# Patient Record
Sex: Female | Born: 1975 | Race: Black or African American | Hispanic: No | Marital: Single | Smoking: Current every day smoker
Health system: Southern US, Community
[De-identification: ages and names within clinical notes are randomized; demographics above are authoritative.]

## PROBLEM LIST (undated history)

## (undated) DIAGNOSIS — G43909 Migraine, unspecified, not intractable, without status migrainosus: Secondary | ICD-10-CM

## (undated) DIAGNOSIS — N879 Dysplasia of cervix uteri, unspecified: Secondary | ICD-10-CM

## (undated) DIAGNOSIS — E669 Obesity, unspecified: Secondary | ICD-10-CM

## (undated) DIAGNOSIS — G4730 Sleep apnea, unspecified: Secondary | ICD-10-CM

## (undated) DIAGNOSIS — F32A Depression, unspecified: Secondary | ICD-10-CM

## (undated) HISTORY — DX: Depression, unspecified: F32.A

## (undated) HISTORY — DX: Dysplasia of cervix uteri, unspecified: N87.9

## (undated) HISTORY — DX: Migraine, unspecified, not intractable, without status migrainosus: G43.909

## (undated) HISTORY — PX: COLPOSCOPY: SHX161

---
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
## 1997-04-10 ENCOUNTER — Encounter (INDEPENDENT_AMBULATORY_CARE_PROVIDER_SITE_OTHER): Payer: Self-pay | Admitting: *Deleted

## 1997-11-15 ENCOUNTER — Encounter: Admission: RE | Admit: 1997-11-15 | Discharge: 1997-11-15 | Payer: Self-pay | Admitting: Family Medicine

## 1997-12-20 ENCOUNTER — Encounter: Admission: RE | Admit: 1997-12-20 | Discharge: 1997-12-20 | Payer: Self-pay | Admitting: Family Medicine

## 1998-01-06 ENCOUNTER — Encounter: Admission: RE | Admit: 1998-01-06 | Discharge: 1998-01-06 | Payer: Self-pay | Admitting: Family Medicine

## 1998-01-11 ENCOUNTER — Encounter: Admission: RE | Admit: 1998-01-11 | Discharge: 1998-01-11 | Payer: Self-pay | Admitting: Family Medicine

## 1998-02-22 ENCOUNTER — Encounter: Admission: RE | Admit: 1998-02-22 | Discharge: 1998-02-22 | Payer: Self-pay | Admitting: *Deleted

## 1998-02-22 ENCOUNTER — Encounter: Payer: Self-pay | Admitting: Emergency Medicine

## 1998-02-22 ENCOUNTER — Emergency Department (HOSPITAL_COMMUNITY): Admission: EM | Admit: 1998-02-22 | Discharge: 1998-02-22 | Payer: Self-pay | Admitting: Emergency Medicine

## 1998-02-23 ENCOUNTER — Encounter: Admission: RE | Admit: 1998-02-23 | Discharge: 1998-05-24 | Payer: Self-pay | Admitting: Internal Medicine

## 1998-07-19 ENCOUNTER — Encounter: Admission: RE | Admit: 1998-07-19 | Discharge: 1998-07-19 | Payer: Self-pay | Admitting: Family Medicine

## 1998-08-02 ENCOUNTER — Encounter: Admission: RE | Admit: 1998-08-02 | Discharge: 1998-08-02 | Payer: Self-pay | Admitting: Family Medicine

## 1998-08-16 ENCOUNTER — Encounter: Admission: RE | Admit: 1998-08-16 | Discharge: 1998-08-16 | Payer: Self-pay | Admitting: Family Medicine

## 1998-09-13 ENCOUNTER — Encounter: Admission: RE | Admit: 1998-09-13 | Discharge: 1998-09-13 | Payer: Self-pay | Admitting: Family Medicine

## 1998-11-13 ENCOUNTER — Encounter: Payer: Self-pay | Admitting: Emergency Medicine

## 1998-11-13 ENCOUNTER — Emergency Department (HOSPITAL_COMMUNITY): Admission: EM | Admit: 1998-11-13 | Discharge: 1998-11-13 | Payer: Self-pay | Admitting: Emergency Medicine

## 1999-04-16 ENCOUNTER — Encounter: Admission: RE | Admit: 1999-04-16 | Discharge: 1999-04-16 | Payer: Self-pay | Admitting: Family Medicine

## 1999-04-26 ENCOUNTER — Encounter: Admission: RE | Admit: 1999-04-26 | Discharge: 1999-04-26 | Payer: Self-pay | Admitting: Family Medicine

## 1999-05-17 ENCOUNTER — Inpatient Hospital Stay (HOSPITAL_COMMUNITY): Admission: AD | Admit: 1999-05-17 | Discharge: 1999-05-17 | Payer: Self-pay | Admitting: Obstetrics & Gynecology

## 1999-05-22 ENCOUNTER — Inpatient Hospital Stay (HOSPITAL_COMMUNITY): Admission: AD | Admit: 1999-05-22 | Discharge: 1999-05-22 | Payer: Self-pay | Admitting: *Deleted

## 1999-05-22 ENCOUNTER — Encounter: Payer: Self-pay | Admitting: *Deleted

## 1999-06-12 ENCOUNTER — Emergency Department (HOSPITAL_COMMUNITY): Admission: EM | Admit: 1999-06-12 | Discharge: 1999-06-12 | Payer: Self-pay | Admitting: Emergency Medicine

## 2000-02-07 ENCOUNTER — Encounter: Payer: Self-pay | Admitting: Emergency Medicine

## 2000-02-07 ENCOUNTER — Emergency Department (HOSPITAL_COMMUNITY): Admission: EM | Admit: 2000-02-07 | Discharge: 2000-02-07 | Payer: Self-pay | Admitting: Emergency Medicine

## 2000-03-17 ENCOUNTER — Emergency Department (HOSPITAL_COMMUNITY): Admission: EM | Admit: 2000-03-17 | Discharge: 2000-03-17 | Payer: Self-pay | Admitting: *Deleted

## 2000-09-01 ENCOUNTER — Emergency Department (HOSPITAL_COMMUNITY): Admission: EM | Admit: 2000-09-01 | Discharge: 2000-09-01 | Payer: Self-pay | Admitting: Emergency Medicine

## 2000-11-20 ENCOUNTER — Encounter: Admission: RE | Admit: 2000-11-20 | Discharge: 2000-11-20 | Payer: Self-pay | Admitting: Family Medicine

## 2001-04-20 ENCOUNTER — Emergency Department (HOSPITAL_COMMUNITY): Admission: EM | Admit: 2001-04-20 | Discharge: 2001-04-20 | Payer: Self-pay | Admitting: *Deleted

## 2001-04-20 ENCOUNTER — Encounter: Payer: Self-pay | Admitting: Emergency Medicine

## 2001-04-28 ENCOUNTER — Emergency Department (HOSPITAL_COMMUNITY): Admission: EM | Admit: 2001-04-28 | Discharge: 2001-04-28 | Payer: Self-pay

## 2001-05-01 ENCOUNTER — Other Ambulatory Visit: Admission: RE | Admit: 2001-05-01 | Discharge: 2001-05-01 | Payer: Self-pay | Admitting: Family Medicine

## 2001-06-15 ENCOUNTER — Encounter: Admission: RE | Admit: 2001-06-15 | Discharge: 2001-06-15 | Payer: Self-pay | Admitting: Family Medicine

## 2001-12-10 ENCOUNTER — Encounter: Admission: RE | Admit: 2001-12-10 | Discharge: 2001-12-10 | Payer: Self-pay | Admitting: Family Medicine

## 2002-01-12 ENCOUNTER — Encounter: Admission: RE | Admit: 2002-01-12 | Discharge: 2002-01-12 | Payer: Self-pay | Admitting: Sports Medicine

## 2002-07-16 ENCOUNTER — Encounter: Admission: RE | Admit: 2002-07-16 | Discharge: 2002-07-16 | Payer: Self-pay | Admitting: Family Medicine

## 2003-06-12 ENCOUNTER — Inpatient Hospital Stay (HOSPITAL_COMMUNITY): Admission: AD | Admit: 2003-06-12 | Discharge: 2003-06-12 | Payer: Self-pay | Admitting: *Deleted

## 2003-09-03 ENCOUNTER — Inpatient Hospital Stay (HOSPITAL_COMMUNITY): Admission: AD | Admit: 2003-09-03 | Discharge: 2003-09-03 | Payer: Self-pay | Admitting: Obstetrics and Gynecology

## 2003-09-13 ENCOUNTER — Ambulatory Visit (HOSPITAL_COMMUNITY): Admission: RE | Admit: 2003-09-13 | Discharge: 2003-09-13 | Payer: Self-pay | Admitting: Obstetrics & Gynecology

## 2003-09-15 ENCOUNTER — Inpatient Hospital Stay (HOSPITAL_COMMUNITY): Admission: AD | Admit: 2003-09-15 | Discharge: 2003-09-15 | Payer: Self-pay | Admitting: Obstetrics & Gynecology

## 2003-09-26 ENCOUNTER — Inpatient Hospital Stay (HOSPITAL_COMMUNITY): Admission: AD | Admit: 2003-09-26 | Discharge: 2003-09-26 | Payer: Self-pay | Admitting: Obstetrics and Gynecology

## 2003-10-18 ENCOUNTER — Inpatient Hospital Stay (HOSPITAL_COMMUNITY): Admission: AD | Admit: 2003-10-18 | Discharge: 2003-10-21 | Payer: Self-pay | Admitting: Obstetrics

## 2003-11-03 ENCOUNTER — Inpatient Hospital Stay (HOSPITAL_COMMUNITY): Admission: RE | Admit: 2003-11-03 | Discharge: 2003-11-06 | Payer: Self-pay | Admitting: Obstetrics

## 2003-11-03 ENCOUNTER — Encounter (INDEPENDENT_AMBULATORY_CARE_PROVIDER_SITE_OTHER): Payer: Self-pay | Admitting: Specialist

## 2004-06-10 HISTORY — PX: TUBAL LIGATION: SHX77

## 2005-06-10 HISTORY — PX: UMBILICAL HERNIA REPAIR: SHX196

## 2005-07-21 ENCOUNTER — Emergency Department (HOSPITAL_COMMUNITY): Admission: EM | Admit: 2005-07-21 | Discharge: 2005-07-21 | Payer: Self-pay | Admitting: *Deleted

## 2005-12-22 ENCOUNTER — Emergency Department (HOSPITAL_COMMUNITY): Admission: EM | Admit: 2005-12-22 | Discharge: 2005-12-22 | Payer: Self-pay | Admitting: Family Medicine

## 2006-01-16 ENCOUNTER — Emergency Department (HOSPITAL_COMMUNITY): Admission: EM | Admit: 2006-01-16 | Discharge: 2006-01-16 | Payer: Self-pay | Admitting: Emergency Medicine

## 2006-03-25 ENCOUNTER — Emergency Department (HOSPITAL_COMMUNITY): Admission: EM | Admit: 2006-03-25 | Discharge: 2006-03-25 | Payer: Self-pay | Admitting: Family Medicine

## 2006-03-27 ENCOUNTER — Ambulatory Visit: Payer: Self-pay | Admitting: Family Medicine

## 2006-04-16 ENCOUNTER — Ambulatory Visit (HOSPITAL_COMMUNITY): Admission: RE | Admit: 2006-04-16 | Discharge: 2006-04-16 | Payer: Self-pay | Admitting: General Surgery

## 2006-05-07 ENCOUNTER — Inpatient Hospital Stay (HOSPITAL_COMMUNITY): Admission: RE | Admit: 2006-05-07 | Discharge: 2006-05-09 | Payer: Self-pay | Admitting: *Deleted

## 2006-08-07 DIAGNOSIS — F17200 Nicotine dependence, unspecified, uncomplicated: Secondary | ICD-10-CM | POA: Insufficient documentation

## 2006-08-07 DIAGNOSIS — G43909 Migraine, unspecified, not intractable, without status migrainosus: Secondary | ICD-10-CM | POA: Insufficient documentation

## 2006-08-07 DIAGNOSIS — J309 Allergic rhinitis, unspecified: Secondary | ICD-10-CM | POA: Insufficient documentation

## 2006-08-08 ENCOUNTER — Encounter (INDEPENDENT_AMBULATORY_CARE_PROVIDER_SITE_OTHER): Payer: Self-pay | Admitting: *Deleted

## 2006-08-15 ENCOUNTER — Telehealth: Payer: Self-pay | Admitting: Family Medicine

## 2006-08-26 ENCOUNTER — Emergency Department (HOSPITAL_COMMUNITY): Admission: EM | Admit: 2006-08-26 | Discharge: 2006-08-26 | Payer: Self-pay | Admitting: Emergency Medicine

## 2006-11-15 ENCOUNTER — Emergency Department (HOSPITAL_COMMUNITY): Admission: EM | Admit: 2006-11-15 | Discharge: 2006-11-15 | Payer: Self-pay | Admitting: Family Medicine

## 2006-11-17 ENCOUNTER — Telehealth: Payer: Self-pay | Admitting: *Deleted

## 2006-11-19 ENCOUNTER — Ambulatory Visit: Payer: Self-pay | Admitting: Family Medicine

## 2006-11-19 ENCOUNTER — Telehealth: Payer: Self-pay | Admitting: *Deleted

## 2007-02-12 ENCOUNTER — Emergency Department (HOSPITAL_COMMUNITY): Admission: EM | Admit: 2007-02-12 | Discharge: 2007-02-12 | Payer: Self-pay | Admitting: Emergency Medicine

## 2007-08-15 ENCOUNTER — Emergency Department (HOSPITAL_COMMUNITY): Admission: EM | Admit: 2007-08-15 | Discharge: 2007-08-15 | Payer: Self-pay | Admitting: Family Medicine

## 2007-08-15 ENCOUNTER — Telehealth (INDEPENDENT_AMBULATORY_CARE_PROVIDER_SITE_OTHER): Payer: Self-pay | Admitting: *Deleted

## 2007-08-20 ENCOUNTER — Telehealth: Payer: Self-pay | Admitting: *Deleted

## 2007-08-20 ENCOUNTER — Ambulatory Visit: Payer: Self-pay | Admitting: Sports Medicine

## 2007-08-20 ENCOUNTER — Encounter: Payer: Self-pay | Admitting: Family Medicine

## 2007-08-20 LAB — CONVERTED CEMR LAB: Chlamydia, Swab/Urine, PCR: NEGATIVE

## 2007-08-24 ENCOUNTER — Ambulatory Visit: Payer: Self-pay | Admitting: Family Medicine

## 2007-08-24 ENCOUNTER — Telehealth: Payer: Self-pay | Admitting: *Deleted

## 2007-08-24 LAB — CONVERTED CEMR LAB
Ketones, urine, test strip: NEGATIVE
Nitrite: NEGATIVE
Protein, U semiquant: 30
Urobilinogen, UA: 1
pH: 6.5

## 2007-12-14 ENCOUNTER — Emergency Department (HOSPITAL_COMMUNITY): Admission: EM | Admit: 2007-12-14 | Discharge: 2007-12-14 | Payer: Self-pay | Admitting: Emergency Medicine

## 2008-05-01 ENCOUNTER — Emergency Department (HOSPITAL_COMMUNITY): Admission: EM | Admit: 2008-05-01 | Discharge: 2008-05-01 | Payer: Self-pay | Admitting: Emergency Medicine

## 2008-11-22 ENCOUNTER — Emergency Department (HOSPITAL_COMMUNITY): Admission: EM | Admit: 2008-11-22 | Discharge: 2008-11-22 | Payer: Self-pay | Admitting: Emergency Medicine

## 2010-05-31 ENCOUNTER — Encounter: Payer: Self-pay | Admitting: Family Medicine

## 2010-07-12 NOTE — Miscellaneous (Signed)
   Clinical Lists Changes  Problems: Removed problem of TRICHOMONAL INFECTION (ICD-131.9) Removed problem of ABDOMINAL PAIN (ICD-789.00) Removed problem of LOW BACK PAIN, ACUTE (ICD-724.2) Removed problem of UNSPECIFIED UROGENITAL TRICHOMONIASIS (ICD-131.00) Removed problem of UTI (ICD-599.0) Removed problem of SEXUALLY TRANSMITTED DISEASE, EXPOSURE TO (ICD-V01.6) Removed problem of DYSURIA (ICD-788.1)

## 2010-08-01 ENCOUNTER — Encounter: Payer: Self-pay | Admitting: *Deleted

## 2010-09-17 LAB — POCT CARDIAC MARKERS
CKMB, poc: 1.7 ng/mL (ref 1.0–8.0)
Troponin i, poc: <0.05 ng/mL (ref 0.00–0.09)
Troponin i, poc: <0.05 ng/mL (ref 0.00–0.09)

## 2010-09-17 LAB — POCT I-STAT, CHEM 8
BUN: 7 mg/dL (ref 6–23)
Glucose, Bld: 92 mg/dL (ref 70–99)
HCT: 36 % (ref 36.0–46.0)
Potassium: 4 mEq/L (ref 3.5–5.1)
Sodium: 140 mEq/L (ref 135–145)

## 2010-10-08 ENCOUNTER — Inpatient Hospital Stay (INDEPENDENT_AMBULATORY_CARE_PROVIDER_SITE_OTHER)
Admission: RE | Admit: 2010-10-08 | Discharge: 2010-10-08 | Disposition: A | Discharge status: Home / Self Care | Payer: Worker's Compensation | Source: Ambulatory Visit | Attending: Family Medicine | Admitting: Family Medicine

## 2010-10-08 ENCOUNTER — Ambulatory Visit (INDEPENDENT_AMBULATORY_CARE_PROVIDER_SITE_OTHER): Payer: Worker's Compensation

## 2010-10-08 DIAGNOSIS — S8000XA Contusion of unspecified knee, initial encounter: Secondary | ICD-10-CM

## 2010-10-08 DIAGNOSIS — R609 Edema, unspecified: Secondary | ICD-10-CM

## 2010-10-26 NOTE — Discharge Summary (Signed)
NAME:  [name], [name]                          ACCOUNT NO.:  [id number]   MEDICAL RECORD NO.:  [id number]                   PATIENT TYPE:  INP   LOCATION:  9157                                 FACILITY:  [facility]   PHYSICIAN:  Charles A. Clearance Coots, M.D.             DATE OF BIRTH:  07-Mar-1976   DATE OF ADMISSION:  10/18/2003  DATE OF DISCHARGE:  10/21/2003                                 DISCHARGE SUMMARY   ADMISSION DATE:  Oct 18, 2003   ATTENDING PHYSICIAN:  Dr. Coral Ceo.   ADMISSION DIAGNOSES:  Thirty-six weeks gestation with chest pain, probable  musculoskeletal etiology.   DISCHARGE DATE:  Oct 21, 2003   ATTENDING PHYSICIAN ON DAY OF DISCHARGE:  Dr. Coral Ceo.   DISCHARGE DIAGNOSES:  1. Thirty-six weeks gestation.  2. Resolved chest pain.  3. Gallbladder etiology versus musculoskeletal pain.   PATIENT PRESENTATION:  The patient presents to the maternity admissions unit  with complaint of back pain and mid chest pain.  The patient had been seen  earlier for a maternity admissions visit for the same symptoms and received  treatment with a GI cocktail and resolution, with now return of symptoms and  pain.  Physical examination:  Lungs are clear, heart rate is regular rate  and rhythm.  Abdomen is soft, gravid, and nontender.  EKG reveals normal  sinus rhythm with no ectopy.  The patient is admitted for observation and  supportive care.   PATIENT HISTORY:  The patient is a 35 year old gravida 5 para 2 with an EDC  of November 12, 2003 at [redacted] weeks gestation.  The patient has been receiving  prenatal care at the [facility].  Prenatal history includes  cesarean delivery x2 for two term pregnancies.  The patient has a history of  acid reflux disease and constipation.  The patient has also undergone  cryotherapy for abnormal Pap smears.  The patient has no known drug  allergies.  The patient's current medications include Nexium 40 mg daily,  Ambien 10 mg nightly as  needed, and a prenatal vitamin one tablet daily.   HOSPITAL COURSE:  Hospital admission on day #1 includes lab work and  supportive care.  EKG is within normal sinus rhythm.  Lab work on the day of  admission:  Hemoglobin is 9.3, hematocrit 27.7, platelet count of 250, white  blood cell count of 7.5.  The patient had CMET which was within normal  limits.  Repeat lab work on hospital day #2 with repeat CMET, amylase, and  lipase.  CMET is still within normal limits with liver enzymes normal,  amylase is within  normal limits and lipase is within normal limits.  The  patient had a normal urinalysis.  The patient underwent obstetrical  ultrasound which was within normal limits with a gross estimate of 25th to  50th percentile, normal fluid, good cardiac activity, with a cephalic  presentation and  no evidence of labor.  The patient was continued on Nexium  was given Tylox p.r.n. for pain.  The patient underwent a gallbladder  ultrasound on hospital day #2 which was within normal limits.  The patient  had nonstress tests during hospitalization which revealed a reactive  nonstress test.  Hospital day of discharge - Oct 21, 2003 - the patient is  without pain or vomiting x24 hours.  The patient is tolerating p.o. diet  without any difficulty.  The patient is discharged home in stable condition  with resolved pain.   DISCHARGE INSTRUCTIONS:  1. Continue on a low fat at home.  2. Routine medications are to be continued of the prenatal vitamin once     daily, Nexium 40 mg daily.  3. Activity is ad lib.  4. The patient is to follow up with the [facility] in 1 week as     scheduled or to contact our office with any return of symptoms prior to     follow-up visit.     Marlinda Mike, C.N.M.                      Charles A. Clearance Coots, M.D.    TB/MEDQ  D:  11/30/2003  T:  12/02/2003  Job:  [id number]

## 2010-10-26 NOTE — Op Note (Signed)
[name], [name]                ACCOUNT NO.:  [id number]   MEDICAL RECORD NO.:  [id number]          PATIENT TYPE:  OIB   LOCATION:  9629                         FACILITY:  [facility]   PHYSICIAN:  Alfonse Ras, MD   DATE OF BIRTH:  1975/12/09   DATE OF PROCEDURE:  DATE OF DISCHARGE:                               OPERATIVE REPORT   PREOPERATIVE DIAGNOSIS:  Incisional ventral hernia.   POSTOPERATIVE DIAGNOSIS:  Incisional ventral hernia with extensive intra-  abdominal adhesions.   PROCEDURE:  Laparoscopic assisted ventral hernia repair with open  placement of mesh.   SURGEON:  Alfonse Ras, M.D.   ASSISTANT:  Gita Kudo, M.D.   ANESTHESIA:  General.   DESCRIPTION:  The patient was taken operating room, placed in supine  position. After adequate anesthesia was induced using endotracheal tube  the abdomen is prepped and draped in normal sterile fashion. Using a  left upper quadrant 11 mm incision, trocar was placed under direct  vision.  Pneumoperitoneum was obtained under direct vision.  Additional  5 mm trocars were placed under direct vision.  A number of omental and  small bowel adhesions were taken down from the anterior abdominal wall  and reduced through the sac.  There was one area of what appeared to be  terminal ileum which was quite plastered to the pubic tubercle.  I felt  that was unsafe to dissect.  For that reason, I could not place intra-  abdominal tacks and after all adhesions were taken down except for the  one against the pelvic rim, I opted to open over the hernia sac.  The  hernia sac was excised, fascial edges were identified and flaps were  made around them and they were closed with interrupted figure-of-eight  #1 Novofil sutures.  A piece of onlay Bard mesh was then placed intact  using running 2-0 Prolene suture extending 2-3 cm away from the primary  repair.  A JP drain was placed because of the patient's size and  significant risk for  seroma.  Trocars were removed and skin incisions  were closed with staples.  The patient tolerated the procedure well and  went to PACU in good condition.      Alfonse Ras, MD  Electronically Signed     KRE/MEDQ  D:  05/06/2006  T:  05/06/2006  Job:  [id number]

## 2010-10-26 NOTE — Op Note (Signed)
[name], [name]                          ACCOUNT NO.:  [id number]   MEDICAL RECORD NO.:  [id number]                   PATIENT TYPE:  INP   LOCATION:  9116                                 FACILITY:  [facility]   PHYSICIAN:  Charles A. Clearance Coots, M.D.             DATE OF BIRTH:  1975/10/06   DATE OF PROCEDURE:  11/03/2003  DATE OF DISCHARGE:                                 OPERATIVE REPORT   PREOPERATIVE DIAGNOSIS:  Previous cesarean section x 2, desires repeat  cesarean section.   POSTOPERATIVE DIAGNOSIS:  Previous cesarean section x 2, desires repeat  cesarean section.   PROCEDURE:  Repeat low transverse cesarean section.   SURGEON:  Charles A. Clearance Coots, M.D.   ASSISTANT:  Kathreen Cosier, M.D.   ANESTHESIA:  Spinal.   ESTIMATED BLOOD LOSS:  1100 mL.   FLUIDS REPLACED:  5000 mL.   URINE OUTPUT:  150 mL clear.   COMPLICATIONS:  None.   DRAINS:  Foley catheter to gravity.   FINDINGS:  Viable female at 0920, Apgars 9 at 1 minute and 9 at 5 minutes,  weight 5 pounds 13 ounces.  Normal uterus, ovaries, and fallopian tubes.   OPERATION:  The patient was brought to the operating room and after  satisfactory spinal anesthesia, the abdomen was prepped and draped in the  usual sterile fashion.  A Pfannenstiel skin incision was made with the  scalpel through the previous scar down to the fascia.  The fascia was nicked  in the midline and the fascial incision was extended to the left and the  right with curved Mayo scissors.  The superior and inferior fascial edges  were taken off the rectus muscles with blunt and sharp dissection.  The  rectus muscles were bluntly and sharply divided in the midline.  The  peritoneum was entered digitally and was digitally extended to the left and  the right.  The bladder blade was positioned and the vesicouterine fold of  peritoneum above the reflection of the urinary bladder was grasped with  forceps and was incised and undermined with Metzenbaum  scissors.  The  incision was extended to the left and to the right with Metzenbaum scissors.  The bladder flap was bluntly developed and the bladder blade was  repositioned from the urinary bladder placing it well out of the operative  field.  The uterus was entered transversely in the lower uterine segment  with a scalpel down to the amniotic sac.  The uterine incision was extended  to the left and to the right with bandage scissors.  The amniotic sac was  ruptured and clear fluid was expelled.  The vertex was noted to be  hyperextended and was, therefore, flexed into the incision but still not  delivered.  A mushroom Mityvac was then used for vacuum extraction and the  mushroom cup was applied to the occiput and the occiput was further flexed  into  the incision and the delivery was completed with the aid of fundal  pressure from the assistant.  The infant's mouth and nose were suctioned  with a suction bulb and the remainder of the delivery was completed with the  aid of fundal pressure from the assistant.  The umbilical cord was doubly  clamped and cut and the infant was handed off the nursery staff.  Cord blood  was obtained and the placenta was spontaneously expelled from the uterine  cavity intact.  The endometrial surface was thoroughly debrided with a dry  lap sponge and the edges of the uterine incision were grasped with ring  forceps.  The uterus was closed with a continuous interlocking suture of 0  Monocryl single layer.  Hemostasis was excellent.  The pelvic cavity was  thoroughly irrigated with warm saline solution and all clots were removed.  The abdomen was then closed as follows:  The peritoneum was closed with a  continuous suture of 2-0 Monocryl.  The fascia was closed with continuous  suture of 0 Vicryl from each corner to the center.  The subcutaneous tissue  was thoroughly irrigated with warm, saline solution and all areas of  subcutaneous bleeding was coagulated with  the Bovie.  The subcutaneous  tissue was reapproximated with continuous suture of 2-0 Monocryl.  The skin  was then closed with a continuous subcuticular suture of 3-0 Monocryl.  A  sterile bandage was applied to the incision closure.  The surgical  technician indicated that all needle, sponge, and instrument counts were  correct x 2.  The patient tolerated the procedure well and was transferred  to the recovery room in satisfactory condition.                                               Charles A. Clearance Coots, M.D.    CAH/MEDQ  D:  11/03/2003  T:  11/03/2003  Job:  [id number]

## 2010-10-26 NOTE — Discharge Summary (Signed)
[name], [name]                          ACCOUNT NO.:  [id number]   MEDICAL RECORD NO.:  [id number]                   PATIENT TYPE:  INP   LOCATION:  9116                                 FACILITY:  [facility]   PHYSICIAN:  Charles A. Clearance Coots, M.D.             DATE OF BIRTH:  1976-05-23   DATE OF ADMISSION:  11/03/2003  DATE OF DISCHARGE:                                 DISCHARGE SUMMARY   ADMITTING DIAGNOSIS:  Previous cesarean section x2, desired repeat cesarean  section.   DISCHARGE DIAGNOSES:  1. Previous cesarean section x2, desired repeat cesarean section.  2. Status post repeat low transverse cesarean section.  Delivered a viable     female on Nov 03, 2003 at 0920; Apgars of 9 at one minute and 9 at five     minutes; weight of 5 pounds 13 ounces.  Mother and infant discharged home     in good condition.   REASON FOR ADMISSION:  A 35 year old black female G5 P2-0-2-2 with estimated  date of confinement of November 12, 2003.  History of two previous cesarean  sections; desired repeat cesarean section.   PAST MEDICAL HISTORY:  Surgery:  Cesarean section x2.  Illnesses:  None.   MEDICATIONS:  Prenatal vitamins and iron.   ALLERGIES:  No known drug allergies.   SOCIAL HISTORY:  Single.  Negative tobacco, alcohol, or recreational drug  use.   FAMILY HISTORY:  Noncontributory.   PHYSICAL EXAMINATION:  GENERAL APPEARANCE:  Obese black female in no acute  distress.  VITAL SIGNS:  Afebrile, vital signs are stable.  LUNGS:  Clear to auscultation bilaterally.  HEART:  Regular rate and rhythm.  ABDOMEN:  Obese, gravid, nontender.  PELVIC:  Normal external female genitalia, vaginal mucosa normal.  The  cervix is long and closed.  EXTREMITIES:  Without cyanosis, clubbing, or edema.   ADMITTING LABORATORY VALUES:  Hemoglobin 9.9; hematocrit 29.9; white blood  cell count 7800; platelets 269,000.  Hepatitis B surface antigen was  negative.  RPR was nonreactive.  Rubella immune.   HOSPITAL COURSE:  The patient underwent a repeat low transverse cesarean  section on Nov 03, 2003.  There were no intraoperative complications.  Postpartum and postoperative course were uncomplicated.  The patient did  have anemia postpartum but did not have any orthostatic changes.  Her  baseline hemoglobin was low and the patient had had anemia during her  pregnancy.  She was therefore discharged home on postoperative day #3 in  good condition.   DISCHARGE LABORATORY VALUES:  Hemoglobin 6.9, hematocrit 21.   DISCHARGE DISPOSITION:  1. Medications:  Tylox and ibuprofen prescribed for pain; continue prenatal     vitamins and iron.  2. Routine written obstetrical discharge instructions were given per     booklet.  3. The patient is to call the office for follow-up in 6 weeks.  Charles A. Clearance Coots, M.D.    CAH/MEDQ  D:  11/06/2003  T:  11/06/2003  Job:  [id number]

## 2011-03-04 LAB — POCT URINALYSIS DIP (DEVICE)
Glucose, UA: 100 — AB
Protein, ur: 100 — AB
Urobilinogen, UA: 2 — ABNORMAL HIGH
pH: 5.5

## 2011-03-04 LAB — POCT PREGNANCY, URINE
Operator id: 247071
Preg Test, Ur: NEGATIVE

## 2011-03-04 LAB — URINE CULTURE

## 2011-07-15 ENCOUNTER — Emergency Department (HOSPITAL_COMMUNITY)
Admission: EM | Admit: 2011-07-15 | Discharge: 2011-07-16 | Disposition: A | Discharge status: Home / Self Care | Payer: 59 | Attending: Emergency Medicine | Admitting: Emergency Medicine

## 2011-07-15 ENCOUNTER — Encounter (HOSPITAL_COMMUNITY): Payer: Self-pay | Admitting: Emergency Medicine

## 2011-07-15 DIAGNOSIS — R42 Dizziness and giddiness: Secondary | ICD-10-CM | POA: Insufficient documentation

## 2011-07-15 DIAGNOSIS — R110 Nausea: Secondary | ICD-10-CM | POA: Insufficient documentation

## 2011-07-15 DIAGNOSIS — F17200 Nicotine dependence, unspecified, uncomplicated: Secondary | ICD-10-CM | POA: Insufficient documentation

## 2011-07-15 DIAGNOSIS — J329 Chronic sinusitis, unspecified: Secondary | ICD-10-CM | POA: Insufficient documentation

## 2011-07-15 DIAGNOSIS — R51 Headache: Secondary | ICD-10-CM | POA: Insufficient documentation

## 2011-07-15 HISTORY — DX: Obesity, unspecified: E66.9

## 2011-07-15 NOTE — ED Notes (Signed)
PT. REPORTS PROGRESSING HEADACHE FOR 3 WEEKS UNRELIEVED BY OTC PAIN MEDICATIONS.

## 2011-07-16 ENCOUNTER — Emergency Department (HOSPITAL_COMMUNITY): Payer: 59

## 2011-07-16 MED ORDER — DEXAMETHASONE SODIUM PHOSPHATE 10 MG/ML IJ SOLN
10.0000 mg | Freq: Once | INTRAMUSCULAR | Status: AC
  Administered 2011-07-16: 10 mg via INTRAMUSCULAR
  Filled 2011-07-16: qty 1

## 2011-07-16 MED ORDER — KETOROLAC TROMETHAMINE 60 MG/2ML IM SOLN
60.0000 mg | Freq: Once | INTRAMUSCULAR | Status: AC
  Administered 2011-07-16: 60 mg via INTRAMUSCULAR
  Filled 2011-07-16: qty 2

## 2011-07-16 MED ORDER — PSEUDOEPHEDRINE HCL 30 MG PO TABS: 30.0000 mg | ORAL_TABLET | ORAL | Status: AC | PRN

## 2011-07-16 NOTE — ED Provider Notes (Signed)
Medical screening examination/treatment/procedure(s) were performed by non-physician practitioner and as supervising physician I was immediately available for consultation/collaboration.   Juliet Rude. Rubin Payor, MD 07/16/11 [phone]

## 2011-07-16 NOTE — ED Provider Notes (Signed)
History     [name]: [id number]  Arrival date & time 07/15/11  2305   First MD Initiated Contact with Patient 07/16/11 0028      Chief Complaint  Patient presents with  . Migraine    (Consider location/radiation/quality/duration/timing/severity/associated sxs/prior treatment) HPI Comments: Patient here with headache for the past 3 weeks - she reports that the pain has been constant - that it is across her entire forehead, worsening with bright lights, reports no blurring of vision, nausea or vomiting - denies fever, chills, shortness of breath, chest pain.  States that her sister died recently and all she complained about was headaches, though she does not know how her sister died.  She has tried OTC medications without relief.  Patient is a 36 y.o. female presenting with migraine. The history is provided by the patient. No language interpreter was used.  Migraine This is a new problem. The current episode started 1 to 4 weeks ago. The problem occurs constantly. The problem has been unchanged. Associated symptoms include headaches. Pertinent negatives include no abdominal pain, anorexia, arthralgias, chest pain, chills, congestion, coughing, diaphoresis, fatigue, fever, joint swelling, myalgias, nausea, neck pain, numbness, rash, sore throat, vertigo, visual change, vomiting or weakness. Exacerbated by: bright light. She has tried acetaminophen and NSAIDs for the symptoms. The treatment provided no relief.    Past Medical History  Diagnosis Date  . Obesity     Past Surgical History  Procedure Date  . Cesarean section     No family history on file.  History  Substance Use Topics  . Smoking status: Current Everyday Smoker  . Smokeless tobacco: Not on file  . Alcohol Use: No    OB History    Grav Para Term Preterm Abortions TAB SAB Ect Mult Living                  Review of Systems  Constitutional: Negative for fever, chills, diaphoresis and fatigue.  HENT: Negative for  congestion, sore throat and neck pain.   Respiratory: Negative for cough.   Cardiovascular: Negative for chest pain.  Gastrointestinal: Negative for nausea, vomiting, abdominal pain and anorexia.  Musculoskeletal: Negative for myalgias, joint swelling and arthralgias.  Skin: Negative for rash.  Neurological: Positive for headaches. Negative for vertigo, weakness and numbness.  All other systems reviewed and are negative.    Allergies  Morphine and related  Home Medications   Current Outpatient Rx  Name Route Sig Dispense Refill  . ADULT MULTIVITAMIN W/MINERALS CH Oral Take 1 tablet by mouth daily.      BP 100/64  Pulse 83  Temp(Src) 98.3 F (36.8 C) (Oral)  Resp 20  SpO2 100%  LMP 07/14/2011  Physical Exam  Nursing note and vitals reviewed. Constitutional: She is oriented to person, place, and time. She appears well-developed and well-nourished. No distress.  HENT:  Head: Normocephalic and atraumatic.  Right Ear: External ear normal.  Left Ear: External ear normal.  Nose: Nose normal.  Mouth/Throat: Oropharynx is clear and moist. No oropharyngeal exudate.  Eyes: Conjunctivae are normal. Pupils are equal, round, and reactive to light. No scleral icterus.  Neck: Normal range of motion. Neck supple.  Cardiovascular: Normal rate, regular rhythm and normal heart sounds.  Exam reveals no gallop and no friction rub.   No murmur heard. Pulmonary/Chest: Effort normal and breath sounds normal. No respiratory distress. She exhibits no tenderness.  Abdominal: Soft. Bowel sounds are normal. She exhibits no distension. There is no tenderness.  Musculoskeletal: Normal  range of motion. She exhibits no edema and no tenderness.  Lymphadenopathy:    She has no cervical adenopathy.  Neurological: She is alert and oriented to person, place, and time. She has normal reflexes. No cranial nerve deficit. She exhibits normal muscle tone. Coordination normal.  Skin: Skin is warm and dry. No rash  noted. No erythema. No pallor.  Psychiatric: She has a normal mood and affect. Her behavior is normal. Judgment and thought content normal.    ED Course  Procedures (including critical care time)  Labs Reviewed - No data to display No results found. Results for orders placed during the hospital encounter of 11/22/08  POCT I-STAT, CHEM 8      Component Value Range   Sodium 140  135 - 145 (mEq/L)   Potassium 4.0  3.5 - 5.1 (mEq/L)   Chloride 106  96 - 112 (mEq/L)   BUN 7  6 - 23 (mg/dL)   Creatinine, Ser 0.9  0.4 - 1.2 (mg/dL)   Glucose, Bld 92  70 - 99 (mg/dL)   Calcium, Ion 4.09 (*) 1.12 - 1.32 (mmol/L)   TCO2 22  0 - 100 (mmol/L)   Hemoglobin 12.2  12.0 - 15.0 (g/dL)   HCT 81.1  91.4 - 78.2 (%)  POCT CARDIAC MARKERS      Component Value Range   Myoglobin, poc 116  12 - 200 (ng/mL)   CKMB, poc 1.3  1.0 - 8.0 (ng/mL)   Troponin i, poc <0.05  0.00 - 0.09 (ng/mL)   Comment       Value:            TROPONIN VALUES IN THE RANGE     OF 0.00-0.09 ng/mL SHOW     NO INDICATION OF     MYOCARDIAL INJURY.                PERSISTENTLY INCREASED TROPONIN     VALUES IN THE RANGE OF 0.10-0.24     ng/mL CAN BE SEEN IN:           -UNSTABLE ANGINA           -CONGESTIVE HEART FAILURE           -MYOCARDITIS           -CHEST TRAUMA           -ARRYHTHMIAS           -LATE PRESENTING MI           -COPD       CLINICAL FOLLOW-UP RECOMMENDED.                TROPONIN VALUES >=0.25 ng/mL     INDICATE POSSIBLE MYOCARDIAL     ISCHEMIA. SERIAL TESTING     RECOMMENDED.  POCT CARDIAC MARKERS      Component Value Range   Myoglobin, poc 125  12 - 200 (ng/mL)   CKMB, poc 1.7  1.0 - 8.0 (ng/mL)   Troponin i, poc <0.05  0.00 - 0.09 (ng/mL)   Comment       Value:            TROPONIN VALUES IN THE RANGE     OF 0.00-0.09 ng/mL SHOW     NO INDICATION OF     MYOCARDIAL INJURY.                PERSISTENTLY INCREASED TROPONIN     VALUES IN THE RANGE OF 0.10-0.24     ng/mL CAN BE  SEEN IN:            -UNSTABLE ANGINA           -CONGESTIVE HEART FAILURE           -MYOCARDITIS           -CHEST TRAUMA           -ARRYHTHMIAS           -LATE PRESENTING MI           -COPD       CLINICAL FOLLOW-UP RECOMMENDED.                TROPONIN VALUES >=0.25 ng/mL     INDICATE POSSIBLE MYOCARDIAL     ISCHEMIA. SERIAL TESTING     RECOMMENDED.   Ct Head Wo Contrast  07/16/2011  *RADIOLOGY REPORT*  Clinical Data: Headache, dizziness, nausea.  CT HEAD WITHOUT CONTRAST  Technique:  Contiguous axial images were obtained from the base of the skull through the vertex without contrast.  Comparison:  None  Findings: There is no evidence for acute hemorrhage, hydrocephalus, mass lesion, or abnormal extra-axial fluid collection.  No definite CT evidence for acute infarction.  Mucosal thickening and partial opacification of the right maxillary sinus.  Associated osseous changes subjects this to be chronic. Otherwise, the visualized paranasal sinuses and mastoid air cells are predominately clear.  IMPRESSION: No acute intracranial abnormality.  Opacified right maxillary sinus is at least in part chronic. Correlate clinically if concerned for a superimposed acute sinusitis.  Original Report Authenticated By: Waneta Martins, M.D.     Headache Chronic sinusitis    MDM  Patient here with a history of chronic headaches beyond the two weeks that she has had this particular headache, there are no neurological findings with this patient, reports pain somewhat eased with medication (toradol and decadron) and with CT showing sinusitis - likely chronic in nature - will place on nasal decongestant and refer the patient to the Headache wellness clinic - I do not suspect any emergent condition.        Izola Price [place], [place] 07/16/11 [phone]

## 2011-07-16 NOTE — ED Notes (Signed)
Pt rates headache a #6 on pain scale 0/10.  St's the lights make her headache worse.

## 2011-08-29 ENCOUNTER — Encounter (HOSPITAL_COMMUNITY): Payer: Self-pay | Admitting: Emergency Medicine

## 2011-08-29 ENCOUNTER — Emergency Department (HOSPITAL_COMMUNITY): Payer: 59

## 2011-08-29 ENCOUNTER — Emergency Department (HOSPITAL_COMMUNITY)
Admission: EM | Admit: 2011-08-29 | Discharge: 2011-08-29 | Disposition: A | Discharge status: Home / Self Care | Payer: 59 | Attending: Emergency Medicine | Admitting: Emergency Medicine

## 2011-08-29 DIAGNOSIS — N390 Urinary tract infection, site not specified: Secondary | ICD-10-CM | POA: Insufficient documentation

## 2011-08-29 DIAGNOSIS — R109 Unspecified abdominal pain: Secondary | ICD-10-CM

## 2011-08-29 DIAGNOSIS — F17200 Nicotine dependence, unspecified, uncomplicated: Secondary | ICD-10-CM | POA: Insufficient documentation

## 2011-08-29 DIAGNOSIS — C801 Malignant (primary) neoplasm, unspecified: Secondary | ICD-10-CM | POA: Diagnosis present

## 2011-08-29 DIAGNOSIS — R1031 Right lower quadrant pain: Secondary | ICD-10-CM | POA: Insufficient documentation

## 2011-08-29 LAB — CBC
Hemoglobin: 13 g/dL (ref 12.0–15.0)
MCHC: 33.2 g/dL (ref 30.0–36.0)
RBC: 4.21 MIL/uL (ref 3.87–5.11)
WBC: 9.3 10*3/uL (ref 4.0–10.5)

## 2011-08-29 LAB — URINALYSIS, ROUTINE W REFLEX MICROSCOPIC
Ketones, ur: NEGATIVE mg/dL
Nitrite: POSITIVE — AB
Specific Gravity, Urine: 1.013 (ref 1.005–1.030)
pH: 7 (ref 5.0–8.0)

## 2011-08-29 LAB — DIFFERENTIAL
Basophils Absolute: 0 10*3/uL (ref 0.0–0.1)
Basophils Relative: 0 % (ref 0–1)
Monocytes Relative: 9 % (ref 3–12)
Neutro Abs: 6.3 10*3/uL (ref 1.7–7.7)
Neutrophils Relative %: 68 % (ref 43–77)

## 2011-08-29 LAB — URINE MICROSCOPIC-ADD ON

## 2011-08-29 LAB — COMPREHENSIVE METABOLIC PANEL
ALT: 52 U/L — ABNORMAL HIGH (ref 0–35)
Albumin: 3.5 g/dL (ref 3.5–5.2)
Alkaline Phosphatase: 66 U/L (ref 39–117)
Glucose, Bld: 82 mg/dL (ref 70–99)
Potassium: 4 mEq/L (ref 3.5–5.1)
Sodium: 137 mEq/L (ref 135–145)
Total Protein: 6.8 g/dL (ref 6.0–8.3)

## 2011-08-29 LAB — POCT I-STAT, CHEM 8
Chloride: 103 mEq/L (ref 96–112)
HCT: 41 % (ref 36.0–46.0)
Potassium: 4 mEq/L (ref 3.5–5.1)
Sodium: 139 mEq/L (ref 135–145)

## 2011-08-29 MED ORDER — SODIUM CHLORIDE 0.9 % IV BOLUS (SEPSIS)
1000.0000 mL | Freq: Once | INTRAVENOUS | Status: AC
  Administered 2011-08-29: 1000 mL via INTRAVENOUS

## 2011-08-29 MED ORDER — SULFAMETHOXAZOLE-TRIMETHOPRIM 400-80 MG PO TABS: 1.0000 | ORAL_TABLET | Freq: Two times a day (BID) | ORAL | Status: AC

## 2011-08-29 MED ORDER — DEXTROSE 5 % IV SOLN
1.0000 g | Freq: Once | INTRAVENOUS | Status: AC
  Administered 2011-08-29: 1 g via INTRAVENOUS
  Filled 2011-08-29: qty 10

## 2011-08-29 MED ORDER — KETOROLAC TROMETHAMINE 30 MG/ML IJ SOLN
30.0000 mg | Freq: Once | INTRAMUSCULAR | Status: AC
  Administered 2011-08-29: 30 mg via INTRAVENOUS
  Filled 2011-08-29: qty 1

## 2011-08-29 MED ORDER — OXYCODONE-ACETAMINOPHEN 5-325 MG PO TABS: 1.0000 | ORAL_TABLET | Freq: Four times a day (QID) | ORAL | Status: AC | PRN

## 2011-08-29 MED ORDER — IOHEXOL 300 MG/ML  SOLN: 80.0000 mL | Freq: Once | INTRAMUSCULAR | Status: DC | PRN

## 2011-08-29 MED ORDER — IOHEXOL 300 MG/ML  SOLN
20.0000 mL | INTRAMUSCULAR | Status: AC
  Administered 2011-08-29 (×2): 20 mL via ORAL

## 2011-08-29 MED ORDER — DEXTROSE 5 % IV SOLN: 1.0000 g | Freq: Once | INTRAVENOUS | Status: DC

## 2011-08-29 NOTE — Discharge Instructions (Signed)
Abdominal Pain (Nonspecific) Your exam might not show the exact reason you have abdominal pain. Since there are many different causes of abdominal pain, another checkup and more tests may be needed. It is very important to follow up for lasting (persistent) or worsening symptoms. A possible cause of abdominal pain in any person who still has his or her appendix is acute appendicitis. Appendicitis is often hard to diagnose. Normal blood tests, urine tests, ultrasound, and CT scans do not completely rule out early appendicitis or other causes of abdominal pain. Sometimes, only the changes that happen over time will allow appendicitis and other causes of abdominal pain to be determined. Other potential problems that may require surgery may also take time to become more apparent. Because of this, it is important that you follow all of the instructions below. HOME CARE INSTRUCTIONS   Rest as much as possible.   Do not eat solid food until your pain is gone.   While adults or children have pain: A diet of water, weak decaffeinated tea, broth or bouillon, gelatin, oral rehydration solutions (ORS), frozen ice pops, or ice chips may be helpful.   When pain is gone in adults or children: Start a light diet (dry toast, crackers, applesauce, or white rice). Increase the diet slowly as long as it does not bother you. Eat no dairy products (including cheese and eggs) and no spicy, fatty, fried, or high-fiber foods.   Use no alcohol, caffeine, or cigarettes.   Take your regular medicines unless your caregiver told you not to.   Take any prescribed medicine as directed.   Only take over-the-counter or prescription medicines for pain, discomfort, or fever as directed by your caregiver. Do not give aspirin to children.  If your caregiver has given you a follow-up appointment, it is very important to keep that appointment. Not keeping the appointment could result in a permanent injury and/or lasting (chronic) pain  and/or disability. If there is any problem keeping the appointment, you must call to reschedule.  SEEK IMMEDIATE MEDICAL CARE IF:   Your pain is not gone in 24 hours.   Your pain becomes worse, changes location, or feels different.   You or your child has an oral temperature above 102 F (38.9 C), not controlled by medicine.   Your baby is older than 3 months with a rectal temperature of 102 F (38.9 C) or higher.   Your baby is 80 months old or younger with a rectal temperature of 100.4 F (38 C) or higher.   You have shaking chills.   You keep throwing up (vomiting) or cannot drink liquids.   There is blood in your vomit or you see blood in your bowel movements.   Your bowel movements become dark or black.   You have frequent bowel movements.   Your bowel movements stop (become blocked) or you cannot pass gas.   You have bloody, frequent, or painful urination.   You have yellow discoloration in the skin or whites of the eyes.   Your stomach becomes bloated or bigger.   You have dizziness or fainting.   You have chest or back pain.  MAKE SURE YOU:   Understand these instructions.   Will watch your condition.   Will get help right away if you are not doing well or get worse.  Document Released: 05/27/2005 Document Revised: 05/16/2011 Document Reviewed: 04/24/2009 [facility] Patient Information 2012 [place], [place].    Asymptomatic Bacteriuria, Female Your urine study shows bacteria in your  urine. You do not have the usual symptoms of burning or frequent urination. This is why it is called asymptomatic. You may need treatment with antibiotics. Treatment is especially important if you are pregnant. Sometimes this condition can progress to a more severe bladder or kidney infection. Symptoms include burning when urinating, back pain, fever, nausea, or vomiting. Take your antibiotics as directed. Finish them even if you start to feel better. Drink enough water and fluids to  keep your urine clear or pale yellow. Go to the bathroom more frequently to keep your bladder empty. Keep the area around the vagina and rectum clean. Wipe yourself from front to back after urinating. Call your caregiver to arrange for follow-up care.  SEEK IMMEDIATE MEDICAL CARE IF:  You develop repeated vomiting.   You develop severe back or abdominal pain.   You have abnormal vaginal discharge or bleeding.   You have blood in the urine.   You develop cramping or abdominal pain.   You have a fever.  If you are pregnant and develop any of the above problems see your caregiver or seek care immediately. Document Released: 05/27/2005 Document Revised: 05/16/2011 Document Reviewed: 04/12/2009 [facility] Patient Information 2012 [place], [place].   Abdominal (belly) pain can be caused by many things. Your caregiver decides the seriousness of your pain by an examination and possibly blood/urine tests and imaging (CT scan, x-rays, ultrasound). Many cases can be observed and treated at home after initial evaluation in the emergency department. Even though you are being discharged home, abdominal pain can be unpredictable. Therefore, you need a repeated exam if your pain does not resolve, returns, or worsens. Most patients with abdominal pain don't have to be admitted to the hospital or have surgery, but serious problems like appendicitis and gallbladder attacks can start out as nonspecific pain. Many abdominal conditions cannot be diagnosed in one visit, so follow-up evaluations are very important. SEEK IMMEDIATE MEDICAL ATTENTION IF: The pain does not go away or becomes severe.  A temperature above 101 develops.  Repeated vomiting occurs (multiple episodes).  The pain becomes localized to portions of the abdomen. The right side could possibly be appendicitis. In an adult, the left lower portion of the abdomen could be colitis or diverticulitis.  Blood is being passed in stools or vomit (bright red or  black tarry stools).  Return also if you develop chest pain, difficulty breathing, dizziness or fainting, or become confused, poorly responsive, or inconsolable (young children).    If you have no primary doctor, here are some resources that may be helpful:  Medicaid-accepting [facility] Providers:   - [facility]- [address]      [phone]      Mon-Fri 9am-7pm, Sat 9am-1pm   - [facility]- [address] [place]      [phone]   - [facility]- [address], [address] [place]      [phone]   [facility]- [address]      [phone]   - Renaye Rakers- [address] [place], [address]      [phone]      Only accepts [place] patients       after they have her name applied to their card   Self Pay (no insurance) in [place]:   - Sickle Cell Patients: Dr Willey Blade, [facility]      [address]      [phone]   - [facility]- [phone]   - Physician Referral Service- [phone]   - [facility]- [address] [place]      [phone]   [facility]rcia D[facility] [place]- [address]   - [facility]- see information above      (Speak to [employer] if you do not have insurance)   - [facility]- [address] [place]      [phone]   - [facility] [place]- [address]      [phone]   - [facility]- [address]      [phone]   - Dr Julio Sicks-  [address], [place]      [phone]   - [facility]- [address]      [phone]   - [facility]- [address]      [phone]      Also [address]      [phone]   - [facility]- [address]      [phone]      1st and 3rd Saturday every month, 10am-1pm Other agencies that provide inexpensive medical care:    [facility]  [phone]     [facility]  [phone]    [facility]  [phone]    [facility]  [phone]    [facility]  [phone]  General Information: Finding a doctor when you do not have health insurance can be tricky. Although you are not limited by an insurance plan, you are of course limited by her finances and how much but he can pay out of pocket.  What are your options if you don't have health insurance?   1) Find a Librarian, academic and Pay Out of Pocket Although you won't have to find out who is covered by your insurance plan, it is a good idea to ask around and get recommendations. You will then need to call the office and see if the doctor you have chosen will accept you as a new patient and what types of options they offer for patients who are self-pay. Some doctors offer discounts or will set up payment plans for their patients who do not have insurance, but you will need to ask so you aren't surprised when you get to your appointment.  2) Contact Your Local Health Department Not all health departments have doctors that can see patients for sick visits, but many do, so it is worth a call to see if yours does. If you don't know where your local health department is, you can check in your phone book. The CDC also has a tool to help you locate your state's health department, and many state websites also have listings of all of their local health departments.  3) Find a Walk-in Clinic If your illness is not likely to be very severe or complicated, you may want to try a walk in clinic. These are popping up all over the country in pharmacies, drugstores, and shopping centers. They're usually staffed by nurse practitioners or physician assistants that have been trained to treat common illnesses and complaints. They're usually fairly quick and inexpensive. However, if you have serious medical issues or chronic medical problems, these are probably not your best option

## 2011-08-29 NOTE — ED Provider Notes (Signed)
History     [name]: [id number]  Arrival date & time 08/29/11  1055   First MD Initiated Contact with Patient 08/29/11 1247      Chief Complaint  Patient presents with  . Abdominal Pain   Patient has had right lower quadrant pain since yesterday. She states she normally has a bowel movement every 3 days, but did try a laxative, which did not improve her pain, but she did have a bowel movement. She's had minimal nausea. No dysuria or discharge. No bleeding. Last menstrual cycle within 3 weeks. Patient was concerned for appendicitis. The pain is worse in with palpation or certain movements. She also has some back pain. No relief with over-the-counter medications  Patient also stopped his pain might be musculoskeletal related to getting up into her new vehicle which has caused her some pain in her right flank area.  (Consider location/radiation/quality/duration/timing/severity/associated sxs/prior treatment) HPI  Past Medical History  Diagnosis Date  . Obesity     Past Surgical History  Procedure Date  . Cesarean section     No family history on file.  History  Substance Use Topics  . Smoking status: Current Everyday Smoker  . Smokeless tobacco: Not on file  . Alcohol Use: No    OB History    Grav Para Term Preterm Abortions TAB SAB Ect Mult Living                  Review of Systems  All other systems reviewed and are negative.    Allergies  Morphine and related  Home Medications   Current Outpatient Rx  Name Route Sig Dispense Refill  . ADULT MULTIVITAMIN W/MINERALS CH Oral Take 1 tablet by mouth daily.      BP 107/57  Pulse 102  Temp(Src) 98.6 F (37 C) (Oral)  Resp 20  SpO2 99%  Physical Exam  Nursing note and vitals reviewed. Constitutional: She is oriented to person, place, and time. She appears well-developed and well-nourished. No distress.       Obese, no acute distress  HENT:  Head: Normocephalic and atraumatic.  Eyes: Conjunctivae and EOM  are normal. Pupils are equal, round, and reactive to light.  Neck: Neck supple.  Cardiovascular: Normal rate and regular rhythm.  Exam reveals no gallop and no friction rub.   No murmur heard. Pulmonary/Chest: Breath sounds normal. She has no wheezes. She has no rales. She exhibits no tenderness.  Abdominal: Soft. Bowel sounds are normal. She exhibits no distension. There is tenderness. There is no rebound and no guarding.       Diffuse tenderness, right lower quadrant, greater than right upper quadrant. No rebound or guarding.  Musculoskeletal: Normal range of motion.  Neurological: She is alert and oriented to person, place, and time. No cranial nerve deficit. Coordination normal.  Skin: Skin is warm and dry. No rash noted.  Psychiatric: She has a normal mood and affect.    ED Course  Procedures (including critical care time)   Labs Reviewed  POCT PREGNANCY, URINE  URINALYSIS, ROUTINE W REFLEX MICROSCOPIC  CBC  DIFFERENTIAL  COMPREHENSIVE METABOLIC PANEL  LIPASE, BLOOD   No results found.   No diagnosis found.    MDM  Patient is seen and examined, initial history and physical is completed. Evaluation initiated   IV fluids, Toradol, pain meds, antiemetics CT scan to rule out appendicitis.     ================  Results for orders placed during the hospital encounter of 08/29/11  URINALYSIS, ROUTINE W REFLEX  MICROSCOPIC      Component Value Range   Color, Urine YELLOW  YELLOW    APPearance CLOUDY (*) CLEAR    Specific Gravity, Urine 1.013  1.005 - 1.030    pH 7.0  5.0 - 8.0    Glucose, UA NEGATIVE  NEGATIVE (mg/dL)   Hgb urine dipstick MODERATE (*) NEGATIVE    Bilirubin Urine NEGATIVE  NEGATIVE    Ketones, ur NEGATIVE  NEGATIVE (mg/dL)   Protein, ur 30 (*) NEGATIVE (mg/dL)   Urobilinogen, UA 0.2  0.0 - 1.0 (mg/dL)   Nitrite POSITIVE (*) NEGATIVE    Leukocytes, UA LARGE (*) NEGATIVE   CBC      Component Value Range   WBC 9.3  4.0 - 10.5 (K/uL)   RBC 4.21   3.87 - 5.11 (MIL/uL)   Hemoglobin 13.0  12.0 - 15.0 (g/dL)   HCT 16.1  09.6 - 04.5 (%)   MCV 93.1  78.0 - 100.0 (fL)   MCH 30.9  26.0 - 34.0 (pg)   MCHC 33.2  30.0 - 36.0 (g/dL)   RDW 40.9  81.1 - 91.4 (%)   Platelets 257  150 - 400 (K/uL)  DIFFERENTIAL      Component Value Range   Neutrophils Relative 68  43 - 77 (%)   Neutro Abs 6.3  1.7 - 7.7 (K/uL)   Lymphocytes Relative 22  12 - 46 (%)   Lymphs Abs 2.0  0.7 - 4.0 (K/uL)   Monocytes Relative 9  3 - 12 (%)   Monocytes Absolute 0.8  0.1 - 1.0 (K/uL)   Eosinophils Relative 2  0 - 5 (%)   Eosinophils Absolute 0.2  0.0 - 0.7 (K/uL)   Basophils Relative 0  0 - 1 (%)   Basophils Absolute 0.0  0.0 - 0.1 (K/uL)  POCT PREGNANCY, URINE      Component Value Range   Preg Test, Ur NEGATIVE  NEGATIVE   COMPREHENSIVE METABOLIC PANEL      Component Value Range   Sodium 137  135 - 145 (mEq/L)   Potassium 4.0  3.5 - 5.1 (mEq/L)   Chloride 102  96 - 112 (mEq/L)   CO2 28  19 - 32 (mEq/L)   Glucose, Bld 82  70 - 99 (mg/dL)   BUN 9  6 - 23 (mg/dL)   Creatinine, Ser 7.82  0.50 - 1.10 (mg/dL)   Calcium 9.4  8.4 - 95.6 (mg/dL)   Total Protein 6.8  6.0 - 8.3 (g/dL)   Albumin 3.5  3.5 - 5.2 (g/dL)   AST 37  0 - 37 (U/L)   ALT 52 (*) 0 - 35 (U/L)   Alkaline Phosphatase 66  39 - 117 (U/L)   Total Bilirubin 1.1  0.3 - 1.2 (mg/dL)   GFR calc non Af Amer >90  >90 (mL/min)   GFR calc Af Amer >90  >90 (mL/min)  LIPASE, BLOOD      Component Value Range   Lipase 14  11 - 59 (U/L)  URINE MICROSCOPIC-ADD ON      Component Value Range   Squamous Epithelial / LPF FEW (*) RARE    WBC, UA TOO NUMEROUS TO COUNT  <3 (WBC/hpf)   RBC / HPF 7-10  <3 (RBC/hpf)   Bacteria, UA MANY (*) RARE   POCT I-STAT, CHEM 8      Component Value Range   Sodium 139  135 - 145 (mEq/L)   Potassium 4.0  3.5 - 5.1 (mEq/L)  Chloride 103  96 - 112 (mEq/L)   BUN 8  6 - 23 (mg/dL)   Creatinine, Ser 1.61  0.50 - 1.10 (mg/dL)   Glucose, Bld 85  70 - 99 (mg/dL)   Calcium, Ion  0.96  1.12 - 1.32 (mmol/L)   TCO2 26  0 - 100 (mmol/L)   Hemoglobin 13.9  12.0 - 15.0 (g/dL)   HCT 04.5  40.9 - 81.1 (%)   Ct Abdomen Pelvis Wo Contrast  08/29/2011  *RADIOLOGY REPORT*  Clinical Data: Right lower quadrant pain since yesterday.  History of tubal ligation.  History of adverse reaction to iodinated contrast.  CT ABDOMEN AND PELVIS WITHOUT CONTRAST  Technique:  Multidetector CT imaging of the abdomen and pelvis was performed following the standard protocol without intravenous contrast.  Comparison: Prior examinations 01/16/2006.  Findings: The lung bases are clear.  There is no pleural effusion. As evaluated in the noncontrast state, the liver, spleen, gallbladder, pancreas, adrenal glands and kidneys appear unremarkable.  The previously demonstrated infraumbilical ventral hernia has been repaired.  No recurrent hernia is identified.  Irregular soft tissue within the subcutaneous fat at the site of the repaired hernia measures 4.0 x 3.4 cm transverse and could reflect postsurgical scarring.  A desmoid tumor could have this appearance. No intra-abdominal masses or enlarged lymph nodes are identified. The bowel gas pattern is normal.  The appendix appears normal.  The uterus and ovaries appear unremarkable.  No bladder abnormalities are seen.  Small right pelvic calcifications are unchanged.  No osseous abnormalities are identified.  IMPRESSION:  1.  No acute abdominal pelvic findings demonstrated. 2.  Prominent soft tissue mass within the anterior abdominal wall status post ventral hernia repair. This appearance is nonspecific and could reflect postsurgical scarring.  However, a desmoid tumor cannot be excluded.  No recurrent hernia is seen. 3.  No evidence of appendicitis.  Original Report Authenticated By: Gerrianne Scale, M.D.    -------------------------------  CT scan showed no acute findings. Prominent soft tissue mass, possibly related to postsurgical scarring. No evidence of  appendicitis. Patient's white count was normal. Lipase was normal. Metabolic panel was normal, but did have evidence of urinary tract infection. She was treated with Rocephin for this.   Plan: Patient will be discharged home on a course of pain medication and oral antibiotics for urinary infection. Recommended following up with her primary Dr. later in the week or returning to the ED for any concerns or changing symptoms   Will be discharged home in stable improved condition    Stevin Bielinski A. Patrica Duel, MD 08/29/11 1544

## 2011-08-29 NOTE — ED Notes (Signed)
Rt sided abd pain started yesterday  Flank  Took a lax and it worked

## 2011-08-29 NOTE — ED Notes (Signed)
Discharge instructions reviewed with pt; verbalizes understanding.  No questions asked; no further c/o's voiced.  Ambulatory to lobby. NAD noted. 

## 2011-11-12 ENCOUNTER — Encounter (HOSPITAL_COMMUNITY): Payer: Self-pay | Admitting: *Deleted

## 2011-11-12 ENCOUNTER — Emergency Department (HOSPITAL_COMMUNITY)
Admission: EM | Admit: 2011-11-12 | Discharge: 2011-11-12 | Disposition: A | Discharge status: Home / Self Care | Payer: 59 | Attending: Emergency Medicine | Admitting: Emergency Medicine

## 2011-11-12 DIAGNOSIS — Z0389 Encounter for observation for other suspected diseases and conditions ruled out: Secondary | ICD-10-CM | POA: Insufficient documentation

## 2011-11-12 NOTE — ED Notes (Signed)
Spoke with pt while I was discharging her son. She said that she does not want to have to keep her children in the ED for a long time, and that she will go to urgent care in the morning for evaluation. I Ambulance person of this
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
## 2012-02-13 ENCOUNTER — Emergency Department (INDEPENDENT_AMBULATORY_CARE_PROVIDER_SITE_OTHER)
Admission: EM | Admit: 2012-02-13 | Discharge: 2012-02-13 | Disposition: A | Discharge status: Home / Self Care | Payer: 59 | Source: Home / Self Care | Attending: Emergency Medicine | Admitting: Emergency Medicine

## 2012-02-13 ENCOUNTER — Emergency Department (INDEPENDENT_AMBULATORY_CARE_PROVIDER_SITE_OTHER): Payer: 59

## 2012-02-13 ENCOUNTER — Encounter (HOSPITAL_COMMUNITY): Payer: Self-pay | Admitting: *Deleted

## 2012-02-13 DIAGNOSIS — J329 Chronic sinusitis, unspecified: Secondary | ICD-10-CM

## 2012-02-13 MED ORDER — ALBUTEROL SULFATE (5 MG/ML) 0.5% IN NEBU
INHALATION_SOLUTION | RESPIRATORY_TRACT | Status: AC
  Filled 2012-02-13: qty 0.5

## 2012-02-13 MED ORDER — PREDNISONE 20 MG PO TABS: 40.0000 mg | ORAL_TABLET | Freq: Every day | ORAL | Status: AC

## 2012-02-13 MED ORDER — AZITHROMYCIN 250 MG PO TABS: 250.0000 mg | ORAL_TABLET | Freq: Every day | ORAL | Status: AC

## 2012-02-13 MED ORDER — ALBUTEROL SULFATE (5 MG/ML) 0.5% IN NEBU
2.5000 mg | INHALATION_SOLUTION | Freq: Once | RESPIRATORY_TRACT | Status: AC
  Administered 2012-02-13: 2.5 mg via RESPIRATORY_TRACT

## 2012-02-13 MED ORDER — ALBUTEROL SULFATE HFA 108 (90 BASE) MCG/ACT IN AERS: 1.0000 | INHALATION_SPRAY | Freq: Four times a day (QID) | RESPIRATORY_TRACT | Status: DC | PRN

## 2012-02-13 NOTE — ED Provider Notes (Signed)
History     [name]: [id number]  Arrival date & time 02/13/12  1304   First MD Initiated Contact with Patient 02/13/12 1310      Chief Complaint  Patient presents with  . Cough    (Consider location/radiation/quality/duration/timing/severity/associated sxs/prior treatment) HPI Comments: Patient presents urgent care this afternoon complaining of ongoing cough and congestion for greater than a week. Is getting worse and she feels she's coughing more frequently and now bringing up colored sputum she describes as yellowish to green. Initial days he experienced body aches and sinus congestion with dripping behind her throat. Describes some intermittent wheezing and shortness of breath.  Patient has been trying with multiple over-the-counter products such as Tylenol Sinus and NyQuil and Motrin describes it did not help her at all with her cough. Patient describes it she is a smoker but has come down to 2-3 cigarettes per day since been sick.  Patient is a 36 y.o. female presenting with cough. The history is provided by the patient.  Cough This is a new problem. The current episode started more than 1 week ago. The problem occurs constantly. The problem has been gradually worsening. The cough is productive of brown sputum. Associated symptoms include chills, sweats, rhinorrhea, sore throat, myalgias, shortness of breath and wheezing. The treatment provided no relief.    Past Medical History  Diagnosis Date  . Obesity     Past Surgical History  Procedure Date  . Cesarean section     Family History  Problem Relation Age of Onset  . Cancer Mother   . Heart failure Mother   . Cancer Father   . Heart failure Father     History  Substance Use Topics  . Smoking status: Current Everyday Smoker -- 0.5 packs/day  . Smokeless tobacco: Not on file  . Alcohol Use: No    OB History    Grav Para Term Preterm Abortions TAB SAB Ect Mult Living   4 4 4       4       Review of Systems    Constitutional: Positive for chills and activity change. Negative for fever, diaphoresis, fatigue and unexpected weight change.  HENT: Positive for sore throat and rhinorrhea.   Respiratory: Positive for cough, shortness of breath and wheezing.   Musculoskeletal: Positive for myalgias.    Allergies  Contrast media and Morphine and related  Home Medications   Current Outpatient Rx  Name Route Sig Dispense Refill  . ADULT MULTIVITAMIN W/MINERALS CH Oral Take 1 tablet by mouth daily.      BP 105/55  Pulse 88  Temp 98.5 F (36.9 C) (Oral)  Resp 18  SpO2 99%  LMP 02/08/2012  Physical Exam  Nursing note and vitals reviewed. Constitutional: Vital signs are normal. She appears well-developed.  Non-toxic appearance. She does not have a sickly appearance. She does not appear ill. No distress.  HENT:  Head: Normocephalic.  Right Ear: Tympanic membrane normal.  Left Ear: Tympanic membrane normal.  Nose: Nose normal.  Mouth/Throat: Uvula is midline and mucous membranes are normal.  Eyes: Conjunctivae are normal.  Neck: Neck supple.  Cardiovascular: Normal rate.   Pulmonary/Chest: Effort normal. No respiratory distress. She has decreased breath sounds. She has wheezes. She has no rales. She exhibits no tenderness.  Musculoskeletal: Normal range of motion.  Skin: No erythema.    ED Course  Procedures (including critical care time)  Labs Reviewed - No data to display Dg Chest 2 View  02/13/2012  *RADIOLOGY  REPORT*  Clinical Data: Cough  CHEST - 2 VIEW  Comparison: November 22, 2008  Findings: Lungs clear.  Heart size and pulmonary vascularity are normal.  No adenopathy.  No bone lesions.  IMPRESSION: Lungs clear.   Original Report Authenticated By: Arvin Collard. WOODRUFF III, M.D.      No diagnosis found.    MDM          Jimmie Molly, MD 02/13/12 1410

## 2012-02-13 NOTE — ED Notes (Signed)
Pt reports coughing and "losing voice" for the past week - no relief from otc meds

## 2012-06-11 LAB — HM PAP SMEAR: HM PAP: NEGATIVE

## 2012-10-22 ENCOUNTER — Encounter (HOSPITAL_COMMUNITY): Payer: Self-pay | Admitting: Emergency Medicine

## 2012-10-22 ENCOUNTER — Emergency Department (HOSPITAL_COMMUNITY)
Admission: EM | Admit: 2012-10-22 | Discharge: 2012-10-22 | Disposition: A | Discharge status: Home / Self Care | Payer: 59 | Source: Home / Self Care | Attending: Emergency Medicine | Admitting: Emergency Medicine

## 2012-10-22 DIAGNOSIS — S335XXA Sprain of ligaments of lumbar spine, initial encounter: Secondary | ICD-10-CM

## 2012-10-22 DIAGNOSIS — S39012A Strain of muscle, fascia and tendon of lower back, initial encounter: Secondary | ICD-10-CM

## 2012-10-22 MED ORDER — KETOROLAC TROMETHAMINE 60 MG/2ML IM SOLN
INTRAMUSCULAR | Status: AC
  Filled 2012-10-22: qty 2

## 2012-10-22 MED ORDER — HYDROCODONE-ACETAMINOPHEN 10-325 MG PO TABS: 1.0000 | ORAL_TABLET | Freq: Four times a day (QID) | ORAL | Status: DC | PRN

## 2012-10-22 MED ORDER — KETOROLAC TROMETHAMINE 60 MG/2ML IM SOLN
60.0000 mg | Freq: Once | INTRAMUSCULAR | Status: AC
  Administered 2012-10-22: 60 mg via INTRAMUSCULAR

## 2012-10-22 MED ORDER — DICLOFENAC SODIUM 75 MG PO TBEC: 75.0000 mg | DELAYED_RELEASE_TABLET | Freq: Two times a day (BID) | ORAL | Status: DC

## 2012-10-22 MED ORDER — CYCLOBENZAPRINE HCL 5 MG PO TABS: 5.0000 mg | ORAL_TABLET | Freq: Three times a day (TID) | ORAL | Status: DC | PRN

## 2012-10-22 NOTE — ED Provider Notes (Signed)
History     [name]: [id number]  Arrival date & time 10/22/12  1432   First MD Initiated Contact with Patient 10/22/12 1636      Chief Complaint  Patient presents with  . Back Pain    (Consider location/radiation/quality/duration/timing/severity/associated sxs/prior treatment) HPI Comments: This 37 year old severely obese female was moving furniture on Monday, 3 days ago. The following morning she awoke with pain across her lower back and in her thighs. The pain has increased in intensity over the past couple days where it is difficult to move without pain and nearly all movement produces moderate to severe pain. She says the pain sometimes paralyze her and will radiate into the thigh muscles. Denies paresthesias or focal weakness. No trauma or fall.   Past Medical History  Diagnosis Date  . Obesity     Past Surgical History  Procedure Laterality Date  . Cesarean section      Family History  Problem Relation Age of Onset  . Cancer Mother   . Heart failure Mother   . Cancer Father   . Heart failure Father     History  Substance Use Topics  . Smoking status: Current Every Day Smoker -- 0.50 packs/day  . Smokeless tobacco: Not on file  . Alcohol Use: No    OB History   Grav Para Term Preterm Abortions TAB SAB Ect Mult Living   4 4 4       4       Review of Systems  Constitutional: Negative for fever, chills and activity change.  HENT: Negative.   Respiratory: Negative.   Cardiovascular: Negative.   Musculoskeletal:       As per HPI  Skin: Negative for color change, pallor and rash.  Neurological: Negative.     Allergies  Contrast media and Morphine and related  Home Medications   Current Outpatient Rx  Name  Route  Sig  Dispense  Refill  . HYDROCODONE-ACETAMINOPHEN PO   Oral   Take by mouth.         Marland Kitchen ibuprofen (ADVIL,MOTRIN) 200 MG tablet   Oral   Take 200 mg by mouth every 6 (six) hours as needed for pain.         Marland Kitchen albuterol (PROVENTIL  HFA;VENTOLIN HFA) 108 (90 BASE) MCG/ACT inhaler   Inhalation   Inhale 1-2 puffs into the lungs every 6 (six) hours as needed for wheezing.   1 Inhaler   0   . cyclobenzaprine (FLEXERIL) 5 MG tablet   Oral   Take 1 tablet (5 mg total) by mouth 3 (three) times daily as needed for muscle spasms.   21 tablet   0   . diclofenac (VOLTAREN) 75 MG EC tablet   Oral   Take 1 tablet (75 mg total) by mouth 2 (two) times daily. Take with food   20 tablet   0   . HYDROcodone-acetaminophen (NORCO) 10-325 MG per tablet   Oral   Take 1 tablet by mouth every 6 (six) hours as needed for pain.   15 tablet   0   . Multiple Vitamin (MULITIVITAMIN WITH MINERALS) TABS   Oral   Take 1 tablet by mouth daily.           BP 95/60  Pulse 79  Temp(Src) 97.5 F (36.4 C) (Oral)  Resp 20  SpO2 100%  Physical Exam  Nursing note and vitals reviewed. Constitutional: She is oriented to person, place, and time. She appears well-developed and well-nourished. No distress.  HENT:  Head: Normocephalic and atraumatic.  Eyes: EOM are normal. Pupils are equal, round, and reactive to light.  Neck: Normal range of motion. Neck supple.  Pulmonary/Chest: Effort normal.  Musculoskeletal: She exhibits tenderness. She exhibits no edema.  Marked tenderness across the paralumbar musculature. Muscle spasms within the musculature can be palpated deeply. No direct spinal tenderness. Tenderness to the anterior thighs/quadriceps. Movements such as arising from a sitting position from lying is quite arduous and painful for her. Distal neurovascular and motor sensory is intact. No edema. Moves all extremities.  Neurological: She is alert and oriented to person, place, and time. No cranial nerve deficit.  Skin: Skin is warm and dry.  Psychiatric: She has a normal mood and affect.    ED Course  Procedures (including critical care time)  Labs Reviewed - No data to display No results found.   1. Lumbar strain, initial  encounter       MDM  Heat stretches is demonstrated Do not stay in bed Norco 10 one every 6 hours when necessary pain Diclofenac 75 mg twice a day with food when necessary pain Flexeril 5 mg 3 times a day when necessary pain No exercise or movement that exacerbates pain The above medicines may cause drowsiness.       Hayden Rasmussen, NP 10/22/12 1723

## 2012-10-22 NOTE — ED Provider Notes (Signed)
Medical screening examination/treatment/procedure(s) were performed by non-physician practitioner and as supervising physician I was immediately available for consultation/collaboration.  Leslee Home, M.D.   Reuben Likes, MD 10/22/12 [phone]

## 2012-10-22 NOTE — ED Notes (Signed)
Reports back pain, pain into legs, particularly left leg.  Onset of pain was 5/13.  Has used heat in shower and old hydrocodone with minimal relief.

## 2013-05-11 ENCOUNTER — Emergency Department (HOSPITAL_COMMUNITY)
Admission: EM | Admit: 2013-05-11 | Discharge: 2013-05-11 | Discharge status: Against Medical Advice | Payer: 59 | Attending: Emergency Medicine | Admitting: Emergency Medicine

## 2013-05-11 ENCOUNTER — Encounter (HOSPITAL_COMMUNITY): Payer: Self-pay | Admitting: Emergency Medicine

## 2013-05-11 DIAGNOSIS — M545 Low back pain, unspecified: Secondary | ICD-10-CM | POA: Insufficient documentation

## 2013-05-11 DIAGNOSIS — M79609 Pain in unspecified limb: Secondary | ICD-10-CM | POA: Insufficient documentation

## 2013-05-11 DIAGNOSIS — E669 Obesity, unspecified: Secondary | ICD-10-CM | POA: Insufficient documentation

## 2013-05-11 DIAGNOSIS — Z888 Allergy status to other drugs, medicaments and biological substances status: Secondary | ICD-10-CM | POA: Insufficient documentation

## 2013-05-11 DIAGNOSIS — F17200 Nicotine dependence, unspecified, uncomplicated: Secondary | ICD-10-CM | POA: Insufficient documentation

## 2013-05-11 DIAGNOSIS — M7989 Other specified soft tissue disorders: Secondary | ICD-10-CM | POA: Insufficient documentation

## 2013-05-11 DIAGNOSIS — Z885 Allergy status to narcotic agent status: Secondary | ICD-10-CM | POA: Insufficient documentation

## 2013-05-11 DIAGNOSIS — Z79899 Other long term (current) drug therapy: Secondary | ICD-10-CM | POA: Insufficient documentation

## 2013-05-11 LAB — URINALYSIS, ROUTINE W REFLEX MICROSCOPIC
Hgb urine dipstick: NEGATIVE
Ketones, ur: NEGATIVE mg/dL
Protein, ur: NEGATIVE mg/dL
Urobilinogen, UA: 1 mg/dL (ref 0.0–1.0)

## 2013-05-11 LAB — COMPREHENSIVE METABOLIC PANEL
AST: 39 U/L — ABNORMAL HIGH (ref 0–37)
Alkaline Phosphatase: 61 U/L (ref 39–117)
BUN: 12 mg/dL (ref 6–23)
CO2: 25 mEq/L (ref 19–32)
Chloride: 101 mEq/L (ref 96–112)
Creatinine, Ser: 0.83 mg/dL (ref 0.50–1.10)
GFR calc non Af Amer: 88 mL/min — ABNORMAL LOW (ref >90)
Potassium: 4 mEq/L (ref 3.5–5.1)
Total Bilirubin: 1.2 mg/dL (ref 0.3–1.2)

## 2013-05-11 LAB — CBC WITH DIFFERENTIAL/PLATELET
Basophils Absolute: 0 10*3/uL (ref 0.0–0.1)
HCT: 40.7 % (ref 36.0–46.0)
Hemoglobin: 13.5 g/dL (ref 12.0–15.0)
Lymphocytes Relative: 38 % (ref 12–46)
Monocytes Absolute: 0.5 10*3/uL (ref 0.1–1.0)
Monocytes Relative: 8 % (ref 3–12)
Neutro Abs: 3.5 10*3/uL (ref 1.7–7.7)
WBC: 6.7 10*3/uL (ref 4.0–10.5)

## 2013-05-11 MED ORDER — KETOROLAC TROMETHAMINE 60 MG/2ML IM SOLN
60.0000 mg | Freq: Once | INTRAMUSCULAR | Status: DC
  Filled 2013-05-11: qty 2

## 2013-05-11 NOTE — ED Provider Notes (Signed)
[name]: [id number]     Arrival date & time 05/11/13  1036 History   First MD Initiated Contact with Patient 05/11/13 1114     Chief Complaint  Patient presents with  . Back Pain   (Consider location/radiation/quality/duration/timing/severity/associated sxs/prior Treatment) HPI Comments: Patient is a 37 year old female with history of obesity. She presents today with complaints of pain in her lower back that has been going on for the past several days. She states the pain radiates into her legs and she can feel in her feet. She states her feet also feel swollen and felt that her shoes fit tighter this morning and normally do. She denies any fevers or chills. She does report some concentrated urine but denies dysuria. She denies any bowel or bladder incontinence or weakness in her legs.  She was seen in urgent care yesterday and had a pelvic exam and urinalysis performed. She was told she had blood in her urine but everything else looked okay. She was also told this may be a kidney stone. She presents today concerned that this is what she has.  Patient is a 37 y.o. female presenting with back pain. The history is provided by the patient.  Back Pain Location:  Lumbar spine Quality:  Shooting Pain severity:  Moderate Pain is:  Same all the time Onset quality:  Gradual Duration:  4 days Timing:  Constant Progression:  Worsening Chronicity:  New Relieved by:  Nothing Worsened by:  Bending, palpation and sitting Ineffective treatments:  Ibuprofen and narcotics Associated symptoms: no abdominal pain, no fever and no weakness     Past Medical History  Diagnosis Date  . Obesity    Past Surgical History  Procedure Laterality Date  . Cesarean section     Family History  Problem Relation Age of Onset  . Cancer Mother   . Heart failure Mother   . Cancer Father   . Heart failure Father    History  Substance Use Topics  . Smoking status: Current Every Day Smoker -- 0.50 packs/day  .  Smokeless tobacco: Not on file  . Alcohol Use: No   OB History   Grav Para Term Preterm Abortions TAB SAB Ect Mult Living   4 4 4       4      Review of Systems  Constitutional: Negative for fever.  Gastrointestinal: Negative for abdominal pain.  Musculoskeletal: Positive for back pain.  Neurological: Negative for weakness.  All other systems reviewed and are negative.    Allergies  Contrast media and Morphine and related  Home Medications   Current Outpatient Rx  Name  Route  Sig  Dispense  Refill  . EXPIRED: albuterol (PROVENTIL HFA;VENTOLIN HFA) 108 (90 BASE) MCG/ACT inhaler   Inhalation   Inhale 1-2 puffs into the lungs every 6 (six) hours as needed for wheezing.   1 Inhaler   0   . cyclobenzaprine (FLEXERIL) 5 MG tablet   Oral   Take 1 tablet (5 mg total) by mouth 3 (three) times daily as needed for muscle spasms.   21 tablet   0   . diclofenac (VOLTAREN) 75 MG EC tablet   Oral   Take 1 tablet (75 mg total) by mouth 2 (two) times daily. Take with food   20 tablet   0   . HYDROcodone-acetaminophen (NORCO) 10-325 MG per tablet   Oral   Take 1 tablet by mouth every 6 (six) hours as needed for pain.   15 tablet  0   . HYDROCODONE-ACETAMINOPHEN PO   Oral   Take by mouth.         Marland Kitchen ibuprofen (ADVIL,MOTRIN) 200 MG tablet   Oral   Take 200 mg by mouth every 6 (six) hours as needed for pain.         . Multiple Vitamin (MULITIVITAMIN WITH MINERALS) TABS   Oral   Take 1 tablet by mouth daily.          BP 134/67  Pulse 82  Temp(Src) 98.2 F (36.8 C) (Oral)  Resp 24  Ht 5\' 7"  (1.702 m)  Wt 306 lb (138.801 kg)  BMI 47.92 kg/m2  SpO2 100% Physical Exam  Nursing note and vitals reviewed. Constitutional: She is oriented to person, place, and time. She appears well-developed and well-nourished. No distress.  HENT:  Head: Normocephalic and atraumatic.  Neck: Normal range of motion. Neck supple.  Cardiovascular: Normal rate and regular rhythm.  Exam  reveals no gallop and no friction rub.   No murmur heard. Pulmonary/Chest: Effort normal and breath sounds normal. No respiratory distress. She has no wheezes.  Abdominal: Soft. Bowel sounds are normal. She exhibits no distension. There is no tenderness.  Abdomen is obese but nontender and benign.  Musculoskeletal: Normal range of motion. She exhibits no edema.  There is tenderness to palpation in the soft tissues along the lumbar region. There is no bony tenderness or step-off.  Neurological: She is alert and oriented to person, place, and time. She exhibits normal muscle tone. Coordination normal.  Patellar and Achilles reflexes are 1+ and equal bilaterally. Strength is 5 out of 5 in the bilateral lower extremities and she is able to ambulate without difficulty.  Skin: Skin is warm and dry. She is not diaphoretic.    ED Course  Procedures (including critical care time) Labs Review Labs Reviewed  URINALYSIS, ROUTINE W REFLEX MICROSCOPIC - Abnormal; Notable for the following:    APPearance HAZY (*)    All other components within normal limits  CBC WITH DIFFERENTIAL  COMPREHENSIVE METABOLIC PANEL  PREGNANCY, URINE   Imaging Review No results found.    MDM  No diagnosis found. Patient is a 37 year old obese female presents with complaints of low back pain. She was told in urgent care she may have a kidney stone. As she is not improving with the treatment they prescribed yesterday she presents here to be evaluated. Her symptoms seem musculoskeletal in nature and neurologic exam is nonfocal. I had intended to perform a CT scan of the abdomen and pelvis to rule out renal calculus. While patient was waiting for radiology tech to take her there, she informed the nurse that she needed to be somewhere could not stay for this study. She then signed out against medical device and left the department.    Geoffery Lyons, MD 05/11/13 [phone]

## 2013-05-11 NOTE — ED Notes (Signed)
Pt in c/o lower back pain that is radiating down her legs when standing, states pain started over the weekend and she went to an urgent care where they tested her urine and did a pelvic exam, only abnormal findings were blood in her urine, pt states in the last few days pain has increased and she has noted swelling her feet, this morning she was unable to get her shoes on, denies shortness of breath

## 2013-06-15 ENCOUNTER — Encounter: Payer: Self-pay | Admitting: Nurse Practitioner

## 2013-06-16 ENCOUNTER — Ambulatory Visit: Payer: Self-pay | Admitting: Nurse Practitioner

## 2013-06-16 ENCOUNTER — Encounter: Payer: Self-pay | Admitting: Nurse Practitioner

## 2014-04-11 ENCOUNTER — Emergency Department (HOSPITAL_COMMUNITY)
Admission: EM | Admit: 2014-04-11 | Discharge: 2014-04-12 | Disposition: A | Discharge status: Home / Self Care | Payer: Medicaid Other | Attending: Emergency Medicine | Admitting: Emergency Medicine

## 2014-04-11 ENCOUNTER — Encounter: Payer: Self-pay | Admitting: Nurse Practitioner

## 2014-04-11 DIAGNOSIS — M62838 Other muscle spasm: Secondary | ICD-10-CM

## 2014-04-11 DIAGNOSIS — M62830 Muscle spasm of back: Secondary | ICD-10-CM | POA: Insufficient documentation

## 2014-04-11 DIAGNOSIS — M545 Low back pain, unspecified: Secondary | ICD-10-CM

## 2014-04-11 DIAGNOSIS — Z8679 Personal history of other diseases of the circulatory system: Secondary | ICD-10-CM | POA: Insufficient documentation

## 2014-04-11 DIAGNOSIS — M549 Dorsalgia, unspecified: Secondary | ICD-10-CM | POA: Diagnosis present

## 2014-04-11 DIAGNOSIS — Z720 Tobacco use: Secondary | ICD-10-CM | POA: Diagnosis not present

## 2014-04-11 DIAGNOSIS — E669 Obesity, unspecified: Secondary | ICD-10-CM | POA: Insufficient documentation

## 2014-04-11 DIAGNOSIS — Z3202 Encounter for pregnancy test, result negative: Secondary | ICD-10-CM | POA: Diagnosis not present

## 2014-04-11 DIAGNOSIS — N390 Urinary tract infection, site not specified: Secondary | ICD-10-CM | POA: Diagnosis not present

## 2014-04-11 LAB — URINE MICROSCOPIC-ADD ON

## 2014-04-11 LAB — COMPREHENSIVE METABOLIC PANEL
ALBUMIN: 3.4 g/dL — AB (ref 3.5–5.2)
ALT: 16 U/L (ref 0–35)
ANION GAP: 12 (ref 5–15)
AST: 14 U/L (ref 0–37)
Alkaline Phosphatase: 67 U/L (ref 39–117)
BILIRUBIN TOTAL: 0.7 mg/dL (ref 0.3–1.2)
BUN: 12 mg/dL (ref 6–23)
CHLORIDE: 102 meq/L (ref 96–112)
CO2: 25 mEq/L (ref 19–32)
CREATININE: 0.96 mg/dL (ref 0.50–1.10)
Calcium: 9.3 mg/dL (ref 8.4–10.5)
GFR calc non Af Amer: 74 mL/min — ABNORMAL LOW (ref >90)
GFR, EST AFRICAN AMERICAN: 85 mL/min — AB (ref >90)
GLUCOSE: 96 mg/dL (ref 70–99)
POTASSIUM: 4.1 meq/L (ref 3.7–5.3)
Sodium: 139 mEq/L (ref 137–147)
Total Protein: 7.2 g/dL (ref 6.0–8.3)

## 2014-04-11 LAB — CBC WITH DIFFERENTIAL/PLATELET
BASOS ABS: 0 10*3/uL (ref 0.0–0.1)
BASOS PCT: 0 % (ref 0–1)
EOS ABS: 0.2 10*3/uL (ref 0.0–0.7)
Eosinophils Relative: 3 % (ref 0–5)
HCT: 40.7 % (ref 36.0–46.0)
HEMOGLOBIN: 13.4 g/dL (ref 12.0–15.0)
Lymphocytes Relative: 37 % (ref 12–46)
Lymphs Abs: 2.8 10*3/uL (ref 0.7–4.0)
MCH: 29.8 pg (ref 26.0–34.0)
MCHC: 32.9 g/dL (ref 30.0–36.0)
MCV: 90.6 fL (ref 78.0–100.0)
MONOS PCT: 9 % (ref 3–12)
Monocytes Absolute: 0.6 10*3/uL (ref 0.1–1.0)
NEUTROS PCT: 51 % (ref 43–77)
Neutro Abs: 3.9 10*3/uL (ref 1.7–7.7)
Platelets: 289 10*3/uL (ref 150–400)
RBC: 4.49 MIL/uL (ref 3.87–5.11)
RDW: 13.3 % (ref 11.5–15.5)
WBC: 7.6 10*3/uL (ref 4.0–10.5)

## 2014-04-11 LAB — URINALYSIS, ROUTINE W REFLEX MICROSCOPIC
Bilirubin Urine: NEGATIVE
GLUCOSE, UA: NEGATIVE mg/dL
KETONES UR: 15 mg/dL — AB
NITRITE: NEGATIVE
PH: 6 (ref 5.0–8.0)
PROTEIN: NEGATIVE mg/dL
Specific Gravity, Urine: 1.028 (ref 1.005–1.030)
Urobilinogen, UA: 1 mg/dL (ref 0.0–1.0)

## 2014-04-11 LAB — LIPASE, BLOOD: Lipase: 23 U/L (ref 11–59)

## 2014-04-11 LAB — PREGNANCY, URINE: PREG TEST UR: NEGATIVE

## 2014-04-11 MED ORDER — KETOROLAC TROMETHAMINE 30 MG/ML IJ SOLN
30.0000 mg | Freq: Once | INTRAMUSCULAR | Status: AC
  Administered 2014-04-11: 30 mg via INTRAVENOUS
  Filled 2014-04-11: qty 1

## 2014-04-11 MED ORDER — HYDROMORPHONE HCL 1 MG/ML IJ SOLN
1.0000 mg | Freq: Once | INTRAMUSCULAR | Status: AC
  Administered 2014-04-11: 1 mg via INTRAVENOUS
  Filled 2014-04-11: qty 1

## 2014-04-11 MED ORDER — SODIUM CHLORIDE 0.9 % IV BOLUS (SEPSIS)
1000.0000 mL | Freq: Once | INTRAVENOUS | Status: AC
  Administered 2014-04-12: 1000 mL via INTRAVENOUS

## 2014-04-11 MED ORDER — SODIUM CHLORIDE 0.9 % IV BOLUS (SEPSIS)
1000.0000 mL | Freq: Once | INTRAVENOUS | Status: AC
  Administered 2014-04-11: 1000 mL via INTRAVENOUS

## 2014-04-11 NOTE — ED Notes (Signed)
PA at bedside.

## 2014-04-11 NOTE — ED Provider Notes (Signed)
[name]: [id number]     Arrival date & time 04/11/14  2026 History   First MD Initiated Contact with Patient 04/11/14 2139     Chief Complaint  Patient presents with  . Back Pain     (Consider location/radiation/quality/duration/timing/severity/associated sxs/prior Treatment) HPI Comments: [name] is a 38 y.o. female with a PMHx of obesity and chronic migraines, who presents to the ED with complaints of sudden onset low back pain x9 hours. States the pain came on suddenly when she went to get up from her desk to use the restroom. States it's 9/10 sharp constant pain radiating into her L side and lateral abdomen, worse with standing upright and movement, improved slightly with ibuprofen 800mg  and sitting. Associated symptoms include one week of dysuria, hematuria, malodorous urine, increased frequency and urgency. Denies fevers, chills, CP, SOB, n/v/d/c, obstipation, melena, hematochezia, vaginal discharge/bleeding, myalgias, arthralgias, paresthesias, weakness, numbness, incontinence, or other cauda equina symptoms. Denies any known trauma or heavy lifting/twisting.  Patient is a 38 y.o. female presenting with back pain. The history is provided by the patient. No language interpreter was used.  Back Pain Location:  Lumbar spine Quality: sharp. Radiates to: L lateral abdomen. Pain severity:  Severe (9/10) Pain is:  Same all the time Onset quality:  Sudden Duration:  9 hours Timing:  Constant Progression:  Worsening Chronicity:  New Context: not lifting heavy objects, not recent injury and not twisting   Context comment:  Urinary symptoms x 1 week Relieved by:  NSAIDs and being still (sitting and 800mg  Advil) Worsened by:  Movement and standing Ineffective treatments:  None tried Associated symptoms: abdominal pain (L lateral, from back) and dysuria   Associated symptoms: no abdominal swelling, no bladder incontinence, no bowel incontinence, no chest pain, no fever, no headaches, no  leg pain, no numbness, no paresthesias, no pelvic pain, no perianal numbness, no tingling and no weakness     Past Medical History  Diagnosis Date  . Obesity   . Migraine    Past Surgical History  Procedure Laterality Date  . Cesarean section      x 4  . Umbilical hernia repair  2007  . Tubal ligation Bilateral 2006  . Colposcopy     Family History  Problem Relation Age of Onset  . Ovarian cancer Mother 61  . Heart failure Mother   . Depression Mother   . Heart failure Father 43    MI  . Diabetes Father     type 2  . Hypertension Father   . Heart disease Sister    History  Substance Use Topics  . Smoking status: Current Every Day Smoker -- 0.50 packs/day  . Smokeless tobacco: Not on file  . Alcohol Use: No   OB History    Gravida Para Term Preterm AB TAB SAB Ectopic Multiple Living   6 4 4  2 1 1   4      Review of Systems  Constitutional: Negative for fever and chills.  Respiratory: Negative for cough and shortness of breath.   Cardiovascular: Negative for chest pain.  Gastrointestinal: Positive for abdominal pain (L lateral, from back). Negative for nausea, vomiting, diarrhea, constipation, blood in stool, abdominal distention, rectal pain and bowel incontinence.  Genitourinary: Positive for dysuria, urgency, frequency, hematuria and flank pain. Negative for bladder incontinence, vaginal bleeding, vaginal discharge and pelvic pain.  Musculoskeletal: Positive for back pain. Negative for myalgias, joint swelling, arthralgias, gait problem, neck pain and neck stiffness.  Skin: Negative for  color change.  Neurological: Negative for dizziness, tingling, tremors, weakness, light-headedness, numbness, headaches and paresthesias.  Psychiatric/Behavioral: Negative for confusion.   10 Systems reviewed and are negative for acute change except as noted in the HPI.    Allergies  Contrast media and Morphine and related  Home Medications   Prior to Admission medications     Medication Sig Start Date End Date Taking? Authorizing Provider  HYDROcodone-acetaminophen (NORCO) 7.5-325 MG per tablet Take 0.5 tablets by mouth every 6 (six) hours as needed for moderate pain.    Historical Provider, MD  ibuprofen (ADVIL,MOTRIN) 200 MG tablet Take 800 mg by mouth every 6 (six) hours as needed for pain.     Historical Provider, MD   BP 116/49 mmHg  Pulse 94  Temp(Src) 98.2 F (36.8 C) (Oral)  Resp 14  Ht 5\' 7"  (1.702 m)  Wt 319 lb (144.697 kg)  BMI 49.95 kg/m2  SpO2 100%  LMP 03/25/2014 Physical Exam  Constitutional: She is oriented to person, place, and time. Vital signs are normal. She appears well-developed and well-nourished.  Non-toxic appearance. No distress.  Afebrile, nontoxic, NAD, obese AAF  HENT:  Head: Normocephalic and atraumatic.  Mouth/Throat: Oropharynx is clear and moist and mucous membranes are normal.  Eyes: Conjunctivae and EOM are normal. Right eye exhibits no discharge. Left eye exhibits no discharge.  Neck: Normal range of motion. Neck supple. No spinous process tenderness and no muscular tenderness present. No rigidity. Normal range of motion present.  Cardiovascular: Normal rate, regular rhythm, normal heart sounds and intact distal pulses.  Exam reveals no gallop and no friction rub.   No murmur heard. Pulmonary/Chest: Effort normal and breath sounds normal. No respiratory distress. She has no decreased breath sounds. She has no wheezes. She has no rhonchi. She has no rales.  Abdominal: Soft. Normal appearance and bowel sounds are normal. She exhibits no distension. There is tenderness in the suprapubic area and left upper quadrant. There is no rigidity, no rebound, no guarding, no CVA tenderness, no tenderness at McBurney's point and negative Murphy's sign.    Soft, obese abdomen which limits exam. Nondistended with mild TTP along lateral left sided abd as well as suprapubic region, +BS throughout, no r/g/r, neg murphy's, neg mcburney's, no  CVA TTP  Musculoskeletal:       Right hip: Normal.       Left hip: Normal.       Cervical back: Normal.       Thoracic back: Normal.       Lumbar back: She exhibits decreased range of motion (due to pain) and tenderness. She exhibits no deformity.       Back:  Body habitus limits exam. Lumbar spine with mildly decreased ROM secondary to pain, mild TTP along midline and L paraspinous muscles but unable to palpate muscles for spasm due to body habitus. Neg SLR bilaterally. Hip ROM intact without any TTP in hips. Strength 5/5 in all extremities, sensation grossly intact in all extremities. Gait nonataxic and mildly antalgic.   Neurological: She is alert and oriented to person, place, and time. She has normal strength. No sensory deficit. Gait normal.  Skin: Skin is warm, dry and intact. No rash noted. No erythema.  No warmth or erythema of skin overlying L flank/low back or L hip  Psychiatric: She has a normal mood and affect.  Nursing note and vitals reviewed.   ED Course  Procedures (including critical care time) Labs Review Labs Reviewed  URINALYSIS, ROUTINE W  REFLEX MICROSCOPIC - Abnormal; Notable for the following:    APPearance HAZY (*)    Hgb urine dipstick SMALL (*)    Ketones, ur 15 (*)    Leukocytes, UA TRACE (*)    All other components within normal limits  URINE MICROSCOPIC-ADD ON - Abnormal; Notable for the following:    Squamous Epithelial / LPF FEW (*)    Bacteria, UA MANY (*)    All other components within normal limits  COMPREHENSIVE METABOLIC PANEL - Abnormal; Notable for the following:    Albumin 3.4 (*)    GFR calc non Af Amer 74 (*)    GFR calc Af Amer 85 (*)    All other components within normal limits  URINE CULTURE  PREGNANCY, URINE  LIPASE, BLOOD  CBC WITH DIFFERENTIAL    Imaging Review No results found.   EKG Interpretation None      MDM   Final diagnoses:  UTI (lower urinary tract infection)  Left-sided low back pain without sciatica    Obesity  Muscle spasm    37y/o female with L lower back pain and urinary symptoms c/w UTI vs pyelonephritis. Upreg neg. U/A with small hgb but 0-2 RBC, trace leuks, 3-6 WBC, few epithelial cells, and many bacteria, consistent with UTI given clinical picture, but could be contaminated. Will obtain culture. Back pain without any red flag s/sx, but given questionable UTI vs pyelo, will obtain CBC w/diff, CMP, and lipase to r/o kidney dysfunction or leukocytosis, as well as pancreatitis although low likelihood given urinary symptoms. Doubt need for imaging at this time. Could still be muscle spasm. Doubt septic joint or disk, doubt cord compression. Plan to tx UTI with abx and pyridium, and tx back pain w/ pain meds and flexeril in case it is a spasm. Will give pain meds and fluids now while awaiting labs.   10:56 PM CMP WNL. Lipase neg. CBC w/diff WNL. Pt still having pain, will give toradol now and apply heat. Will reassess after toradol.  11:50 PM Pain improved with toradol. Continues to state this feels like a muscle spasm in her low back. Declines wanting to try flexeril while here since this will make her drowsier. Upon reassessment, pt having some lower BPs (82/49 now). States she runs low usually, and it's likely that dilaudid pushed her slightly lower. Denies any lightheadedness or dizziness. Will give one more bolus of fluids. Pt ambulating to restroom without assistance but with antalgic gait, although improved from first exam. Will await bolus and recheck BPs, then plan for d/c.  12:51 AM BPs improved after partial bolus, coming up to 100/60. Pain controlled and she states she felt like she could walk more upright when she went to the restroom earlier. Will treat as UTI with keflex, doubt frank pyelo today but could be early pyelo. No red flag s/s of low back pain. No s/s of central cord compression or cauda equina. Lower extremities are neurovascularly intact and patient is ambulating without  difficulty. Discussed good oral hydration and back pain prevention including limiting twisting or heavy lifting.  Rx given for muscle relaxer and counseled on proper use of muscle relaxant medication. Rx given for narcotic pain medicine and counseled on proper use of narcotic pain medications. Told that they can increase to every 4 hrs if needed while pain is worse. Counseled not to combine this medication with others containing tylenol. Urged patient not to drink alcohol, drive, or perform any other activities that requires focus while taking either  of these medications.   Patient urged to follow-up with PCP if pain does not improve with treatment and rest or if pain becomes recurrent. Urged to return with worsening severe pain, loss of bowel or bladder control, trouble walking. The patient verbalizes understanding and agrees with the plan. Will have her see PCP in 1wk for f/up on UTI. I explained the diagnosis and have given explicit precautions to return to the ER including for any other new or worsening symptoms. The patient understands and accepts the medical plan as it's been dictated and I have answered their questions. Discharge instructions concerning home care and prescriptions have been given. The patient is STABLE and is discharged to home in good condition.  BP 100/60 mmHg  Pulse 76  Temp(Src) 98.6 F (37 C) (Oral)  Resp 18  Ht 5\' 7"  (1.702 m)  Wt 319 lb (144.697 kg)  BMI 49.95 kg/m2  SpO2 97%  LMP 03/25/2014  Meds ordered this encounter  Medications  . sodium chloride 0.9 % bolus 1,000 mL    Sig:   . HYDROmorphone (DILAUDID) injection 1 mg    Sig:   . ketorolac (TORADOL) 30 MG/ML injection 30 mg    Sig:   . sodium chloride 0.9 % bolus 1,000 mL    Sig:   . HYDROcodone-acetaminophen (NORCO) 5-325 MG per tablet    Sig: Take 1-2 tablets by mouth every 6 (six) hours as needed for severe pain.    Dispense:  6 tablet    Refill:  0    Order Specific Question:  Supervising  Provider    Answer:  Noemi Chapel D [[id number]]  . naproxen (NAPROSYN) 500 MG tablet    Sig: Take 1 tablet (500 mg total) by mouth 2 (two) times daily as needed for mild pain, moderate pain or headache (TAKE WITH MEALS.).    Dispense:  20 tablet    Refill:  0    Order Specific Question:  Supervising Provider    Answer:  Noemi Chapel D [[id number]]  . cephALEXin (KEFLEX) 500 MG capsule    Sig: 2 caps po bid x 7 days    Dispense:  28 capsule    Refill:  0    Order Specific Question:  Supervising Provider    Answer:  Noemi Chapel D [[id number]]  . phenazopyridine (PYRIDIUM) 200 MG tablet    Sig: Take 1 tablet (200 mg total) by mouth 3 (three) times daily with meals as needed for pain.    Dispense:  6 tablet    Refill:  0    Order Specific Question:  Supervising Provider    Answer:  Noemi Chapel D [[id number]]  . cyclobenzaprine (FLEXERIL) 10 MG tablet    Sig: Take 1 tablet (10 mg total) by mouth 3 (three) times daily as needed for muscle spasms.    Dispense:  15 tablet    Refill:  0    Order Specific Question:  Supervising Provider    Answer:  Johnna Acosta [[id number]]      [place], PA-C 04/12/14 [phone]

## 2014-04-11 NOTE — ED Notes (Signed)
Pt. reports mid/left lower back pain with malodorous urine onset this morning , denies injury or fall , no fever or chills.

## 2014-04-12 MED ORDER — CYCLOBENZAPRINE HCL 10 MG PO TABS: 10.0000 mg | ORAL_TABLET | Freq: Three times a day (TID) | ORAL | Status: DC | PRN

## 2014-04-12 MED ORDER — HYDROCODONE-ACETAMINOPHEN 5-325 MG PO TABS: 1.0000 | ORAL_TABLET | Freq: Four times a day (QID) | ORAL | Status: DC | PRN

## 2014-04-12 MED ORDER — PHENAZOPYRIDINE HCL 200 MG PO TABS: 200.0000 mg | ORAL_TABLET | Freq: Three times a day (TID) | ORAL | Status: DC | PRN

## 2014-04-12 MED ORDER — CEPHALEXIN 500 MG PO CAPS: ORAL_CAPSULE | ORAL | Status: DC

## 2014-04-12 MED ORDER — NAPROXEN 500 MG PO TABS: 500.0000 mg | ORAL_TABLET | Freq: Two times a day (BID) | ORAL | Status: DC | PRN

## 2014-04-12 NOTE — Discharge Instructions (Signed)
Stay very well hydrated with plenty of water throughout the day. Take antibiotic until completed. Use pyridium as directed to decrease painful urination but know that a common side effect is to turn your urine a bright orange/red color. This is not a harmful side effect. Use naprosyn and norco as directed as needed for pain, but don't drive while taking norco. Use flexeril as directed as needed for muscle spasms, but don't drive while taking this medication. Use heat over the affected area, for no more than 34mins at a time every hour. Avoid any heavy lifting over 10 pounds for the next 2 weeks to avoid aggravating your back pain. Follow up with primary care physician in 1 week for recheck of ongoing symptoms but return to ER for emergent changing or worsening of symptoms. If you don't have a primary doctor, use the list below to find one. Please seek immediate care if you develop the following: You develop back pain.  Your symptoms are no better, or worse in 3 days. There is severe back pain or lower abdominal pain.  You develop chills.  You have a fever.  There is nausea or vomiting.  There is continued burning or discomfort with urination.   Back Pain:  Your back pain should be treated with medicines such as ibuprofen or aleve and this back pain should get better over the next 2 weeks.  However if you develop severe or worsening pain, low back pain with fever, numbness, weakness or inability to walk or urinate, you should return to the ER immediately.  Please follow up with your doctor this week for a recheck if still having symptoms.  Low back pain is discomfort in the lower back that may be due to injuries to muscles and ligaments around the spine.  Occasionally, it may be caused by a a problem to a part of the spine called a disc.  The pain may last several days or a week;  However, most patients get completely well in 4 weeks.  Self - care:  The application of heat can help soothe the pain.   Maintaining your daily activities, including walking, is encourged, as it will help you get better faster than just staying in bed. Perform gentle stretching as discussed. Drink plenty of fluids.  Medications are also useful to help with pain control.  A commonly prescribed medications includes acetaminophen.  This medication is generally safe, though you should not take more than 6 of the extra strength (500mg ) pills a day.  Non steroidal anti inflammatory medications including Ibuprofen and naproxen;  These medications help both pain and swelling and are very useful in treating back pain.  They should be taken with food, as they can cause stomach upset, and more seriously, stomach bleeding.    Muscle relaxants:  These medications can help with muscle tightness that is a cause of lower back pain.  Most of these medications can cause drowsiness, and it is not safe to drive or use dangerous machinery while taking them.  SEEK IMMEDIATE MEDICAL ATTENTION IF: New numbness, tingling, weakness, or problem with the use of your arms or legs.  Severe back pain not relieved with medications.  Difficulty with or loss of control of your bowel or bladder control.  Increasing pain in any areas of the body (such as chest or abdominal pain).  Shortness of breath, dizziness or fainting.  Nausea (feeling sick to your stomach), vomiting, fever, or sweats.  You will need to follow up with  Your primary healthcare provider in 1-2 weeks for reassessment.  If you do not have a doctor see the list below.  Emergency Department Resource Guide 1) Find a Doctor and Pay Out of Pocket Although you won't have to find out who is covered by your insurance plan, it is a good idea to ask around and get recommendations. You will then need to call the office and see if the doctor you have chosen will accept you as a new patient and what types of options they offer for patients who are self-pay. Some doctors offer discounts or will  set up payment plans for their patients who do not have insurance, but you will need to ask so you aren't surprised when you get to your appointment.  2) Contact Your Local Health Department Not all health departments have doctors that can see patients for sick visits, but many do, so it is worth a call to see if yours does. If you don't know where your local health department is, you can check in your phone book. The CDC also has a tool to help you locate your state's health department, and many state websites also have listings of all of their local health departments.  3) Find a [facility] If your illness is not likely to be very severe or complicated, you may want to try a walk in clinic. These are popping up all over the country in pharmacies, drugstores, and shopping centers. They're usually staffed by nurse practitioners or physician assistants that have been trained to treat common illnesses and complaints. They're usually fairly quick and inexpensive. However, if you have serious medical issues or chronic medical problems, these are probably not your best option.  No Primary Care Doctor: - Call [facility] at  [phone] - they can help you locate a primary care doctor that  accepts your insurance, provides certain services, etc. - Physician Referral Service- [phone]  Chronic Pain Problems: Organization         Address  Phone   Notes  [facility]  [phone] Patients need to be referred by their primary care doctor.   Medication Assistance: Organization         Address  Phone   Notes  [facility] Medication [facility] [address] [place], [place] [address] [phone] --Must be a resident of [facility] -- Must have NO insurance coverage whatsoever (no Medicaid/ Medicare, etc.) -- The pt. MUST have a primary care doctor that directs their care regularly and follows them in the community   [facility]  [phone]    [place]  [phone]     Dental Care: Organization         Address  Phone  Notes  [facility] Department of [facility] [phone] Accepts children up to age 28 who are enrolled in [place] or [place]; pregnant women with a Medicaid card; and children who have applied for Medicaid or [place] Health Choice, but were declined, whose parents can pay a reduced fee at time of service.  [facility] Department of [facility]  [address], [place] [phone] Accepts children up to age 79 who are enrolled in [place] or [place]; pregnant women with a Medicaid card; and children who have applied for Medicaid or [place] Health Choice, but were declined, whose parents can pay a reduced fee at time  of service.  [place] [facility]  [place] [phone] Patients are seen by appointment only. Walk-ins are not accepted. [place] will see patients 80 years of age and older. Monday - Tuesday (8am-5pm) Most Wednesdays (8:30-5pm) $30 per visit, cash only  [facility] Adult [facility]  [address], [facility] [phone] Patients are seen by appointment only. Walk-ins are not accepted. [place] will see patients 41 years of age and older. One Wednesday Evening (Monthly: Volunteer Based).  $30 per visit, cash only  [place]  [phone] for adults; Children under age 67, call Graduate Pediatric Dentistry at [phone]. Children aged 46-14, please call [phone] to request a pediatric application.  Dental services are provided in all areas of dental care including fillings, crowns and bridges, complete and partial dentures, implants, gum treatment, root canals, and extractions. Preventive care is also provided. Treatment is provided to both adults and children. Patients are selected via a lottery and  there is often a waiting list.   [facility] [address] [place]  [phone] [email or web address]   [facility] [address] [place], [place] [phone], Ext. [phone] Second and Fourth Thursday of each month, opens at 6:30 AM; Clinic ends at 9 AM.  Patients are seen on a first-come first-served basis, and a limited number are seen during each clinic.   [facility]  [address] [place], [place] [phone]   Eligibility Requirements You must have lived in [place], [place], or [place] counties for at least the last three months.   You cannot be eligible for state or federal sponsored [employer], including [employer], [place], or [employer].   You generally cannot be eligible for healthcare insurance through your employer.    How to apply: Eligibility screenings are held every Tuesday and Wednesday afternoon from 1:00 pm until 4:00 pm. You do not need an appointment for the interview!  [facility] [place], [place]   [place]  [place] Department  [place] Department  [phone]      Back Pain, Adult Low back pain is very common. About 1 in 5 people have back pain.The cause of low back pain is rarely dangerous. The pain often gets better over time.About half of people with a sudden onset of back pain feel better in just 2 weeks. About 8 in 10 people feel better by 6 weeks.  CAUSES Some common causes of back pain include:  Strain of the muscles or ligaments supporting the spine.  Wear and tear (degeneration) of the spinal discs.  Arthritis.  Direct injury to the back. DIAGNOSIS Most of the time, the direct cause of low back pain is not known.However, back pain can be treated effectively even when the exact cause of the pain is unknown.Answering your caregiver's questions  about your overall health and symptoms is one of the most accurate ways to make sure the cause of your pain is not dangerous. If your caregiver needs more information, he or she may order lab work or imaging tests (X-rays or MRIs).However, even if imaging tests show changes in your back, this usually does not require surgery. HOME CARE INSTRUCTIONS For many people, back pain returns.Since low back pain is rarely dangerous, it is often a condition that people can learn to [facility] their  own.   Remain active. It is stressful on the back to sit or stand in one place. Do not sit, drive, or stand in one place for more than 30 minutes at a time. Take short walks on level surfaces as soon as pain allows.Try to increase the length of time you walk each day.  Do not stay in bed.Resting more than 1 or 2 days can delay your recovery.  Do not avoid exercise or work.Your body is made to move.It is not dangerous to be active, even though your back may hurt.Your back will likely heal faster if you return to being active before your pain is gone.  Pay attention to your body when you bend and lift. Many people have less discomfortwhen lifting if they bend their knees, keep the load close to their bodies,and avoid twisting. Often, the most comfortable positions are those that put less stress on your recovering back.  Find a comfortable position to sleep. Use a firm mattress and lie on your side with your knees slightly bent. If you lie on your back, put a pillow under your knees.  Only take over-the-counter or prescription medicines as directed by your caregiver. Over-the-counter medicines to reduce pain and inflammation are often the most helpful.Your caregiver may prescribe muscle relaxant drugs.These medicines help dull your pain so you can more quickly return to your normal activities and healthy exercise.  Put ice on the injured area.  Put ice in a plastic bag.  Place a towel between your skin  and the bag.  Leave the ice on for 15-20 minutes, 03-04 times a day for the first 2 to 3 days. After that, ice and heat may be alternated to reduce pain and spasms.  Ask your caregiver about trying back exercises and gentle massage. This may be of some benefit.  Avoid feeling anxious or stressed.Stress increases muscle tension and can worsen back pain.It is important to recognize when you are anxious or stressed and learn ways to manage it.Exercise is a great option. SEEK MEDICAL CARE IF:  You have pain that is not relieved with rest or medicine.  You have pain that does not improve in 1 week.  You have new symptoms.  You are generally not feeling well. SEEK IMMEDIATE MEDICAL CARE IF:   You have pain that radiates from your back into your legs.  You develop new bowel or bladder control problems.  You have unusual weakness or numbness in your arms or legs.  You develop nausea or vomiting.  You develop abdominal pain.  You feel faint. Document Released: 05/27/2005 Document Revised: 11/26/2011 Document Reviewed: 09/28/2013 [facility] Patient Information 2015 [place], [place]. This information is not intended to replace advice given to you by your health care provider. Make sure you discuss any questions you have with your health care provider.  Back Exercises Back exercises help treat and prevent back injuries. The goal is to increase your strength in your belly (abdominal) and back muscles. These exercises can also help with flexibility. Start these exercises when told by your doctor. HOME CARE Back exercises include: Pelvic Tilt.  Lie on your back with your knees bent. Tilt your pelvis until the lower part of your back is against the floor. Hold this position 5 to 10 sec. Repeat this exercise 5 to 10 times. Knee to Chest.  Pull 1 knee up against your chest and hold for 20 to 30 seconds. Repeat this with the other knee. This may be done with the other leg  straight or bent,  whichever feels better. Then, pull both knees up against your chest. Sit-Ups or Curl-Ups.  Bend your knees 90 degrees. Start with tilting your pelvis, and do a partial, slow sit-up. Only lift your upper half 30 to 45 degrees off the floor. Take at least 2 to 3 seonds for each sit-up. Do not do sit-ups with your knees out straight. If partial sit-ups are difficult, simply do the above but with only tightening your belly (abdominal) muscles and holding it as told. Hip-Lift.  Lie on your back with your knees flexed 90 degrees. Push down with your feet and shoulders as you raise your hips 2 inches off the floor. Hold for 10 seconds, repeat 5 to 10 times. Back Arches.  Lie on your stomach. Prop yourself up on bent elbows. Slowly press on your hands, causing an arch in your low back. Repeat 3 to 5 times. Shoulder-Lifts.  Lie face down with arms beside your body. Keep hips and belly pressed to floor as you slowly lift your head and shoulders off the floor. Do not overdo your exercises. Be careful in the beginning. Exercises may cause you some mild back discomfort. If the pain lasts for more than 15 minutes, stop the exercises until you see your doctor. Improvement with exercise for back problems is slow.  Document Released: 06/29/2010 Document Revised: 08/19/2011 Document Reviewed: 03/28/2011 [facility] Patient Information 2015 [place], [place]. This information is not intended to replace advice given to you by your health care provider. Make sure you discuss any questions you have with your health care provider.  Muscle Cramps and Spasms Muscle cramps and spasms occur when a muscle or muscles tighten and you have no control over this tightening (involuntary muscle contraction). They are a common problem and can develop in any muscle. The most common place is in the calf muscles of the leg. Both muscle cramps and muscle spasms are involuntary muscle contractions, but they also have differences:   Muscle  cramps are sporadic and painful. They may last a few seconds to a quarter of an hour. Muscle cramps are often more forceful and last longer than muscle spasms.  Muscle spasms may or may not be painful. They may also last just a few seconds or much longer. CAUSES  It is uncommon for cramps or spasms to be due to a serious underlying problem. In many cases, the cause of cramps or spasms is unknown. Some common causes are:   Overexertion.   Overuse from repetitive motions (doing the same thing over and over).   Remaining in a certain position for a long period of time.   Improper preparation, form, or technique while performing a sport or activity.   Dehydration.   Injury.   Side effects of some medicines.   Abnormally low levels of the salts and ions in your blood (electrolytes), especially potassium and calcium. This could happen if you are taking water pills (diuretics) or you are pregnant.  Some underlying medical problems can make it more likely to develop cramps or spasms. These include, but are not limited to:   Diabetes.   Parkinson disease.   Hormone disorders, such as thyroid problems.   Alcohol abuse.   Diseases specific to muscles, joints, and bones.   Blood vessel disease where not enough blood is getting to the muscles.  HOME CARE INSTRUCTIONS   Stay well hydrated. Drink enough water and fluids to keep your urine clear or pale yellow.  It may be helpful  to massage, stretch, and relax the affected muscle.  For tight or tense muscles, use a warm towel, heating pad, or hot shower water directed to the affected area.  If you are sore or have pain after a cramp or spasm, applying ice to the affected area may relieve discomfort.  Put ice in a plastic bag.  Place a towel between your skin and the bag.  Leave the ice on for 15-20 minutes, 03-04 times a day.  Medicines used to treat a known cause of cramps or spasms may help reduce their frequency or  severity. Only take over-the-counter or prescription medicines as directed by your caregiver. SEEK MEDICAL CARE IF:  Your cramps or spasms get more severe, more frequent, or do not improve over time.  MAKE SURE YOU:   Understand these instructions.  Will watch your condition.  Will get help right away if you are not doing well or get worse. Document Released: 11/16/2001 Document Revised: 09/21/2012 Document Reviewed: 05/13/2012 [facility] Patient Information 2015 [place], [place]. This information is not intended to replace advice given to you by your health care provider. Make sure you discuss any questions you have with your health care provider.  Heat Therapy Heat therapy can help ease sore, stiff, injured, and tight muscles and joints. Heat relaxes your muscles, which may help ease your pain.  RISKS AND COMPLICATIONS If you have any of the following conditions, do not use heat therapy unless your health care provider has approved:  Poor circulation.  Healing wounds or scarred skin in the area being treated.  Diabetes, heart disease, or high blood pressure.  Not being able to feel (numbness) the area being treated.  Unusual swelling of the area being treated.  Active infections.  Blood clots.  Cancer.  Inability to communicate pain. This may include young children and people who have problems with their brain function (dementia).  Pregnancy. Heat therapy should only be used on old, pre-existing, or long-lasting (chronic) injuries. Do not use heat therapy on new injuries unless directed by your health care provider. HOW TO USE HEAT THERAPY There are several different kinds of heat therapy, including:  Moist heat pack.  Warm water bath.  Hot water bottle.  Electric heating pad.  Heated gel pack.  Heated wrap.  Electric heating pad. Use the heat therapy method suggested by your health care provider. Follow your health care provider's instructions on when and how to  use heat therapy. GENERAL HEAT THERAPY RECOMMENDATIONS  Do not sleep while using heat therapy. Only use heat therapy while you are awake.  Your skin may turn pink while using heat therapy. Do not use heat therapy if your skin turns red.  Do not use heat therapy if you have new pain.  High heat or long exposure to heat can cause burns. Be careful when using heat therapy to avoid burning your skin.  Do not use heat therapy on areas of your skin that are already irritated, such as with a rash or sunburn. SEEK MEDICAL CARE IF:  You have blisters, redness, swelling, or numbness.  You have new pain.  Your pain is worse. MAKE SURE YOU:  Understand these instructions.  Will watch your condition.  Will get help right away if you are not doing well or get worse. Document Released: 08/19/2011 Document Revised: 10/11/2013 Document Reviewed: 07/20/2013 [facility] Patient Information 2015 [place], [place]. This information is not intended to replace advice given to you by your health care provider. Make sure you discuss any  questions you have with your health care provider.  Urinary Tract Infection Urinary tract infections (UTIs) can develop anywhere along your urinary tract. Your urinary tract is your body's drainage system for removing wastes and extra water. Your urinary tract includes two kidneys, two ureters, a bladder, and a urethra. Your kidneys are a pair of bean-shaped organs. Each kidney is about the size of your fist. They are located below your ribs, one on each side of your spine. CAUSES Infections are caused by microbes, which are microscopic organisms, including fungi, viruses, and bacteria. These organisms are so small that they can only be seen through a microscope. Bacteria are the microbes that most commonly cause UTIs. SYMPTOMS  Symptoms of UTIs may vary by age and gender of the patient and by the location of the infection. Symptoms in young women typically include a frequent and  intense urge to urinate and a painful, burning feeling in the bladder or urethra during urination. Older women and men are more likely to be tired, shaky, and weak and have muscle aches and abdominal pain. A fever may mean the infection is in your kidneys. Other symptoms of a kidney infection include pain in your back or sides below the ribs, nausea, and vomiting. DIAGNOSIS To diagnose a UTI, your caregiver will ask you about your symptoms. Your caregiver also will ask to provide a urine sample. The urine sample will be tested for bacteria and white blood cells. White blood cells are made by your body to help fight infection. TREATMENT  Typically, UTIs can be treated with medication. Because most UTIs are caused by a bacterial infection, they usually can be treated with the use of antibiotics. The choice of antibiotic and length of treatment depend on your symptoms and the type of bacteria causing your infection. HOME CARE INSTRUCTIONS  If you were prescribed antibiotics, take them exactly as your caregiver instructs you. Finish the medication even if you feel better after you have only taken some of the medication.  Drink enough water and fluids to keep your urine clear or pale yellow.  Avoid caffeine, tea, and carbonated beverages. They tend to irritate your bladder.  Empty your bladder often. Avoid holding urine for long periods of time.  Empty your bladder before and after sexual intercourse.  After a bowel movement, women should cleanse from front to back. Use each tissue only once. SEEK MEDICAL CARE IF:   You have back pain.  You develop a fever.  Your symptoms do not begin to resolve within 3 days. SEEK IMMEDIATE MEDICAL CARE IF:   You have severe back pain or lower abdominal pain.  You develop chills.  You have nausea or vomiting.  You have continued burning or discomfort with urination. MAKE SURE YOU:   Understand these instructions.  Will watch your condition.  Will  get help right away if you are not doing well or get worse. Document Released: 03/06/2005 Document Revised: 11/26/2011 Document Reviewed: 07/05/2011 [facility] Patient Information 2015 [place], [place]. This information is not intended to replace advice given to you by your health care provider. Make sure you discuss any questions you have with your health care provider.

## 2014-04-14 LAB — URINE CULTURE

## 2014-04-15 ENCOUNTER — Telehealth (HOSPITAL_BASED_OUTPATIENT_CLINIC_OR_DEPARTMENT_OTHER): Payer: Self-pay | Admitting: Emergency Medicine

## 2014-04-15 NOTE — Telephone Encounter (Signed)
Post ED Visit - Positive Culture Follow-up  Culture report reviewed by antimicrobial stewardship pharmacist: []  Wes Saluda, Pharm.D., BCPS []  Heide Guile, Pharm.D., BCPS []  Alycia Rossetti, Pharm.D., BCPS [x]  [place], Pharm.D., BCPS, AAHIVP []  Legrand Como, Pharm.D., BCPS, AAHIVP []  Elicia Lamp, Pharm.D.   Positive urine culture  proteus Treated with cephalexin, organism sensitive to the same and no further patient follow-up is required at this time.  Hazle Nordmann 04/15/2014, 1:41 PM

## 2014-05-03 ENCOUNTER — Emergency Department (HOSPITAL_COMMUNITY)
Admission: EM | Admit: 2014-05-03 | Discharge: 2014-05-03 | Disposition: A | Discharge status: Home / Self Care | Payer: Medicaid Other | Attending: Emergency Medicine | Admitting: Emergency Medicine

## 2014-05-03 ENCOUNTER — Encounter (HOSPITAL_COMMUNITY): Payer: Self-pay

## 2014-05-03 DIAGNOSIS — Z9851 Tubal ligation status: Secondary | ICD-10-CM | POA: Diagnosis not present

## 2014-05-03 DIAGNOSIS — Z79899 Other long term (current) drug therapy: Secondary | ICD-10-CM | POA: Insufficient documentation

## 2014-05-03 DIAGNOSIS — Z720 Tobacco use: Secondary | ICD-10-CM | POA: Diagnosis not present

## 2014-05-03 DIAGNOSIS — G43909 Migraine, unspecified, not intractable, without status migrainosus: Secondary | ICD-10-CM | POA: Insufficient documentation

## 2014-05-03 DIAGNOSIS — Z9889 Other specified postprocedural states: Secondary | ICD-10-CM | POA: Insufficient documentation

## 2014-05-03 DIAGNOSIS — Z791 Long term (current) use of non-steroidal anti-inflammatories (NSAID): Secondary | ICD-10-CM | POA: Diagnosis not present

## 2014-05-03 DIAGNOSIS — K625 Hemorrhage of anus and rectum: Secondary | ICD-10-CM | POA: Insufficient documentation

## 2014-05-03 DIAGNOSIS — R109 Unspecified abdominal pain: Secondary | ICD-10-CM | POA: Diagnosis present

## 2014-05-03 DIAGNOSIS — E669 Obesity, unspecified: Secondary | ICD-10-CM | POA: Diagnosis not present

## 2014-05-03 LAB — CBC WITH DIFFERENTIAL/PLATELET
Basophils Absolute: 0 10*3/uL (ref 0.0–0.1)
Basophils Relative: 0 % (ref 0–1)
EOS ABS: 0.2 10*3/uL (ref 0.0–0.7)
Eosinophils Relative: 3 % (ref 0–5)
HCT: 37.6 % (ref 36.0–46.0)
HEMOGLOBIN: 12 g/dL (ref 12.0–15.0)
LYMPHS ABS: 2.8 10*3/uL (ref 0.7–4.0)
Lymphocytes Relative: 38 % (ref 12–46)
MCH: 29.3 pg (ref 26.0–34.0)
MCHC: 31.9 g/dL (ref 30.0–36.0)
MCV: 91.7 fL (ref 78.0–100.0)
MONO ABS: 0.6 10*3/uL (ref 0.1–1.0)
MONOS PCT: 8 % (ref 3–12)
NEUTROS PCT: 51 % (ref 43–77)
Neutro Abs: 3.7 10*3/uL (ref 1.7–7.7)
Platelets: 257 10*3/uL (ref 150–400)
RBC: 4.1 MIL/uL (ref 3.87–5.11)
RDW: 13.5 % (ref 11.5–15.5)
WBC: 7.3 10*3/uL (ref 4.0–10.5)

## 2014-05-03 LAB — COMPREHENSIVE METABOLIC PANEL
ALT: 22 U/L (ref 0–35)
ANION GAP: 13 (ref 5–15)
AST: 16 U/L (ref 0–37)
Albumin: 3.3 g/dL — ABNORMAL LOW (ref 3.5–5.2)
Alkaline Phosphatase: 61 U/L (ref 39–117)
BILIRUBIN TOTAL: 0.8 mg/dL (ref 0.3–1.2)
BUN: 9 mg/dL (ref 6–23)
CO2: 23 mEq/L (ref 19–32)
CREATININE: 0.79 mg/dL (ref 0.50–1.10)
Calcium: 8.9 mg/dL (ref 8.4–10.5)
Chloride: 103 mEq/L (ref 96–112)
GFR calc non Af Amer: >90 mL/min (ref >90)
GLUCOSE: 114 mg/dL — AB (ref 70–99)
POTASSIUM: 4.1 meq/L (ref 3.7–5.3)
Sodium: 139 mEq/L (ref 137–147)
Total Protein: 6.6 g/dL (ref 6.0–8.3)

## 2014-05-03 LAB — POC OCCULT BLOOD, ED: FECAL OCCULT BLD: POSITIVE — AB

## 2014-05-03 MED ORDER — DICYCLOMINE HCL 20 MG PO TABS: 20.0000 mg | ORAL_TABLET | Freq: Two times a day (BID) | ORAL | Status: DC

## 2014-05-03 MED ORDER — HYDROCORTISONE 2.5 % RE CREA: TOPICAL_CREAM | RECTAL | Status: DC

## 2014-05-03 MED ORDER — HYDROCORTISONE ACETATE 25 MG RE SUPP
25.0000 mg | Freq: Once | RECTAL | Status: DC
  Filled 2014-05-03: qty 1

## 2014-05-03 MED ORDER — DICYCLOMINE HCL 10 MG/ML IM SOLN: 20.0000 mg | Freq: Once | INTRAMUSCULAR | Status: DC

## 2014-05-03 NOTE — Discharge Instructions (Signed)
Please follow up with your primary care physician in 1-2 days. If you do not have one please call the [facility] number listed above. Please follow up with the gastroenterologist to schedule a follow up appointment.  Please read all discharge instructions and return precautions.    Bloody Stools Bloody stools often mean that there is a problem in the digestive tract. Your caregiver may use the term "melena" to describe black, tarry, and bad smelling stools or "hematochezia" to describe red or maroon-colored stools. Blood seen in the stool can be caused by bleeding anywhere along the intestinal tract.  A black stool usually means that blood is coming from the upper part of the gastrointestinal tract (esophagus, stomach, or small bowel). Passing maroon-colored stools or bright red blood usually means that blood is coming from lower down in the large bowel or the rectum. However, sometimes massive bleeding in the stomach or small intestine can cause bright red bloody stools.  Consuming black licorice, lead, iron pills, medicines containing bismuth subsalicylate, or blueberries can also cause black stools. Your caregiver can test black stools to see if blood is present. It is important that the cause of the bleeding be found. Treatment can then be started, and the problem can be corrected. Rectal bleeding may not be serious, but you should not assume everything is okay until you know the cause.It is very important to follow up with your caregiver or a specialist in gastrointestinal problems. CAUSES  Blood in the stools can come from various underlying causes.Often, the cause is not found during your first visit. Testing is often needed to discover the cause of bleeding in the gastrointestinal tract. Causes range from simple to serious or even life-threatening.Possible causes include:  Hemorrhoids.These are veins that are full of blood (engorged) in the rectum. They cause pain,  inflammation, and may bleed.  Anal fissures.These are areas of painful tearing which may bleed. They are often caused by passing hard stool.  Diverticulosis.These are pouches that form on the colon over time, with age, and may bleed significantly.  Diverticulitis.This is inflammation in areas with diverticulosis. It can cause pain, fever, and bloody stools, although bleeding is rare.  Proctitis and colitis. These are inflamed areas of the rectum or colon. They may cause pain, fever, and bloody stools.  Polyps and cancer. Colon cancer is a leading cause of preventable cancer death.It often starts out as precancerous polyps that can be removed during a colonoscopy, preventing progression into cancer. Sometimes, polyps and cancer may cause rectal bleeding.  Gastritis and ulcers.Bleeding from the upper gastrointestinal tract (near the stomach) may travel through the intestines and produce black, sometimes tarry, often bad smelling stools. In certain cases, if the bleeding is fast enough, the stools may not be black, but red and the condition may be life-threatening. SYMPTOMS  You may have stools that are bright red and bloody, that are normal color with blood on them, or that are dark black and tarry. In some cases, you may only have blood in the toilet bowl. Any of these cases need medical care. You may also have:  Pain at the anus or anywhere in the rectum.  Lightheadedness or feeling faint.  Extreme weakness.  Nausea or vomiting.  Fever. DIAGNOSIS Your caregiver may use the following methods to find the cause of your bleeding:  Taking a medical history. Age is important. Older people tend to develop polyps and cancer more often. If there is anal pain and a hard,  large stool associated with bleeding, a tear of the anus may be the cause. If blood drips into the toilet after a bowel movement, bleeding hemorrhoids may be the problem. The color and frequency of the bleeding are additional  considerations. In most cases, the medical history provides clues, but seldom the final answer.  A visual and finger (digital) exam. Your caregiver will inspect the anal area, looking for tears and hemorrhoids. A finger exam can provide information when there is tenderness or a growth inside. In men, the prostate is also examined.  Endoscopy. Several types of small, long scopes (endoscopes) are used to view the colon.  In the office, your caregiver may use a rigid, or more commonly, a flexible viewing sigmoidoscope. This exam is called flexible sigmoidoscopy. It is performed in 5 to 10 minutes.  A more thorough exam is accomplished with a colonoscope. It allows your caregiver to view the entire 5 to 6 foot long colon. Medicine to help you relax (sedative) is usually given for this exam. Frequently, a bleeding lesion may be present beyond the reach of the sigmoidoscope. So, a colonoscopy may be the best exam to start with. Both exams are usually done on an outpatient basis. This means the patient does not stay overnight in the hospital or surgery center.  An upper endoscopy may be needed to examine your stomach. Sedation is used and a flexible endoscope is put in your mouth, down to your stomach.  A barium enema X-ray. This is an X-ray exam. It uses liquid barium inserted by enema into the rectum. This test alone may not identify an actual bleeding point. X-rays highlight abnormal shadows, such as those made by lumps (tumors), diverticuli, or colitis. TREATMENT  Treatment depends on the cause of your bleeding.   For bleeding from the stomach or colon, the caregiver doing your endoscopy or colonoscopy may be able to stop the bleeding as part of the procedure.  Inflammation or infection of the colon can be treated with medicines.  Many rectal problems can be treated with creams, suppositories, or warm baths.  Surgery is sometimes needed.  Blood transfusions are sometimes needed if you have lost  a lot of blood.  For any bleeding problem, let your caregiver know if you take aspirin or other blood thinners regularly. HOME CARE INSTRUCTIONS   Take any medicines exactly as prescribed.  Keep your stools soft by eating a diet high in fiber. Prunes (1 to 3 a day) work well for many people.  Drink enough water and fluids to keep your urine clear or pale yellow.  Take sitz baths if advised. A sitz bath is when you sit in a bathtub with warm water for 10 to 15 minutes to soak, soothe, and cleanse the rectal area.  If enemas or suppositories are advised, be sure you know how to use them. Tell your caregiver if you have problems with this.  Monitor your bowel movements to look for signs of improvement or worsening. SEEK MEDICAL CARE IF:   You do not improve in the time expected.  Your condition worsens after initial improvement.  You develop any new symptoms. SEEK IMMEDIATE MEDICAL CARE IF:   You develop severe or prolonged rectal bleeding.  You vomit blood.  You feel weak or faint.  You have a fever. MAKE SURE YOU:  Understand these instructions.  Will watch your condition.  Will get help right away if you are not doing well or get worse. Document Released: 05/17/2002 Document Revised:  08/19/2011 Document Reviewed: 10/12/2010 ExitCare Patient Information 2015 [place], [place]. This information is not intended to replace advice given to you by your health care provider. Make sure you discuss any questions you have with your health care provider.

## 2014-05-03 NOTE — ED Notes (Signed)
PA at bedside.

## 2014-05-03 NOTE — ED Provider Notes (Signed)
[name]: [id number]     Arrival date & time 05/03/14  1658 History   First MD Initiated Contact with Patient 05/03/14 1824     Chief Complaint  Patient presents with  . Abdominal Pain     (Consider location/radiation/quality/duration/timing/severity/associated sxs/prior Treatment) HPI Comments: Patient is a 38 yo F presenting to the ED for continued rectal pain with bright red blood per rectum since 11/19. She was seen and evaluated at [facility] and advised to follow up with her PCP and GI doctor. Patient states she has been unable to do so as "no one will see me without a PCP referral." Alleviating factors: none. Aggravating factors: none. Medications tried prior to arrival: Hemorrhoids cream. She is also complaining of "gassy" abdominal pain. Denies any fevers, chills, nausea, vomiting, diarrhea, constipation, black or tary stools. Abdominal surgical history includes C-section x 4, umbilical hernia repair, Tubal ligation. No history of colonoscopies. No blood thinner or excessive NSAID use.     Past Medical History  Diagnosis Date  . Obesity   . Migraine    Past Surgical History  Procedure Laterality Date  . Cesarean section      x 4  . Umbilical hernia repair  2007  . Tubal ligation Bilateral 2006  . Colposcopy     Family History  Problem Relation Age of Onset  . Ovarian cancer Mother 20  . Heart failure Mother   . Depression Mother   . Heart failure Father 14    MI  . Diabetes Father     type 2  . Hypertension Father   . Heart disease Sister    History  Substance Use Topics  . Smoking status: Current Every Day Smoker -- 0.50 packs/day  . Smokeless tobacco: Not on file  . Alcohol Use: No   OB History    Gravida Para Term Preterm AB TAB SAB Ectopic Multiple Living   6 4 4  2 1 1   4      Review of Systems  Gastrointestinal: Positive for abdominal pain and rectal pain.  All other systems reviewed and are negative.     Allergies  Contrast media and  Morphine and related  Home Medications   Prior to Admission medications   Medication Sig Start Date End Date Taking? Authorizing Provider  pantoprazole (PROTONIX) 40 MG tablet Take 40 mg by mouth daily.   Yes Historical Provider, MD  cephALEXin (KEFLEX) 500 MG capsule 2 caps po bid x 7 days 04/12/14   Mercedes Strupp Camprubi-Soms, PA-C  cyclobenzaprine (FLEXERIL) 10 MG tablet Take 1 tablet (10 mg total) by mouth 3 (three) times daily as needed for muscle spasms. 04/12/14   Mercedes Strupp Camprubi-Soms, PA-C  dicyclomine (BENTYL) 20 MG tablet Take 1 tablet (20 mg total) by mouth 2 (two) times daily. 05/03/14   Henessy Rohrer L Adnan Vanvoorhis, PA-C  HYDROcodone-acetaminophen (NORCO) 5-325 MG per tablet Take 1-2 tablets by mouth every 6 (six) hours as needed for severe pain. 04/12/14   Mercedes Strupp Camprubi-Soms, PA-C  hydrocortisone (ANUSOL-HC) 2.5 % rectal cream Apply rectally 2 times daily 05/03/14   Jazzy Parmer L Quinlyn Tep, PA-C  naproxen (NAPROSYN) 500 MG tablet Take 1 tablet (500 mg total) by mouth 2 (two) times daily as needed for mild pain, moderate pain or headache (TAKE WITH MEALS.). 04/12/14   Mercedes Strupp Camprubi-Soms, PA-C  phenazopyridine (PYRIDIUM) 200 MG tablet Take 1 tablet (200 mg total) by mouth 3 (three) times daily with meals as needed for pain. 04/12/14   Dewitt Hoes  Strupp Camprubi-Soms, PA-C   BP 95/55 mmHg  Pulse 78  Temp(Src) 98 F (36.7 C)  Resp 20  Ht 5\' 7"  (1.702 m)  Wt 308 lb (139.708 kg)  BMI 48.23 kg/m2  SpO2 100%  LMP 03/25/2014 Physical Exam  Constitutional: She is oriented to person, place, and time. She appears well-developed and well-nourished. No distress.  HENT:  Head: Normocephalic and atraumatic.  Right Ear: External ear normal.  Left Ear: External ear normal.  Nose: Nose normal.  Mouth/Throat: Oropharynx is clear and moist.  Eyes: Conjunctivae are normal.  Neck: Normal range of motion. Neck supple.  Cardiovascular: Normal rate, regular rhythm and  normal heart sounds.   Pulmonary/Chest: Effort normal and breath sounds normal.  Abdominal: Soft. Bowel sounds are normal. She exhibits no distension. There is no tenderness. There is no rebound and no guarding.  Genitourinary: Rectal exam shows tenderness. Rectal exam shows no external hemorrhoid, no internal hemorrhoid, no mass and anal tone normal. Guaiac positive stool.  Brown stool on DRE.   Musculoskeletal: Normal range of motion.  Neurological: She is alert and oriented to person, place, and time.  Skin: Skin is warm and dry. She is not diaphoretic.  Psychiatric: She has a normal mood and affect.  Nursing note and vitals reviewed.   ED Course  Procedures (including critical care time) Medications - No data to display  Labs Review Labs Reviewed  COMPREHENSIVE METABOLIC PANEL - Abnormal; Notable for the following:    Glucose, Bld 114 (*)    Albumin 3.3 (*)    All other components within normal limits  POC OCCULT BLOOD, ED - Abnormal; Notable for the following:    Fecal Occult Bld POSITIVE (*)    All other components within normal limits  CBC WITH DIFFERENTIAL    Imaging Review No results found.   EKG Interpretation None      Patient declined pain medications.   Ct scan reviewed [facility] from 11/19 without abnormalities noted. Patient declines repeat CT.  MDM   Final diagnoses:  Rectal bleeding    Filed Vitals:   05/03/14 2236  BP: 95/55  Pulse: 78  Temp:   Resp: 20   Afebrile, NAD, non-toxic appearing, AAOx4.  I have reviewed nursing notes, vital signs, and all appropriate lab and imaging results for this patient. Abdomen soft, non-tender, non-distended. Rectum is tender without mass or hemorrhoid. Brown stool on DRE, hemoccult positive. Hgb and Hct stable. Advised patient she needs to follow up with GI for colonoscopy or other further imaging. Return precautions discussed. Patient is agreeable to plan. Patient is stable at time of  discharge        Harlow Mares, PA-C 05/04/14 0933  Evelina Bucy, MD 05/05/14 [phone]

## 2014-05-03 NOTE — ED Notes (Signed)
Pt does not want to get suppository; pt concerned about more irritation to rectum;PA notified of pt's concerns about med ordered

## 2014-05-03 NOTE — ED Notes (Addendum)
Pt. Is having rectal pain , throbbing Began Sunday evening.  Pt. Was at [facility] on The 19th of November for rectal bleeding and discharged home the same day and too follow-up with GI.  Pt. Reports that rectal bleeding is only when she wipes. But the rectal pain is severe. Pt. Is also very gassy.   Pt. Denies any n/v/d.

## 2014-05-03 NOTE — ED Notes (Signed)
Faxed Release of Info @ 19:49

## 2014-05-10 ENCOUNTER — Ambulatory Visit: Payer: Self-pay | Admitting: Internal Medicine

## 2014-05-19 ENCOUNTER — Encounter: Payer: Self-pay | Admitting: Internal Medicine

## 2014-05-19 ENCOUNTER — Ambulatory Visit: Payer: Medicaid Other | Attending: Internal Medicine | Admitting: Internal Medicine

## 2014-05-19 ENCOUNTER — Telehealth: Payer: Self-pay | Admitting: Internal Medicine

## 2014-05-19 VITALS — BP 104/74 | HR 78 | Temp 98.0°F | Resp 16 | Wt 326.0 lb

## 2014-05-19 DIAGNOSIS — Z8371 Family history of colonic polyps: Secondary | ICD-10-CM | POA: Diagnosis not present

## 2014-05-19 DIAGNOSIS — Z1231 Encounter for screening mammogram for malignant neoplasm of breast: Secondary | ICD-10-CM | POA: Diagnosis not present

## 2014-05-19 DIAGNOSIS — R635 Abnormal weight gain: Secondary | ICD-10-CM | POA: Diagnosis not present

## 2014-05-19 DIAGNOSIS — Z1211 Encounter for screening for malignant neoplasm of colon: Secondary | ICD-10-CM | POA: Diagnosis not present

## 2014-05-19 DIAGNOSIS — F17200 Nicotine dependence, unspecified, uncomplicated: Secondary | ICD-10-CM | POA: Insufficient documentation

## 2014-05-19 DIAGNOSIS — Z170 Estrogen receptor positive status [ER+]: Secondary | ICD-10-CM | POA: Insufficient documentation

## 2014-05-19 DIAGNOSIS — N390 Urinary tract infection, site not specified: Secondary | ICD-10-CM | POA: Diagnosis not present

## 2014-05-19 DIAGNOSIS — R195 Other fecal abnormalities: Secondary | ICD-10-CM

## 2014-05-19 DIAGNOSIS — Z8719 Personal history of other diseases of the digestive system: Secondary | ICD-10-CM

## 2014-05-19 DIAGNOSIS — Z79899 Other long term (current) drug therapy: Secondary | ICD-10-CM | POA: Diagnosis not present

## 2014-05-19 DIAGNOSIS — K625 Hemorrhage of anus and rectum: Secondary | ICD-10-CM | POA: Diagnosis not present

## 2014-05-19 DIAGNOSIS — Z139 Encounter for screening, unspecified: Secondary | ICD-10-CM

## 2014-05-19 LAB — CBC WITH DIFFERENTIAL/PLATELET
BASOS PCT: 1 % (ref 0–1)
Basophils Absolute: 0.1 10*3/uL (ref 0.0–0.1)
EOS ABS: 0.2 10*3/uL (ref 0.0–0.7)
EOS PCT: 3 % (ref 0–5)
HCT: 37.5 % (ref 36.0–46.0)
Hemoglobin: 12.5 g/dL (ref 12.0–15.0)
Lymphocytes Relative: 43 % (ref 12–46)
Lymphs Abs: 2.6 10*3/uL (ref 0.7–4.0)
MCH: 29.8 pg (ref 26.0–34.0)
MCHC: 33.3 g/dL (ref 30.0–36.0)
MCV: 89.5 fL (ref 78.0–100.0)
MPV: 9.8 fL (ref 9.4–12.4)
Monocytes Absolute: 0.6 10*3/uL (ref 0.1–1.0)
Monocytes Relative: 10 % (ref 3–12)
Neutro Abs: 2.6 10*3/uL (ref 1.7–7.7)
Neutrophils Relative %: 43 % (ref 43–77)
PLATELETS: 324 10*3/uL (ref 150–400)
RBC: 4.19 MIL/uL (ref 3.87–5.11)
RDW: 13.5 % (ref 11.5–15.5)
WBC: 6.1 10*3/uL (ref 4.0–10.5)

## 2014-05-19 LAB — COMPLETE METABOLIC PANEL WITH GFR
ALK PHOS: 63 U/L (ref 39–117)
ALT: 24 U/L (ref 0–35)
AST: 21 U/L (ref 0–37)
Albumin: 3.9 g/dL (ref 3.5–5.2)
BILIRUBIN TOTAL: 0.9 mg/dL (ref 0.2–1.2)
BUN: 7 mg/dL (ref 6–23)
CO2: 30 meq/L (ref 19–32)
Calcium: 9.4 mg/dL (ref 8.4–10.5)
Chloride: 105 mEq/L (ref 96–112)
Creat: 0.84 mg/dL (ref 0.50–1.10)
GFR, Est Non African American: 87 mL/min
Glucose, Bld: 88 mg/dL (ref 70–99)
Potassium: 4.7 mEq/L (ref 3.5–5.3)
Sodium: 141 mEq/L (ref 135–145)
Total Protein: 6.7 g/dL (ref 6.0–8.3)

## 2014-05-19 LAB — LIPID PANEL
CHOL/HDL RATIO: 3.9 ratio
Cholesterol: 190 mg/dL (ref 0–200)
HDL: 49 mg/dL (ref >39)
LDL CALC: 115 mg/dL — AB (ref 0–99)
Triglycerides: 132 mg/dL (ref <150)
VLDL: 26 mg/dL (ref 0–40)

## 2014-05-19 LAB — TSH: TSH: 1.28 u[IU]/mL (ref 0.350–4.500)

## 2014-05-19 NOTE — Progress Notes (Signed)
Patient Demographics  [name], is a 38 y.o. female  ONG:[id number]  GMW:[id number]  DOB - 02/20/1976  CC:  Chief Complaint  Patient presents with  . Establish Care       HPI: [name] is a 38 y.o. female here today to establish medical care.she has history of UTI,  recently had been to ER with rectal bleeding, EMR reviewed patient had a fecal occult blood test positive and her CBC was stable, she was advise to make an appointment with GI. She was also prescribed hemorrhoid cream, patient is requesting referral to see GI, she also reports weight gain, questionable family history of thyroid disease, patient declined for flu shot. Patient has No headache, No chest pain, No abdominal pain - No Nausea, No new weakness tingling or numbness, No Cough - SOB.  Allergies  Allergen Reactions  . Contrast Media [Iodinated Diagnostic Agents] Other (See Comments)    Pt became hypotensive,lightheaded,near syncopal the last 2 times she had contrast( prior to 2007)  . Morphine And Related Itching   Past Medical History  Diagnosis Date  . Obesity   . Migraine    Current Outpatient Prescriptions on File Prior to Visit  Medication Sig Dispense Refill  . cephALEXin (KEFLEX) 500 MG capsule 2 caps po bid x 7 days 28 capsule 0  . cyclobenzaprine (FLEXERIL) 10 MG tablet Take 1 tablet (10 mg total) by mouth 3 (three) times daily as needed for muscle spasms. 15 tablet 0  . dicyclomine (BENTYL) 20 MG tablet Take 1 tablet (20 mg total) by mouth 2 (two) times daily. 20 tablet 0  . HYDROcodone-acetaminophen (NORCO) 5-325 MG per tablet Take 1-2 tablets by mouth every 6 (six) hours as needed for severe pain. 6 tablet 0  . hydrocortisone (ANUSOL-HC) 2.5 % rectal cream Apply rectally 2 times daily 30 g 0  . naproxen (NAPROSYN) 500 MG tablet Take 1 tablet (500 mg total) by mouth 2 (two) times daily as needed for mild pain, moderate pain or headache (TAKE WITH MEALS.). 20 tablet 0  . pantoprazole  (PROTONIX) 40 MG tablet Take 40 mg by mouth daily.    . phenazopyridine (PYRIDIUM) 200 MG tablet Take 1 tablet (200 mg total) by mouth 3 (three) times daily with meals as needed for pain. 6 tablet 0   No current facility-administered medications on file prior to visit.   Family History  Problem Relation Age of Onset  . Ovarian cancer Mother 107  . Heart failure Mother   . Depression Mother   . Cancer Mother     uterine cancer   . Heart failure Father 44    MI  . Diabetes Father     type 2  . Hypertension Father   . Heart disease Sister    History   Social History  . Marital Status: Single    Spouse Name: N/A    Number of Children: 4  . Years of Education: N/A   Occupational History  .     Social History Main Topics  . Smoking status: Current Every Day Smoker -- 0.50 packs/day  . Smokeless tobacco: Not on file  . Alcohol Use: No  . Drug Use: No  . Sexual Activity: Not on file   Other Topics Concern  . Not on file   Social History Narrative    Review of Systems: Constitutional: Negative for fever, chills, diaphoresis, activity change, appetite change and fatigue. HENT: Negative for ear pain, nosebleeds, congestion, facial swelling, rhinorrhea,  neck pain, neck stiffness and ear discharge.  Eyes: Negative for pain, discharge, redness, itching and visual disturbance. Respiratory: Negative for cough, choking, chest tightness, shortness of breath, wheezing and stridor.  Cardiovascular: Negative for chest pain, palpitations and leg swelling. Gastrointestinal: Negative for abdominal distention. Genitourinary: Negative for dysuria, urgency, frequency, hematuria, flank pain, decreased urine volume, difficulty urinating and dyspareunia.  Musculoskeletal: Negative for back pain, joint swelling, arthralgia and gait problem. Neurological: Negative for dizziness, tremors, seizures, syncope, facial asymmetry, speech difficulty, weakness, light-headedness, numbness and headaches.    Hematological: Negative for adenopathy. Does not bruise/bleed easily. Psychiatric/Behavioral: Negative for hallucinations, behavioral problems, confusion, dysphoric mood, decreased concentration and agitation.    Objective:   Filed Vitals:   05/19/14 1141  BP: 104/74  Pulse: 78  Temp: 98 F (36.7 C)  Resp: 16    Physical Exam: Constitutional: Patient appears well-developed and well-nourished. No distress. HENT: Normocephalic, atraumatic, External right and left ear normal. Oropharynx is clear and moist.  Eyes: Conjunctivae and EOM are normal. PERRLA, no scleral icterus. Neck: Normal ROM. Neck supple. No JVD. No tracheal deviation. No thyromegaly. CVS: RRR, S1/S2 +, no murmurs, no gallops, no carotid bruit.  Pulmonary: Effort and breath sounds normal, no stridor, rhonchi, wheezes, rales.  Abdominal: Soft. BS +, no distension, tenderness, rebound or guarding.  Musculoskeletal: Normal range of motion. No edema and no tenderness.  Neuro: Alert. Normal reflexes, muscle tone coordination. No cranial nerve deficit. Skin: Skin is warm and dry. No rash noted. Not diaphoretic. No erythema. No pallor. Psychiatric: Normal mood and affect. Behavior, judgment, thought content normal.  Lab Results  Component Value Date   WBC 7.3 05/03/2014   HGB 12.0 05/03/2014   HCT 37.6 05/03/2014   MCV 91.7 05/03/2014   PLT 257 05/03/2014   Lab Results  Component Value Date   CREATININE 0.79 05/03/2014   BUN 9 05/03/2014   NA 139 05/03/2014   K 4.1 05/03/2014   CL 103 05/03/2014   CO2 23 05/03/2014    No results found for: HGBA1C Lipid Panel  No results found for: CHOL, TRIG, HDL, CHOLHDL, VLDL, LDLCALC     Assessment and plan:   1. Occult blood positive stool/3. Special screening for malignant neoplasms, colon/6. History of IBS/5. Family history of colonic polyps Will repeat CBC. - CBC with Differential - Ambulatory referral to Gastroenterology  2. Screening Ordered baseline blood  work. - COMPLETE METABOLIC PANEL WITH GFR - Lipid panel - Vit D  25 hydroxy (rtn osteoporosis monitoring) - Hemoglobin A1c   4. Encounter for screening mammogram for breast cancer  - MM DIGITAL SCREENING BILATERAL; Future  7. Weight gain We'll check TSH level. - TSH     Health Maintenance -Colonoscopy: referred to GI  -Pap Smear: patient follows up with GYN  -Mammogram: ordered   -Vaccinations:  Patient declines flu shot    Return in about 3 months (around 08/18/2014).   Lorayne Marek, MD

## 2014-05-19 NOTE — Progress Notes (Signed)
Patient here to establish care Has been seen several times in the ER for rectal bleeding Patient states she has been feeling fatigued for the past month Patient is also concerned she has been putting on a lot of weight  Patient has lost her dad and her sister at a young age due to embolisms

## 2014-05-19 NOTE — Telephone Encounter (Signed)
Pt dropped off paperwork for work explaining pt's medical condition that has led to dr's appts and ED visits. Please f/u with pt.

## 2014-05-19 NOTE — Patient Instructions (Signed)
Smoking Cessation Quitting smoking is important to your health and has many advantages. However, it is not always easy to quit since nicotine is a very addictive drug. Oftentimes, people try 3 times or more before being able to quit. This document explains the best ways for you to prepare to quit smoking. Quitting takes hard work and a lot of effort, but you can do it. ADVANTAGES OF QUITTING SMOKING  You will live longer, feel better, and live better.  Your body will feel the impact of quitting smoking almost immediately.  Within 20 minutes, blood pressure decreases. Your pulse returns to its normal level.  After 8 hours, carbon monoxide levels in the blood return to normal. Your oxygen level increases.  After 24 hours, the chance of having a heart attack starts to decrease. Your breath, hair, and body stop smelling like smoke.  After 48 hours, damaged nerve endings begin to recover. Your sense of taste and smell improve.  After 72 hours, the body is virtually free of nicotine. Your bronchial tubes relax and breathing becomes easier.  After 2 to 12 weeks, lungs can hold more air. Exercise becomes easier and circulation improves.  The risk of having a heart attack, stroke, cancer, or lung disease is greatly reduced.  After 1 year, the risk of coronary heart disease is cut in half.  After 5 years, the risk of stroke falls to the same as a nonsmoker.  After 10 years, the risk of lung cancer is cut in half and the risk of other cancers decreases significantly.  After 15 years, the risk of coronary heart disease drops, usually to the level of a nonsmoker.  If you are pregnant, quitting smoking will improve your chances of having a healthy baby.  The people you live with, especially any children, will be healthier.  You will have extra money to spend on things other than cigarettes. QUESTIONS TO THINK ABOUT BEFORE ATTEMPTING TO QUIT You may want to talk about your answers with your  health care provider.  Why do you want to quit?  If you tried to quit in the past, what helped and what did not?  What will be the most difficult situations for you after you quit? How will you plan to handle them?  Who can help you through the tough times? Your family? Friends? A health care provider?  What pleasures do you get from smoking? What ways can you still get pleasure if you quit? Here are some questions to ask your health care provider:  How can you help me to be successful at quitting?  What medicine do you think would be best for me and how should I take it?  What should I do if I need more help?  What is smoking withdrawal like? How can I get information on withdrawal? GET READY  Set a quit date.  Change your environment by getting rid of all cigarettes, ashtrays, matches, and lighters in your home, car, or work. Do not let people smoke in your home.  Review your past attempts to quit. Think about what worked and what did not. GET SUPPORT AND ENCOURAGEMENT You have a better chance of being successful if you have help. You can get support in many ways.  Tell your family, friends, and coworkers that you are going to quit and need their support. Ask them not to smoke around you.  Get individual, group, or telephone counseling and support. Programs are available at local hospitals and health centers. Call   your local health department for information about programs in your area.  Spiritual beliefs and practices may help some smokers quit.  Download a "quit meter" on your computer to keep track of quit statistics, such as how long you have gone without smoking, cigarettes not smoked, and money saved.  Get a self-help book about quitting smoking and staying off tobacco. LEARN NEW SKILLS AND BEHAVIORS  Distract yourself from urges to smoke. Talk to someone, go for a walk, or occupy your time with a task.  Change your normal routine. Take a different route to work.  Drink tea instead of coffee. Eat breakfast in a different place.  Reduce your stress. Take a hot bath, exercise, or read a book.  Plan something enjoyable to do every day. Reward yourself for not smoking.  Explore interactive web-based programs that specialize in helping you quit. GET MEDICINE AND USE IT CORRECTLY Medicines can help you stop smoking and decrease the urge to smoke. Combining medicine with the above behavioral methods and support can greatly increase your chances of successfully quitting smoking.  Nicotine replacement therapy helps deliver nicotine to your body without the negative effects and risks of smoking. Nicotine replacement therapy includes nicotine gum, lozenges, inhalers, nasal sprays, and skin patches. Some may be available over-the-counter and others require a prescription.  Antidepressant medicine helps people abstain from smoking, but how this works is unknown. This medicine is available by prescription.  Nicotinic receptor partial agonist medicine simulates the effect of nicotine in your brain. This medicine is available by prescription. Ask your health care provider for advice about which medicines to use and how to use them based on your health history. Your health care provider will tell you what side effects to look out for if you choose to be on a medicine or therapy. Carefully read the information on the package. Do not use any other product containing nicotine while using a nicotine replacement product.  RELAPSE OR DIFFICULT SITUATIONS Most relapses occur within the first 3 months after quitting. Do not be discouraged if you start smoking again. Remember, most people try several times before finally quitting. You may have symptoms of withdrawal because your body is used to nicotine. You may crave cigarettes, be irritable, feel very hungry, cough often, get headaches, or have difficulty concentrating. The withdrawal symptoms are only temporary. They are strongest  when you first quit, but they will go away within 10-14 days. To reduce the chances of relapse, try to:  Avoid drinking alcohol. Drinking lowers your chances of successfully quitting.  Reduce the amount of caffeine you consume. Once you quit smoking, the amount of caffeine in your body increases and can give you symptoms, such as a rapid heartbeat, sweating, and anxiety.  Avoid smokers because they can make you want to smoke.  Do not let weight gain distract you. Many smokers will gain weight when they quit, usually less than 10 pounds. Eat a healthy diet and stay active. You can always lose the weight gained after you quit.  Find ways to improve your mood other than smoking. FOR MORE INFORMATION  www.smokefree.gov  Document Released: 05/21/2001 Document Revised: 10/11/2013 Document Reviewed: 09/05/2011 [employer] Patient Information 2015 [employer]. This information is not intended to replace advice given to you by your health care provider. Make sure you discuss any questions you have with your health care provider.  

## 2014-05-20 ENCOUNTER — Telehealth: Payer: Self-pay

## 2014-05-20 LAB — VITAMIN D 25 HYDROXY (VIT D DEFICIENCY, FRACTURES): Vit D, 25-Hydroxy: 8 ng/mL — ABNORMAL LOW (ref 30–100)

## 2014-05-20 LAB — HEMOGLOBIN A1C
Hgb A1c MFr Bld: 5.2 % (ref <5.7)
Mean Plasma Glucose: 103 mg/dL (ref <117)

## 2014-05-20 MED ORDER — VITAMIN D (ERGOCALCIFEROL) 1.25 MG (50000 UNIT) PO CAPS: 50000.0000 [IU] | ORAL_CAPSULE | ORAL | Status: DC

## 2014-05-20 NOTE — Telephone Encounter (Signed)
-----   Message from Lorayne Marek, MD sent at 05/20/2014 10:15 AM EST ----- Blood work reviewed, noticed low vitamin D, call patient advise to start ergocalciferol 50,000 units once a week for the duration of  12 weeks. Rest of the blood work is normal

## 2014-05-20 NOTE — Telephone Encounter (Signed)
Patient is aware of her lab results and prescription sent to pharmacy on file

## 2014-06-01 ENCOUNTER — Telehealth: Payer: Self-pay | Admitting: Internal Medicine

## 2014-06-01 ENCOUNTER — Telehealth: Payer: Self-pay

## 2014-06-01 MED ORDER — FLUCONAZOLE 150 MG PO TABS: 150.0000 mg | ORAL_TABLET | Freq: Once | ORAL | Status: DC

## 2014-06-01 NOTE — Telephone Encounter (Signed)
Patient called complaining of vaginal itching that is so bad she is scratching herself Raw in the area Patient is requesting a medication for yeast  Per Dr Annitta Needs we will send a prescription for diflucan one time dose

## 2014-06-01 NOTE — Telephone Encounter (Signed)
Pt has contacted [facility] for referral but still waiting for appt.  Pt to see if Dr/nurse can call in a script for a yeast infection she has developed as a result of taking an antibiotic, pt shops at [facility] on [address]. Please f/u with pt.

## 2014-06-24 ENCOUNTER — Telehealth: Payer: Self-pay | Admitting: Internal Medicine

## 2014-06-30 NOTE — Telephone Encounter (Signed)
Pt calling to f/u on form dropped off to be filled out regarding GI condition.  Pt states that if form needs to be filled out by GI physician, that needs to be specified on form because she is about to lose job. Please f/u with pt.

## 2014-07-05 ENCOUNTER — Ambulatory Visit (HOSPITAL_COMMUNITY)
Admission: RE | Admit: 2014-07-05 | Discharge: 2014-07-05 | Disposition: A | Discharge status: Home / Self Care | Payer: Medicaid Other | Source: Ambulatory Visit | Attending: Internal Medicine | Admitting: Internal Medicine

## 2014-07-05 DIAGNOSIS — Z1231 Encounter for screening mammogram for malignant neoplasm of breast: Secondary | ICD-10-CM

## 2014-07-08 ENCOUNTER — Telehealth: Payer: Self-pay | Admitting: Internal Medicine

## 2014-07-08 ENCOUNTER — Telehealth: Payer: Self-pay

## 2014-07-08 NOTE — Telephone Encounter (Signed)
Patient called to make an appointment for her shoulder pain Patient also following up on paperwork she dropped off at her first appointment i told the patient i will try to locate her paperwork and in the meantime the call Was transferred to front desk to make an appointment to be seen

## 2014-07-08 NOTE — Telephone Encounter (Signed)
Patient is calling to check on the request of the letter she needed for her employer, patient states that she needed the letter last week and is about to lose her job. Please f/u with patient as soon as possible.

## 2014-07-11 ENCOUNTER — Telehealth: Payer: Self-pay

## 2014-07-11 NOTE — Telephone Encounter (Signed)
Attempted to call patient to find out if and when she returned back to work Physician needs information to fill out work related paperwork Patient not available Message left on machine to return our call

## 2014-07-19 ENCOUNTER — Telehealth: Payer: Self-pay

## 2014-07-19 ENCOUNTER — Telehealth: Payer: Self-pay | Admitting: Internal Medicine

## 2014-07-19 NOTE — Telephone Encounter (Signed)
Pt calling to f/u on paperwork previously spoke to nurse, please f/u with pt.

## 2014-07-19 NOTE — Telephone Encounter (Signed)
Patient called to notify us of her status for her paperwork At her job .She needs the paper work to reflect that she will be out periodically when  She is having stomach problems.  At her job they have occurences-it does not matter  If you are five minutes late or out the whole day- generates an occurrence After so many occurences there is corrective action She needs this paper work to secure her job

## 2014-07-27 ENCOUNTER — Encounter: Payer: Self-pay | Admitting: Internal Medicine

## 2014-07-27 ENCOUNTER — Ambulatory Visit: Payer: Medicaid Other | Attending: Internal Medicine | Admitting: Internal Medicine

## 2014-07-27 DIAGNOSIS — R195 Other fecal abnormalities: Secondary | ICD-10-CM | POA: Diagnosis not present

## 2014-07-27 DIAGNOSIS — Z79899 Other long term (current) drug therapy: Secondary | ICD-10-CM | POA: Diagnosis not present

## 2014-07-27 DIAGNOSIS — Z833 Family history of diabetes mellitus: Secondary | ICD-10-CM | POA: Diagnosis not present

## 2014-07-27 DIAGNOSIS — Z8249 Family history of ischemic heart disease and other diseases of the circulatory system: Secondary | ICD-10-CM | POA: Insufficient documentation

## 2014-07-27 DIAGNOSIS — E559 Vitamin D deficiency, unspecified: Secondary | ICD-10-CM

## 2014-07-27 DIAGNOSIS — R600 Localized edema: Secondary | ICD-10-CM

## 2014-07-27 DIAGNOSIS — R635 Abnormal weight gain: Secondary | ICD-10-CM | POA: Diagnosis present

## 2014-07-27 DIAGNOSIS — F17210 Nicotine dependence, cigarettes, uncomplicated: Secondary | ICD-10-CM | POA: Insufficient documentation

## 2014-07-27 DIAGNOSIS — R609 Edema, unspecified: Secondary | ICD-10-CM | POA: Insufficient documentation

## 2014-07-27 DIAGNOSIS — Z720 Tobacco use: Secondary | ICD-10-CM

## 2014-07-27 DIAGNOSIS — F17200 Nicotine dependence, unspecified, uncomplicated: Secondary | ICD-10-CM

## 2014-07-27 MED ORDER — FUROSEMIDE 20 MG PO TABS
20.0000 mg | ORAL_TABLET | Freq: Every day | ORAL | Status: DC
Start: 2014-07-27 — End: 2014-10-14

## 2014-07-27 NOTE — Progress Notes (Signed)
Patient here for follow up Concerned about her current weight gain Having bilateral swelling to her ankles for the past three weeks Still having some blood in her stool but will follow up with GI

## 2014-07-27 NOTE — Progress Notes (Signed)
MRN: [id number] Name: [name]  Sex: female Age: 39 y.o. DOB: 11-10-1975  Allergies: Contrast media and Morphine and related  Chief Complaint  Patient presents with  . Follow-up    HPI: Patient is 39 y.o. female who history of rectal bleeding/hemorrhoids, currently scheduled to follow with GI, today her major concern is weight gain as well as pedal edema, she denies any orthopnea or PND  , denies any numbness weakness, her blood pressure is controlled, she had a blood work done which was reviewed with the patient noticed vitamin D deficiency. She already had a mammogram done. As per patient she had a Pap smear done within last one or 2 years.  Past Medical History  Diagnosis Date  . Obesity   . Migraine     Past Surgical History  Procedure Laterality Date  . Cesarean section      x 4  . Umbilical hernia repair  2007  . Tubal ligation Bilateral 2006  . Colposcopy        Medication List       This list is accurate as of: 07/27/14  5:47 PM.  Always use your most recent med list.               cephALEXin 500 MG capsule  Commonly known as:  KEFLEX  2 caps po bid x 7 days     cyclobenzaprine 10 MG tablet  Commonly known as:  FLEXERIL  Take 1 tablet (10 mg total) by mouth 3 (three) times daily as needed for muscle spasms.     dicyclomine 20 MG tablet  Commonly known as:  BENTYL  Take 1 tablet (20 mg total) by mouth 2 (two) times daily.     fluconazole 150 MG tablet  Commonly known as:  DIFLUCAN  Take 1 tablet (150 mg total) by mouth once.     furosemide 20 MG tablet  Commonly known as:  LASIX  Take 1 tablet (20 mg total) by mouth daily.     HYDROcodone-acetaminophen 5-325 MG per tablet  Commonly known as:  NORCO  Take 1-2 tablets by mouth every 6 (six) hours as needed for severe pain.     hydrocortisone 2.5 % rectal cream  Commonly known as:  ANUSOL-HC  Apply rectally 2 times daily     naproxen 500 MG tablet  Commonly known as:  NAPROSYN  Take  1 tablet (500 mg total) by mouth 2 (two) times daily as needed for mild pain, moderate pain or headache (TAKE WITH MEALS.).     pantoprazole 40 MG tablet  Commonly known as:  PROTONIX  Take 40 mg by mouth daily.     phenazopyridine 200 MG tablet  Commonly known as:  PYRIDIUM  Take 1 tablet (200 mg total) by mouth 3 (three) times daily with meals as needed for pain.     Vitamin D (Ergocalciferol) 50000 UNITS Caps capsule  Commonly known as:  DRISDOL  Take 1 capsule (50,000 Units total) by mouth every 7 (seven) days.        Meds ordered this encounter  Medications  . furosemide (LASIX) 20 MG tablet    Sig: Take 1 tablet (20 mg total) by mouth daily.    Dispense:  10 tablet    Refill:  0    Immunization History  Administered Date(s) Administered  . Td 06/10/1990    Family History  Problem Relation Age of Onset  . Ovarian cancer Mother 84  . Heart failure Mother   .  Depression Mother   . Cancer Mother     uterine cancer   . Heart failure Father 76    MI  . Diabetes Father     type 2  . Hypertension Father   . Heart disease Sister     History  Substance Use Topics  . Smoking status: Current Every Day Smoker -- 0.50 packs/day  . Smokeless tobacco: Not on file  . Alcohol Use: No    Review of Systems   As noted in HPI  Filed Vitals:   07/27/14 1706  BP: 115/66  Pulse: 76  Temp: 98 F (36.7 C)  Resp: 16    Physical Exam  Physical Exam  Constitutional:  Obese female sitting comfortably not in acute distress  Eyes: EOM are normal. Pupils are equal, round, and reactive to light.  Cardiovascular: Normal rate and regular rhythm.   Pulmonary/Chest: Breath sounds normal. No respiratory distress. She has no wheezes. She has no rales.  Musculoskeletal:  1+ ankle edema, 2+ dorsalis pedis pulse, good range of motion at the ankle    CBC    Component Value Date/Time   WBC 6.1 05/19/2014 1221   RBC 4.19 05/19/2014 1221   HGB 12.5 05/19/2014 1221   HCT 37.5  05/19/2014 1221   PLT 324 05/19/2014 1221   MCV 89.5 05/19/2014 1221   LYMPHSABS 2.6 05/19/2014 1221   MONOABS 0.6 05/19/2014 1221   EOSABS 0.2 05/19/2014 1221   BASOSABS 0.1 05/19/2014 1221    CMP     Component Value Date/Time   NA 141 05/19/2014 1221   K 4.7 05/19/2014 1221   CL 105 05/19/2014 1221   CO2 30 05/19/2014 1221   GLUCOSE 88 05/19/2014 1221   BUN 7 05/19/2014 1221   CREATININE 0.84 05/19/2014 1221   CREATININE 0.79 05/03/2014 1721   CALCIUM 9.4 05/19/2014 1221   PROT 6.7 05/19/2014 1221   ALBUMIN 3.9 05/19/2014 1221   AST 21 05/19/2014 1221   ALT 24 05/19/2014 1221   ALKPHOS 63 05/19/2014 1221   BILITOT 0.9 05/19/2014 1221   GFRNONAA 87 05/19/2014 1221   GFRNONAA >90 05/03/2014 1721   GFRAA >89 05/19/2014 1221   GFRAA >90 05/03/2014 1721    Lab Results  Component Value Date/Time   CHOL 190 05/19/2014 12:21 PM    No components found for: HGA1C  Lab Results  Component Value Date/Time   AST 21 05/19/2014 12:21 PM    Assessment and Plan  Morbid obesity - Plan: Amb ref to Medical Nutrition Therapy-MNT, Also advise patient for exercise  Occult blood positive stool Patient is scheduled for colonoscopy with GI.  Pedal edema - Plan: advised patient for leg elevation, reduce the salt intake, I prescribed furosemide (LASIX) 20 MG tablet she will take for 10 days.  Smoking Again counseled patient to quit smoking.  Vitamin D deficiency Currently patient is taking vitamin D supplement.  Health Maintenance -Colonoscopy: patient is scheduled -Mammogram:up-to-date -Vaccinations:  Patient declined flu shot  Return in about 3 months (around 10/25/2014).   This note has been created with Surveyor, quantity. Any transcriptional errors are unintentional.    Lorayne Marek, MD

## 2014-07-27 NOTE — Patient Instructions (Signed)

## 2014-07-29 ENCOUNTER — Ambulatory Visit: Payer: Self-pay | Admitting: Skilled Nursing Facility1

## 2014-08-02 ENCOUNTER — Telehealth: Payer: Self-pay | Admitting: Emergency Medicine

## 2014-08-02 ENCOUNTER — Telehealth: Payer: Self-pay | Admitting: Internal Medicine

## 2014-08-02 NOTE — Telephone Encounter (Signed)
Attempted to return pt message; number listed unable to accept incoming call at this time

## 2014-08-02 NOTE — Telephone Encounter (Signed)
Patient called stating that her legs and feet are swollen, patient states that when she walks she gets out of breath. Patient would like to speak to a doctor. Please f/u with pt.

## 2014-08-04 ENCOUNTER — Telehealth: Payer: Self-pay | Admitting: Internal Medicine

## 2014-08-04 NOTE — Telephone Encounter (Signed)
Patient has been out of work since Sunday 07/31/2014 until her procedure. Patient would like to speak to nurse and see her PCP.

## 2014-08-04 NOTE — Telephone Encounter (Signed)
Pt want to notified Dr Annitta Needs been out of work since 07/31/2014 due to swelling on legs and SOB

## 2014-08-09 ENCOUNTER — Encounter: Payer: Self-pay | Admitting: Family Medicine

## 2014-08-09 ENCOUNTER — Ambulatory Visit: Payer: Medicaid Other | Attending: Internal Medicine | Admitting: Family Medicine

## 2014-08-09 VITALS — BP 112/78 | HR 86 | Temp 98.5°F | Resp 16 | Wt 335.4 lb

## 2014-08-09 DIAGNOSIS — R0602 Shortness of breath: Secondary | ICD-10-CM | POA: Insufficient documentation

## 2014-08-09 DIAGNOSIS — R609 Edema, unspecified: Secondary | ICD-10-CM | POA: Insufficient documentation

## 2014-08-09 DIAGNOSIS — R635 Abnormal weight gain: Secondary | ICD-10-CM | POA: Diagnosis not present

## 2014-08-09 DIAGNOSIS — K625 Hemorrhage of anus and rectum: Secondary | ICD-10-CM | POA: Insufficient documentation

## 2014-08-09 LAB — COMPREHENSIVE METABOLIC PANEL
ALBUMIN: 3.7 g/dL (ref 3.5–5.2)
ALK PHOS: 59 U/L (ref 39–117)
ALT: 24 U/L (ref 0–35)
AST: 20 U/L (ref 0–37)
BUN: 10 mg/dL (ref 6–23)
CO2: 26 mEq/L (ref 19–32)
Calcium: 9 mg/dL (ref 8.4–10.5)
Chloride: 106 mEq/L (ref 96–112)
Creat: 0.77 mg/dL (ref 0.50–1.10)
Glucose, Bld: 81 mg/dL (ref 70–99)
POTASSIUM: 4.9 meq/L (ref 3.5–5.3)
Sodium: 142 mEq/L (ref 135–145)
Total Bilirubin: 1.1 mg/dL (ref 0.2–1.2)
Total Protein: 6.1 g/dL (ref 6.0–8.3)

## 2014-08-09 LAB — CBC WITH DIFFERENTIAL/PLATELET
BASOS ABS: 0.1 10*3/uL (ref 0.0–0.1)
BASOS PCT: 1 % (ref 0–1)
EOS PCT: 3 % (ref 0–5)
Eosinophils Absolute: 0.2 10*3/uL (ref 0.0–0.7)
HCT: 38.5 % (ref 36.0–46.0)
HEMOGLOBIN: 12.5 g/dL (ref 12.0–15.0)
LYMPHS PCT: 45 % (ref 12–46)
Lymphs Abs: 2.5 10*3/uL (ref 0.7–4.0)
MCH: 29.4 pg (ref 26.0–34.0)
MCHC: 32.5 g/dL (ref 30.0–36.0)
MCV: 90.6 fL (ref 78.0–100.0)
MPV: 10.3 fL (ref 8.6–12.4)
Monocytes Absolute: 0.4 10*3/uL (ref 0.1–1.0)
Monocytes Relative: 7 % (ref 3–12)
Neutro Abs: 2.5 10*3/uL (ref 1.7–7.7)
Neutrophils Relative %: 44 % (ref 43–77)
PLATELETS: 317 10*3/uL (ref 150–400)
RBC: 4.25 MIL/uL (ref 3.87–5.11)
RDW: 13.4 % (ref 11.5–15.5)
WBC: 5.6 10*3/uL (ref 4.0–10.5)

## 2014-08-09 NOTE — Patient Instructions (Signed)

## 2014-08-09 NOTE — Progress Notes (Signed)
   Subjective:    Patient ID: [name], female    DOB: Sep 25, 1975, 39 y.o.   MRN: [id number]  HPI Pt presents today for follow-up of lower leg and ankle edema.  She was seen by Dr. Eino Farber in last couple of weeks. She was prescribed furosimide 20mg  daily for 10 days. She did not notice any difference in the swelling.  She also c./o SOB with exertion and is concerned that it is her heart. She has gained about 70 pounds over the last nine months or so.  He has an upcoming appt for a colonscopy due to rectal bleeding and has an upcoming appointment with nutrition. She has not been to work since her last visit. Her job is sitting all day. She reports noticing her legs being shiny and she does not to shave her legs very often    Review of Systems     Objective:   Physical Exam  Pt is alert, oriented and appropriate. Neck is supply FROM w/o adenopathy or tenderness. Lungs are clear to auscultation. HS are regular. There is 1+ edema of the lower legs and ankles, The skin is shiny and dry. Pedal pulses are 2+ bilaterally       Assessment & Plan:  I suspect we are dealing with venous insufficiency due to the weight gain. Today she are doing blood work to r/o any obvious conditions such as anemia, electrolyte imbalance or thyroid conditions. We will follow-up once those results are in. I cannot really tell her what to do about work. Her condition is not really a condition that makes work contriindicated.

## 2014-08-09 NOTE — Progress Notes (Signed)
Patient complains of bilateral swelling to her lower extremities as well As her arms Patient has been slowly gaining weight and is concerned about that as well

## 2014-08-10 LAB — T4, FREE: FREE T4: 0.97 ng/dL (ref 0.80–1.80)

## 2014-08-10 LAB — TSH: TSH: 1.304 u[IU]/mL (ref 0.350–4.500)

## 2014-08-11 ENCOUNTER — Other Ambulatory Visit: Payer: Self-pay | Admitting: Gastroenterology

## 2014-08-17 ENCOUNTER — Telehealth: Payer: Self-pay | Admitting: Internal Medicine

## 2014-08-22 ENCOUNTER — Ambulatory Visit: Payer: Medicaid Other | Attending: Internal Medicine | Admitting: Internal Medicine

## 2014-08-22 ENCOUNTER — Encounter: Payer: Self-pay | Admitting: Internal Medicine

## 2014-08-22 VITALS — BP 110/76 | HR 87 | Temp 98.8°F | Resp 16 | Wt 330.0 lb

## 2014-08-22 DIAGNOSIS — M79672 Pain in left foot: Secondary | ICD-10-CM | POA: Insufficient documentation

## 2014-08-22 DIAGNOSIS — M79671 Pain in right foot: Secondary | ICD-10-CM | POA: Insufficient documentation

## 2014-08-22 DIAGNOSIS — R0602 Shortness of breath: Secondary | ICD-10-CM | POA: Diagnosis not present

## 2014-08-22 DIAGNOSIS — Z8049 Family history of malignant neoplasm of other genital organs: Secondary | ICD-10-CM | POA: Diagnosis not present

## 2014-08-22 DIAGNOSIS — Z833 Family history of diabetes mellitus: Secondary | ICD-10-CM | POA: Diagnosis not present

## 2014-08-22 DIAGNOSIS — F17210 Nicotine dependence, cigarettes, uncomplicated: Secondary | ICD-10-CM | POA: Insufficient documentation

## 2014-08-22 DIAGNOSIS — E559 Vitamin D deficiency, unspecified: Secondary | ICD-10-CM | POA: Diagnosis not present

## 2014-08-22 DIAGNOSIS — R609 Edema, unspecified: Secondary | ICD-10-CM | POA: Diagnosis present

## 2014-08-22 DIAGNOSIS — R600 Localized edema: Secondary | ICD-10-CM

## 2014-08-22 DIAGNOSIS — Z8249 Family history of ischemic heart disease and other diseases of the circulatory system: Secondary | ICD-10-CM | POA: Diagnosis not present

## 2014-08-22 NOTE — Progress Notes (Signed)
MRN: [id number] Name: [name]  Sex: female Age: 39 y.o. DOB: [birth year]  Allergies: Contrast media and Morphine and related  Chief Complaint  Patient presents with  . Follow-up    HPI: Patient is 40 y.o. female who has history of of morbid obesity, bleeding per rectum, recently had colonoscopy done and she reported polyps were removed, 2 weeks ago she was seen by nurse practitioner in this clinic for pedal edema, she has tried Lasix prior to that without much improvement, she does report exertional shortness of breath and is concerned if it is because of heart, though her lower extremity swelling is improved, patient reported that on and off her feet get swollen and it becomes difficult to wear the shoes and occasionally has pain in the feet, she denies any recent fall or trauma, she is requesting a note to go back to work.patient recently had a blood work done which was reviewed and that was normal.  Past Medical History  Diagnosis Date  . Obesity   . Migraine     Past Surgical History  Procedure Laterality Date  . Cesarean section      x 4  . Umbilical hernia repair  2007  . Tubal ligation Bilateral 2006  . Colposcopy        Medication List       This list is accurate as of: 08/22/14  3:57 PM.  Always use your most recent med list.               cephALEXin 500 MG capsule  Commonly known as:  KEFLEX  2 caps po bid x 7 days     cyclobenzaprine 10 MG tablet  Commonly known as:  FLEXERIL  Take 1 tablet (10 mg total) by mouth 3 (three) times daily as needed for muscle spasms.     dicyclomine 20 MG tablet  Commonly known as:  BENTYL  Take 1 tablet (20 mg total) by mouth 2 (two) times daily.     fluconazole 150 MG tablet  Commonly known as:  DIFLUCAN  Take 1 tablet (150 mg total) by mouth once.     furosemide 20 MG tablet  Commonly known as:  LASIX  Take 1 tablet (20 mg total) by mouth daily.     HYDROcodone-acetaminophen 5-325 MG per tablet    Commonly known as:  NORCO  Take 1-2 tablets by mouth every 6 (six) hours as needed for severe pain.     hydrocortisone 2.5 % rectal cream  Commonly known as:  ANUSOL-HC  Apply rectally 2 times daily     naproxen 500 MG tablet  Commonly known as:  NAPROSYN  Take 1 tablet (500 mg total) by mouth 2 (two) times daily as needed for mild pain, moderate pain or headache (TAKE WITH MEALS.).     pantoprazole 40 MG tablet  Commonly known as:  PROTONIX  Take 40 mg by mouth daily.     phenazopyridine 200 MG tablet  Commonly known as:  PYRIDIUM  Take 1 tablet (200 mg total) by mouth 3 (three) times daily with meals as needed for pain.     Vitamin D (Ergocalciferol) 50000 UNITS Caps capsule  Commonly known as:  DRISDOL  Take 1 capsule (50,000 Units total) by mouth every 7 (seven) days.        No orders of the defined types were placed in this encounter.    Immunization History  Administered Date(s) Administered  . Td 06/10/1990  Family History  Problem Relation Age of Onset  . Ovarian cancer Mother 26  . Heart failure Mother   . Depression Mother   . Cancer Mother     uterine cancer   . Heart failure Father 42    MI  . Diabetes Father     type 2  . Hypertension Father   . Heart disease Sister     History  Substance Use Topics  . Smoking status: Current Every Day Smoker -- 0.50 packs/day  . Smokeless tobacco: Not on file  . Alcohol Use: No    Review of Systems   As noted in HPI  Filed Vitals:   08/22/14 1441  BP: 110/76  Pulse: 87  Temp: 98.8 F (37.1 C)  Resp: 16    Physical Exam  Physical Exam  Constitutional: No distress.  Eyes: EOM are normal. Pupils are equal, round, and reactive to light.  Cardiovascular: Normal rate and regular rhythm.   Pulmonary/Chest: Breath sounds normal. No respiratory distress. She has no wheezes. She has no rales.  Musculoskeletal:  Trace ankle edema, 2+ dorsalis pedis pulse, ankle good range of motion    CBC     Component Value Date/Time   WBC 5.6 08/09/2014 1301   RBC 4.25 08/09/2014 1301   HGB 12.5 08/09/2014 1301   HCT 38.5 08/09/2014 1301   PLT 317 08/09/2014 1301   MCV 90.6 08/09/2014 1301   LYMPHSABS 2.5 08/09/2014 1301   MONOABS 0.4 08/09/2014 1301   EOSABS 0.2 08/09/2014 1301   BASOSABS 0.1 08/09/2014 1301    CMP     Component Value Date/Time   NA 142 08/09/2014 1301   K 4.9 08/09/2014 1301   CL 106 08/09/2014 1301   CO2 26 08/09/2014 1301   GLUCOSE 81 08/09/2014 1301   BUN 10 08/09/2014 1301   CREATININE 0.77 08/09/2014 1301   CREATININE 0.79 05/03/2014 1721   CALCIUM 9.0 08/09/2014 1301   PROT 6.1 08/09/2014 1301   ALBUMIN 3.7 08/09/2014 1301   AST 20 08/09/2014 1301   ALT 24 08/09/2014 1301   ALKPHOS 59 08/09/2014 1301   BILITOT 1.1 08/09/2014 1301   GFRNONAA 87 05/19/2014 1221   GFRNONAA >90 05/03/2014 1721   GFRAA >89 05/19/2014 1221   GFRAA >90 05/03/2014 1721    Lab Results  Component Value Date/Time   CHOL 190 05/19/2014 12:21 PM    No components found for: HGA1C  Lab Results  Component Value Date/Time   AST 20 08/09/2014 01:01 PM    Assessment and Plan  Pedal edema - Plan: currently improved advised patient for low salt diet leg elevation,  Vitamin D deficiency Continue with vitamin D supplement.  Morbid obesity Diet modification reinforced, patient to see a dietitian.  Pain in both feet - Plan: advise patient to wear comfortable shoes Ambulatory referral to Podiatry  SOB (shortness of breath) - Plan: will get baseline 2D Echocardiogram without contrast, DG Chest 2 View   Return if symptoms worsen or fail to improve.   This note has been created with Surveyor, quantity. Any transcriptional errors are unintentional.    Lorayne Marek, MD

## 2014-08-22 NOTE — Progress Notes (Signed)
Patient here for follow up from bilateral edema to lower extremities Her legs have gotten better and looking to return to work

## 2014-08-24 ENCOUNTER — Telehealth: Payer: Self-pay | Admitting: Internal Medicine

## 2014-08-24 ENCOUNTER — Other Ambulatory Visit: Payer: Self-pay | Admitting: Internal Medicine

## 2014-08-24 NOTE — Telephone Encounter (Signed)
Pt called requesting letter for employer, pt states employer needs letter by today . Please f/u with pt

## 2014-08-25 ENCOUNTER — Ambulatory Visit (HOSPITAL_COMMUNITY)
Admission: RE | Admit: 2014-08-25 | Discharge: 2014-08-25 | Disposition: A | Discharge status: Home / Self Care | Payer: Medicaid Other | Source: Ambulatory Visit | Attending: Internal Medicine | Admitting: Internal Medicine

## 2014-08-25 DIAGNOSIS — R0602 Shortness of breath: Secondary | ICD-10-CM

## 2014-08-25 DIAGNOSIS — R05 Cough: Secondary | ICD-10-CM | POA: Insufficient documentation

## 2014-08-25 DIAGNOSIS — R0600 Dyspnea, unspecified: Secondary | ICD-10-CM | POA: Insufficient documentation

## 2014-08-25 NOTE — Progress Notes (Signed)
  Echocardiogram 2D Echocardiogram has been performed.  Johny Chess 08/25/2014, 2:28 PM

## 2014-08-26 ENCOUNTER — Telehealth: Payer: Self-pay

## 2014-08-26 ENCOUNTER — Encounter: Payer: Medicaid Other | Attending: Internal Medicine | Admitting: Dietician

## 2014-08-26 ENCOUNTER — Encounter: Payer: Self-pay | Admitting: Nurse Practitioner

## 2014-08-26 VITALS — Ht 67.0 in | Wt 324.5 lb

## 2014-08-26 DIAGNOSIS — E669 Obesity, unspecified: Secondary | ICD-10-CM | POA: Diagnosis present

## 2014-08-26 DIAGNOSIS — Z713 Dietary counseling and surveillance: Secondary | ICD-10-CM | POA: Diagnosis not present

## 2014-08-26 DIAGNOSIS — Z6841 Body Mass Index (BMI) 40.0 and over, adult: Secondary | ICD-10-CM | POA: Insufficient documentation

## 2014-08-26 NOTE — Telephone Encounter (Signed)
-----   Message from Lorayne Marek, MD sent at 08/26/2014 12:39 PM EDT ----- Call and let the patient know that her echocardiogram is reported to be normal

## 2014-08-26 NOTE — Progress Notes (Signed)
Medical Nutrition Therapy:  Appt start time: 0815 end time:  0915.   Assessment:  Primary concerns today: obesity. Patient states she has struggled with her weight since she was 39 years old.  Over the last 6-8 months she has gained from 268 lbs up to 337 lbs, and since lost some of that weight weighing 325 lbs today.  She is unable to determine what has led to her recent gradual weight gain but she does have history of GI surgeries, IBS, and constipation.  She reports being very fatigued and has had swollen feet since Nov 2015.  She was going to the gym regularly but this has caused her to stop her exercise until she receives test results to make sure her heart is healthy enough for exercise.  She states her most comfortable weight was 220-230 lbs as an adult.   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   She gets about 6-8 hours of sleep at night but reports tossing and turning all night long.  She has tried many different weight strategies in the past including pharmacotherapy but always has regained the weight after stopping.  She has been considering bariatric surgery and doing research on that (her niece recently had vertical sleeve gastrectomy in [place] and has seen substantial weight loss) however, patient states she wants to try to lose weight on her own first.  She realizes the pros and cons of surgery and is concerned about the cost.  Patient has 3 children, two of which are of normal weight and one 41 year old who is 170 lbs who she is also very concerned about.  She states her daughter's dad is very thin.  They eat together as a family and often have 2 vegetables at each evening meal.  Patient is very knowledgeable to many nutrition topics but requests help getting back on track with both her eating habits and exercise (once approved by doctor).  Preferred Learning Style:  No preference  indicated   Learning Readiness:   Ready  MEDICATIONS: see list   DIETARY INTAKE:  Usual eating pattern includes 3 meals and 1 snacks per day.   24-hr recall:  B ( AM): premier protein shake  Snk (9 AM): 1/2 premier protein shake  L ( PM): other 1/2 of shake and leftovers from home or go out to eat Snk ( PM): not usually D (6-8 PM): [place] necks, green beans, corn, rice (often have asparagus or brussel sprouts) Snk ( PM): none Beverages: water, 1 mtn dew at work, apple or cranberry grape juice occasionally   Usual physical activity: was walking in the morning for 4-5 miles and going to the gym up until 4 months ago with rectal bleeding and swollen feet.  Due to this no current exercise.  Patient is very motivated to begin physical activity again in her routine but is nervous due to medical concerns.  Estimated energy needs: 1500 calories  Progress Towards Goal(s):  In progress.   Nutritional Diagnosis:  [place]ittrell-3.3 Overweight/obesity As related to GI conditions and lack of physical activity including sedentary job.  As evidenced by patient history and BMI of 51..    Intervention:  Nutrition education and counseling. Discussed strategies for weight loss and recommended choosing those that will work with her routine, schedule, family, etc. for the long-term.  Patient requested a meal plan guideline  she can use to help plan her meals which has worked well for her in the past.  Provided sample meal plan for 1,500 calories/day and taught her how to use supplemental handout to choose substitutions for foods she doesn't like and to increase variety.  Agreed with patient she wants to start low and work up slowly with her exercise following approval from her doctor.  Recommended she work with a medically trained personal trainer who can take her health concerns into consideration and ease her fear of hurting herself with exercise.  Recommend she also choose activities that she enjoys and to pick a  variety of activities so that she does not get bored with one work out regimen.  Stated that we offer a Child Healthy Living class here at [facility] if she would like to sign up for her and her daughter to attend.  Praised her for making her health a priority and encouraged her to be positive through this process.  Answered her questions about bariatric surgery and stated importance of follow-up with medical professionals, taking vitamin and mineral supplements, etc. if she does decide to take this route in the future.  Discussed use of probiotics (she has taken in the past) for GI health and increasing intake of prebiotics (like asparagus, dark leafy greens, high fiber foods).  Recommended she also consider stress management and improving restful sleep habits to also assist in her weight loss goals.  Discussed realistic weight loss goals and an appropriate rate of weight loss as 1-2 lbs a week.  Stated that water retention will alter what progress she sees on the scale, so recommend using other measurements such as energy level, how clothes fit, GI regularity, etc. as outcomes for her progress.  Teaching Method Utilized: Visual Auditory  Handouts given during visit include:  Nutrition Strategies for Weight Loss  1500 Calorie 5-Day Meal Plan from [employer]  Yellow Card Meal Planner  MyPlate portion plate  Barriers to learning/adherence to lifestyle change: medical considerations  Demonstrated degree of understanding via:  Teach Back   Monitoring/Evaluation:  Dietary intake, exercise, labs, and body weight in 3 month(s).

## 2014-08-26 NOTE — Telephone Encounter (Signed)
Patient is aware of her echo results and x ray results

## 2014-09-01 ENCOUNTER — Telehealth: Payer: Self-pay | Admitting: Emergency Medicine

## 2014-09-01 NOTE — Telephone Encounter (Signed)
[name] the patients phone was disconnected when I tried to call the patient. If you want I will call the work number. The paperwork is in the [place].

## 2014-09-09 ENCOUNTER — Encounter: Payer: Self-pay | Admitting: Podiatry

## 2014-09-09 ENCOUNTER — Ambulatory Visit (INDEPENDENT_AMBULATORY_CARE_PROVIDER_SITE_OTHER): Payer: Medicaid Other

## 2014-09-09 ENCOUNTER — Telehealth: Payer: Self-pay | Admitting: Emergency Medicine

## 2014-09-09 ENCOUNTER — Ambulatory Visit (INDEPENDENT_AMBULATORY_CARE_PROVIDER_SITE_OTHER): Payer: Medicaid Other | Admitting: Podiatry

## 2014-09-09 VITALS — BP 114/69 | HR 82 | Resp 12

## 2014-09-09 DIAGNOSIS — M7750 Other enthesopathy of unspecified foot: Secondary | ICD-10-CM | POA: Diagnosis not present

## 2014-09-09 DIAGNOSIS — R52 Pain, unspecified: Secondary | ICD-10-CM

## 2014-09-09 DIAGNOSIS — M779 Enthesopathy, unspecified: Secondary | ICD-10-CM

## 2014-09-09 DIAGNOSIS — M76829 Posterior tibial tendinitis, unspecified leg: Secondary | ICD-10-CM

## 2014-09-09 DIAGNOSIS — M7751 Other enthesopathy of right foot: Secondary | ICD-10-CM | POA: Diagnosis not present

## 2014-09-09 DIAGNOSIS — M7752 Other enthesopathy of left foot: Secondary | ICD-10-CM

## 2014-09-09 NOTE — Patient Instructions (Signed)
Posterior Tibial Tendon Tendinitis with Rehab Tendonitis is a condition that is characterized by inflammation of a tendon or the lining (sheath) that surrounds it. The inflammation is usually caused by damage to the tendon, such as a tendon tear (strain). Sprains are classified into three categories. Grade 1 sprains cause pain, but the tendon is not lengthened. Grade 2 sprains include a lengthened ligament due to the ligament being stretched or partially ruptured. With grade 2 sprains there is still function, although the function may be diminished. Grade 3 sprains are characterized by a complete tear of the tendon or muscle, and function is usually impaired. Posterior tibialis tendonitis is tendonitis of the posterior tibial tendon, which attaches muscles of the lower leg to the foot. The posterior tibial tendon is located in the back of the ankle and helps the body straighten (plantar flex) and rotate inward (medially rotate) the ankle. SYMPTOMS   Pain, tenderness, swelling, warmth, and/or redness over the back of the inner ankle at the posterior tibial tendon or the inner part of the mid-foot.  Pain that worsens with plantar flexion or medial rotation of the ankle.  A crackling sound (crepitation) when the tendon is moved or touched. CAUSES  Posterior tibial tendonitis occurs when damage to the posterior tibial tendon starts an inflammatory response. Common mechanisms of injury include:  Degenerative (occurs with aging) processes that weaken the tendon and make it more susceptible to injury.  Stress placed on the tendon from an increase in the intensity, frequency, or duration of training.  Direct trauma to the ankle.  Returning to activity before a previous ankle injury is allowed to heal. RISK INCREASES WITH:  Activities that involve repetitive and/or stressful plantar flexion (jumping, kicking, or running up/down hills).  Poor strength and flexibility.  Flat feet.  Previous injury  to the foot, ankle, or leg. PREVENTION   Warm up and stretch properly before activity.  Allow for adequate recovery between workouts.  Maintain physical fitness:  Strength, flexibility, and endurance.  Cardiovascular fitness.  Learn and use proper technique. When possible, have a coach correct improper technique.  Complete rehabilitation from a previous foot, ankle, or leg injury.  If you have flat feet, wear arch supports (orthotics). PROGNOSIS  If treated properly, the symptoms of tendonitis usually resolve within 6 weeks. This period may be shorter for injuries caused by direct trauma. RELATED COMPLICATIONS   Prolonged healing time, if improperly treated or reinjured.  Recurrent symptoms that result in a chronic problem.  Partial or complete tendon tear (rupture) requiring surgery. TREATMENT  Treatment initially involves the use of ice and medication to help reduce pain and inflammation. The use of strengthening and stretching exercises may help reduce pain with activity. These exercises may be performed at home or with referral to a therapist. Often times, your caregiver will recommend immobilizing the ankle to allow the tendon to heal. If you have flat feet, you may be advised to wear orthotic arch supports. If symptoms persist for greater than 6 months despite nonsurgical (conservative) treatment, then surgery may be recommended. MEDICATION   If pain medication is necessary, then nonsteroidal anti-inflammatory medications, such as aspirin and ibuprofen, or other minor pain relievers, such as acetaminophen, are often recommended.  Do not take pain medication for 7 days before surgery.  Prescription pain relievers may be given if deemed necessary by your caregiver. Use only as directed and only as much as you need.  Corticosteroid injections may be given by your caregiver. These injections should  be reserved for the most serious cases because they may only be given a certain  number of times. HEAT AND COLD  Cold treatment (icing) relieves pain and reduces inflammation. Cold treatment should be applied for 10 to 15 minutes every 2 to 3 hours for inflammation and pain and immediately after any activity that aggravates your symptoms. Use ice packs or massage the area with a piece of ice (ice massage).  Heat treatment may be used prior to performing the stretching and strengthening activities prescribed by your caregiver, physical therapist, or athletic trainer. Use a heat pack or soak the injury in warm water. SEEK MEDICAL CARE IF:  Treatment seems to offer no benefit, or the condition worsens.  Any medications produce adverse side effects. EXERCISES RANGE OF MOTION (ROM) AND STRETCHING EXERCISES - Posterior Tibial Tendon Tendinitis These exercises may help you when beginning to rehabilitate your injury. Your symptoms may resolve with or without further involvement from your physician, physical therapist or athletic trainer. While completing these exercises, remember:   Restoring tissue flexibility helps normal motion to return to the joints. This allows healthier, less painful movement and activity.  An effective stretch should be held for at least 30 seconds.  A stretch should never be painful. You should only feel a gentle lengthening or release in the stretched tissue. RANGE OF MOTION - Ankle Plantar Flexion   Sit with your right / left leg crossed over your opposite knee.  Use your opposite hand to pull the top of your foot and toes toward you.  You should feel a gentle stretch on the top of your foot/ankle. Hold this position for __________ seconds. Repeat __________ times. Complete this exercise __________ times per day.  RANGE OF MOTION - Ankle Eversion   Sit with your right / left ankle crossed over your opposite knee.  Grip your foot with your opposite hand, placing your thumb on the top of your foot and your fingers across the bottom of your  foot.  Gently push your foot downward with a slight rotation so your littlest toes rise slightly.  You should feel a gentle stretch on the inside of your ankle. Hold the stretch for __________ seconds. Repeat __________ times. Complete this exercise __________ times per day.  RANGE OF MOTION - Ankle Inversion   Sit with your right / left ankle crossed over your opposite knee.  Grip your foot with your opposite hand, placing your thumb on the bottom of your foot and your fingers across the top of your foot.  Gently pull your foot so the smallest toe comes toward you and your thumb pushes the inside of the ball of your foot away from you.  You should feel a gentle stretch on the outside of your ankle. Hold the stretch for __________ seconds. Repeat __________ times. Complete this exercise __________ times per day.  RANGE OF MOTION - Dorsi/Plantar Flexion  While sitting with your right / left knee straight, draw the top of your foot upward by flexing your ankle. Then reverse the motion, pointing your toes downward.  Hold each position for __________ seconds.  After completing your first set of exercises, repeat this exercise with your knee bent. Repeat __________ times. Complete this exercise __________ times per day.  RANGE OF MOTION - Ankle Alphabet  Imagine your right / left big toe is a pen.  Keeping your hip and knee still, write out the entire alphabet with your "pen." Make the letters as large as you can without   increasing any discomfort. Repeat __________ times. Complete this exercise __________ times per day.  STRETCH - Gastrocsoleus   Sit with your right / left leg extended. Holding onto both ends of a belt or towel, loop it around the ball of your foot.  Keeping your right / left ankle and foot relaxed and your knee straight, pull your foot and ankle toward you using the belt/towel.  You should feel a gentle stretch behind your calf or knee. Hold this position for  __________ seconds. Repeat __________ times. Complete this exercise __________ times per day.  STRETCH - Gastroc, Standing   Place hands on wall.  Extend right / left leg, keeping the front knee somewhat bent.  Slightly point your toes inward on your back foot.  Keeping your right / left heel on the floor and your knee straight, shift your weight toward the wall, not allowing your back to arch.  You should feel a gentle stretch in the right / left calf. Hold this position for __________ seconds. Repeat __________ times. Complete this stretch __________ times per day. STRETCH - Soleus, Standing   Place hands on wall.  Extend right / left leg, keeping the other knee somewhat bent.  Slightly point your toes inward on your back foot.  Keep your right / left heel on the floor, bend your back knee, and slightly shift your weight over the back leg so that you feel a gentle stretch deep in your back calf.  Hold this position for __________ seconds. Repeat __________ times. Complete this stretch __________ times per day. STRENGTHENING EXERCISES - Posterior Tibial Tendon Tendinitis These exercises may help you when beginning to rehabilitate your injury. They may resolve your symptoms with or without further involvement from your physician, physical therapist, or athletic trainer. While completing these exercises, remember:   Muscles can gain both the endurance and the strength needed for everyday activities through controlled exercises.  Complete these exercises as instructed by your physician, physical therapist, or athletic trainer. Progress the resistance and repetitions only as guided. STRENGTH - Dorsiflexors  Secure a rubber exercise band/tubing to a fixed object (i.e., table, pole) and loop the other end around your right / left foot.  Sit on the floor facing the fixed object. The band/tubing should be slightly tense when your foot is relaxed.  Slowly draw your foot back toward you  using your ankle and toes.  Hold this position for __________ seconds. Slowly release the tension in the band and return your foot to the starting position. Repeat __________ times. Complete this exercise __________ times per day.  STRENGTH - Towel Curls  Sit in a chair positioned on a non-carpeted surface.  Place your foot on a towel, keeping your heel on the floor.  Pull the towel toward your heel by only curling your toes. Keep your heel on the floor.  If instructed by your physician, physical therapist, or athletic trainer, add ____________________ at the end of the towel. Repeat __________ times. Complete this exercise __________ times per day. STRENGTH - Ankle Eversion   Secure one end of a rubber exercise band/tubing to a fixed object (table, pole). Loop the other end around your foot just before your toes.  Place your fists between your knees. This will focus your strengthening at your ankle.  Drawing the band/tubing across your opposite foot, slowly pull your little toe out and up. Make sure the band/tubing is positioned to resist the entire motion.  Hold this position for __________ seconds.  Have   your muscles resist the band/tubing as it slowly pulls your foot back to the starting position. Repeat __________ times. Complete this exercise __________ times per day.  STRENGTH - Ankle Inversion   Secure one end of a rubber exercise band/tubing to a fixed object (table, pole). Loop the other end around your foot just before your toes.  Place your fists between your knees. This will focus your strengthening at your ankle.  Slowly, pull your big toe up and in, making sure the band/tubing is positioned to resist the entire motion.  Hold this position for __________ seconds.  Have your muscles resist the band/tubing as it slowly pulls your foot back to the starting position. Repeat __________ times. Complete this exercises __________ times per day.  Document Released:  05/27/2005 Document Revised: 10/11/2013 Document Reviewed: 09/08/2008 [facility] Patient Information 2015 [place], [place]. This information is not intended to replace advice given to you by your health care provider. Make sure you discuss any questions you have with your health care provider.

## 2014-09-09 NOTE — Progress Notes (Signed)
   Subjective:    Patient ID: [name], female    DOB: Feb 14, 1976, 39 y.o.   MRN: [id number]  HPI -year-old female since the office today with complaints of bilateral foot pain and swelling with the left side greater than the right. She states that she has pain into her ankle. She states that she's had developed swelling and ankle in January. She denies any history of injury or trauma to the area. Denies any surrounding erythema or increase in warmth. She said that she has pain over the area after prolonged ambulation or weightbearing. She's been soaking the foot in warm water as well as icing the area without much relief. No other complaints at this time.    Review of Systems  Musculoskeletal: Positive for myalgias, back pain and joint swelling.  All other systems reviewed and are negative.      Objective:   Physical Exam AAO 3, NAD DP/PT pulses palpable, CRT less than 3 seconds Protective sensation intact with Simms Weinstein monofilament, vibratory sensation intact, Achilles tendon reflex intact. Negative Tinel sign. Visit decrease in medial arch height upon weightbearing present there is tenderness overlying the course of the posterior tibial tendon posterior inferior to the medial malleolus and along the insertion into the navicular tuberosity. There is equinus present bilaterally. There is no pinpoint bony tenderness or pain with vibratory sensation of bilateral lower x-rays. The patient is able to perform a double heel rise however single heel rise limited. There is mild edema overlying the medial aspect of the ankle and the left side. There is no associated erythema or increase in warmth. No other areas of tenderness to bilateral lower extremities. MMT 5/5, ROM WNL No open lesions or pre-ulcerative lesions identified bilaterally. No pain with calf compression, swelling, warmth, erythema.     Assessment & Plan:  39 year old female with posterior tibial tendonitis left greater  than right -X-rays were obtained and reviewed with the patient. -Treatment options were discussed the patient could alternatives, risks, complications. -At this time due to the discomfort on the course of posterior tibial tendon recommended a Tri-Lock ankle brace for stabilization. Also discussed icing. Discussed rehabilitation exercises which were given to her as well. Anti-inflammatories as needed. -Follow-up in 4 weeks or sooner if any problems are to arise. In the meantime encouraged to call the office with any questions, concerns, change in symptoms.

## 2014-09-09 NOTE — Telephone Encounter (Signed)
Patient called and wanted to know the status of her Medical Record Release, she needs this for back pay/ faxed to [facility]

## 2014-09-09 NOTE — Telephone Encounter (Signed)
I called the patient back and told her that she needs to come by the office today and sign a release authorization. Patient stated that she would be here at 3, Asencion Partridge agreed to release the records today, patient was very grateful.

## 2014-09-21 ENCOUNTER — Emergency Department (INDEPENDENT_AMBULATORY_CARE_PROVIDER_SITE_OTHER)
Admission: EM | Admit: 2014-09-21 | Discharge: 2014-09-21 | Disposition: A | Discharge status: Home / Self Care | Payer: Medicaid Other | Source: Home / Self Care | Attending: Emergency Medicine | Admitting: Emergency Medicine

## 2014-09-21 ENCOUNTER — Encounter (HOSPITAL_COMMUNITY): Payer: Self-pay | Admitting: Emergency Medicine

## 2014-09-21 ENCOUNTER — Telehealth: Payer: Self-pay | Admitting: Internal Medicine

## 2014-09-21 DIAGNOSIS — R197 Diarrhea, unspecified: Secondary | ICD-10-CM | POA: Diagnosis not present

## 2014-09-21 DIAGNOSIS — R109 Unspecified abdominal pain: Secondary | ICD-10-CM | POA: Diagnosis not present

## 2014-09-21 MED ORDER — TRAMADOL HCL 50 MG PO TABS: 50.0000 mg | ORAL_TABLET | Freq: Two times a day (BID) | ORAL | Status: DC | PRN

## 2014-09-21 NOTE — ED Provider Notes (Signed)
[name]: [id number]     Arrival date & time 09/21/14  4481 History   First MD Initiated Contact with Patient 09/21/14 [phone]     Chief Complaint  Patient presents with  . Abdominal Pain   (Consider location/radiation/quality/duration/timing/severity/associated sxs/prior Treatment) HPI  She is a 39 year old woman here for evaluation of diarrhea and abdominal pain. She states her symptoms started on Sunday with lower abdominal cramping and watery diarrhea. She states she was initially concerned about constipation with overflow diarrhea so increased her fluid intake. On Monday, she reports diarrhea with some solid chunks in it. Since then, she has continued to have loose to watery stools. She denies any nausea or vomiting. No fevers area and she does report fatigue. She is drinking plenty of fluids. She has seen some streaky blood in her stool. She had a colonoscopy done the beginning of March and had 4 polyps removed. She was also found to have internal hemorrhoids. She has an appointment with her GI doctor on Friday.  Her granddaughter has had diarrhea recently.  Past Medical History  Diagnosis Date  . Obesity   . Migraine    Past Surgical History  Procedure Laterality Date  . Cesarean section      x 4  . Umbilical hernia repair  2007  . Tubal ligation Bilateral 2006  . Colposcopy     Family History  Problem Relation Age of Onset  . Ovarian cancer Mother 53  . Heart failure Mother   . Depression Mother   . Cancer Mother     uterine cancer   . Heart failure Father 71    MI  . Diabetes Father     type 2  . Hypertension Father   . Heart disease Sister    History  Substance Use Topics  . Smoking status: Current Every Day Smoker -- 0.50 packs/day  . Smokeless tobacco: Not on file  . Alcohol Use: No   OB History    Gravida Para Term Preterm AB TAB SAB Ectopic Multiple Living   6 4 4  2 1 1   4      Review of Systems  Constitutional: Positive for activity change, appetite change  and fatigue. Negative for fever and chills.  Gastrointestinal: Positive for abdominal pain (lower abdominal cramping), diarrhea and blood in stool. Negative for nausea, vomiting and constipation.    Allergies  Contrast media and Morphine and related  Home Medications   Prior to Admission medications   Medication Sig Start Date End Date Taking? Authorizing Provider  cyclobenzaprine (FLEXERIL) 10 MG tablet Take 1 tablet (10 mg total) by mouth 3 (three) times daily as needed for muscle spasms. 04/12/14   Mercedes Camprubi-Soms, PA-C  dicyclomine (BENTYL) 20 MG tablet Take 1 tablet (20 mg total) by mouth 2 (two) times daily. 05/03/14   Jennifer Piepenbrink, PA-C  fluconazole (DIFLUCAN) 150 MG tablet Take 1 tablet (150 mg total) by mouth once. 06/01/14   Lorayne Marek, MD  furosemide (LASIX) 20 MG tablet Take 1 tablet (20 mg total) by mouth daily. 07/27/14   Lorayne Marek, MD  hydrocortisone (ANUSOL-HC) 2.5 % rectal cream Apply rectally 2 times daily 05/03/14   Baron Sane, PA-C  naproxen (NAPROSYN) 500 MG tablet Take 1 tablet (500 mg total) by mouth 2 (two) times daily as needed for mild pain, moderate pain or headache (TAKE WITH MEALS.). 04/12/14   Mercedes Camprubi-Soms, PA-C  pantoprazole (PROTONIX) 40 MG tablet Take 40 mg by mouth daily.    Historical Provider,  MD  phenazopyridine (PYRIDIUM) 200 MG tablet Take 1 tablet (200 mg total) by mouth 3 (three) times daily with meals as needed for pain. 04/12/14   Mercedes Camprubi-Soms, PA-C  traMADol (ULTRAM) 50 MG tablet Take 1 tablet (50 mg total) by mouth every 12 (twelve) hours as needed for severe pain. 09/21/14   Melony Overly, MD  Vitamin D, Ergocalciferol, (DRISDOL) 50000 UNITS CAPS capsule Take 1 capsule (50,000 Units total) by mouth every 7 (seven) days. 05/20/14   Lorayne Marek, MD   BP 122/86 mmHg  Pulse 77  Temp(Src) 99.4 F (37.4 C) (Oral)  Resp 20  SpO2 100%  LMP 09/03/2014 (Exact Date) Physical Exam  Constitutional: She is  oriented to person, place, and time. She appears well-developed and well-nourished. No distress.  Neck: Neck supple.  Cardiovascular: Normal rate, regular rhythm and normal heart sounds.   No murmur heard. Pulmonary/Chest: Effort normal and breath sounds normal. No respiratory distress. She has no wheezes. She has no rales.  Abdominal: Bowel sounds are normal. She exhibits no distension. There is tenderness (Mild tenderness in lower quadrants). There is no rebound and no guarding.  Genitourinary: Rectal exam shows no external hemorrhoid and no fissure.  Neurological: She is alert and oriented to person, place, and time.    ED Course  Procedures (including critical care time) Labs Review Labs Reviewed - No data to display  Imaging Review No results found.   MDM   1. Diarrhea   2. Abdominal cramps    Abdominal exam is benign.  Suspect a viral gastroenteritis. Recommended continued increased fluid intake. Tylenol 3 times a day as needed for pain. Prescription for tramadol provided to use sparingly for severe pain. Follow-up with GI doctor as scheduled on Friday.    Melony Overly, MD 09/21/14 1022

## 2014-09-21 NOTE — Discharge Instructions (Signed)
You likely have a stomach bug causing your cramping and diarrhea. Make sure you are taking plenty of fluids. Take Tylenol 3 times a day as needed for discomfort. Use the tramadol sparingly for severe pain as this can cause constipation. Please follow-up with your GI doctor as scheduled on Friday.

## 2014-09-21 NOTE — Telephone Encounter (Signed)
Patient is calling to request a letter for her insurance [employer] indicating the reason she was out for work, she stated that she was out of work due to her swollen feet and fluid retention. The letter needs to be faxed to [phone] Case Number [id number]. Please f/u with patient for more information.

## 2014-09-21 NOTE — ED Notes (Signed)
Pt had a colonoscopy with polyp removal and report of internal hemorrhoids on March 3.  Pt was doing well, but on Sunday started developing BLQ cramping and watery stools with some blood.    Pt does have a f/u with her GI specialist on Friday.

## 2014-09-25 DIAGNOSIS — R0600 Dyspnea, unspecified: Secondary | ICD-10-CM

## 2014-10-10 ENCOUNTER — Telehealth: Payer: Self-pay | Admitting: Internal Medicine

## 2014-10-10 NOTE — Telephone Encounter (Signed)
Patient's Disability management form has been signed and faxed;

## 2014-10-14 ENCOUNTER — Ambulatory Visit: Payer: Medicaid Other | Attending: Family Medicine | Admitting: Family Medicine

## 2014-10-14 ENCOUNTER — Encounter: Payer: Self-pay | Admitting: Family Medicine

## 2014-10-14 ENCOUNTER — Ambulatory Visit: Payer: Medicaid Other | Admitting: Podiatry

## 2014-10-14 VITALS — BP 132/83 | HR 73 | Temp 98.7°F | Resp 18 | Ht 67.0 in | Wt 322.0 lb

## 2014-10-14 DIAGNOSIS — Z6841 Body Mass Index (BMI) 40.0 and over, adult: Secondary | ICD-10-CM | POA: Diagnosis not present

## 2014-10-14 DIAGNOSIS — M545 Low back pain: Secondary | ICD-10-CM | POA: Insufficient documentation

## 2014-10-14 DIAGNOSIS — G8929 Other chronic pain: Secondary | ICD-10-CM | POA: Insufficient documentation

## 2014-10-14 DIAGNOSIS — R600 Localized edema: Secondary | ICD-10-CM | POA: Diagnosis not present

## 2014-10-14 DIAGNOSIS — R7989 Other specified abnormal findings of blood chemistry: Secondary | ICD-10-CM | POA: Diagnosis not present

## 2014-10-14 DIAGNOSIS — J449 Chronic obstructive pulmonary disease, unspecified: Secondary | ICD-10-CM | POA: Insufficient documentation

## 2014-10-14 DIAGNOSIS — R609 Edema, unspecified: Secondary | ICD-10-CM | POA: Diagnosis not present

## 2014-10-14 DIAGNOSIS — R5383 Other fatigue: Secondary | ICD-10-CM | POA: Insufficient documentation

## 2014-10-14 DIAGNOSIS — R0602 Shortness of breath: Secondary | ICD-10-CM | POA: Insufficient documentation

## 2014-10-14 DIAGNOSIS — Z720 Tobacco use: Secondary | ICD-10-CM | POA: Diagnosis not present

## 2014-10-14 DIAGNOSIS — F17200 Nicotine dependence, unspecified, uncomplicated: Secondary | ICD-10-CM

## 2014-10-14 DIAGNOSIS — R791 Abnormal coagulation profile: Secondary | ICD-10-CM

## 2014-10-14 LAB — CBC
HCT: 39.9 % (ref 36.0–46.0)
HEMOGLOBIN: 13.2 g/dL (ref 12.0–15.0)
MCH: 30 pg (ref 26.0–34.0)
MCHC: 33.1 g/dL (ref 30.0–36.0)
MCV: 90.7 fL (ref 78.0–100.0)
MPV: 10.4 fL (ref 8.6–12.4)
Platelets: 294 10*3/uL (ref 150–400)
RBC: 4.4 MIL/uL (ref 3.87–5.11)
RDW: 13.5 % (ref 11.5–15.5)
WBC: 6.7 10*3/uL (ref 4.0–10.5)

## 2014-10-14 MED ORDER — BUPROPION HCL ER (SR) 150 MG PO TB12: 150.0000 mg | ORAL_TABLET | Freq: Two times a day (BID) | ORAL | Status: DC

## 2014-10-14 NOTE — Progress Notes (Signed)
Patient changing PCP-to establish with Dr. Adrian Blackwater. Patient concerned about weight gain. She indicates she has gained >50 lbs since 6/15. S/p colonoscopy 3/16 and polyp removal x4 per patient. Patient also has bilateral ankle edema. Left>right.  Patient was unable to work for 4 weeks due to ankle swelling. Patient indicates short term disability paperwork in office. PHQ-9 score-12

## 2014-10-14 NOTE — Assessment & Plan Note (Addendum)
Smoking:  Smoking cessation support: smoking cessation hotline: 1-800-QUIT-NOW.  Smoking cessation classes are available through [facility] and [facility]. Call [phone] or visit our website at [email or web address] Start wellbutrin 150 mg daily for 3 days, then twice daily

## 2014-10-14 NOTE — Assessment & Plan Note (Signed)
Shortness of breath: With weight gain over the last 6 months, smoker, ddx deconditioning, COPD  Checking CBC, D dimer You had normal CXR, ECHO, no congestive heart failure Weight loss is a must Smoking cessation is a must May try albuterol inhaler

## 2014-10-14 NOTE — Assessment & Plan Note (Signed)
Foot edema: Ruling out anemia Carrying extra weight will cause edema  Plan: Continue low salt diet Elevates legs when resting  Wear compression stockings, putting them on in the morning

## 2014-10-14 NOTE — Patient Instructions (Signed)
[name],  Thank you for coming in today  1. Shortness of breath: With weight gain over the last 6 months, smoker  Checking CBC, D dimer You had normal CXR, ECHO, no congestive heart failure  Weight loss is a must  Using a diabetic meal plan as a guide is a good way to get diet ideas  2. Smoking:  Smoking cessation support: smoking cessation hotline: 1-800-QUIT-NOW.  Smoking cessation classes are available through [facility] and [facility]. Call [phone] or visit our website at [email or web address] Start wellbutrin 150 mg daily for 3 days, then twice daily   3. Foot edema: Ruling out anemia Carrying extra weight will cause edema  Plan: Continue low salt diet Elevates legs when resting  Wear compression stockings, putting them on in the morning    F/u in 6 weeks with me   Dr. Adrian Blackwater

## 2014-10-14 NOTE — Progress Notes (Signed)
   Subjective:    Patient ID: [name], female    DOB: Sep 04, 1975, 39 y.o.   MRN: [id number] CC: weight gain, leg swelling,  HPI  1. Morbid obesity: obese all of her life. Gained 60 # in last year. No SOB with moderate exertion. Has chronic low back pain. Has leg swelling with foot pain. Distressed about her weight.   2. Edema and SOB: w/u for CHF negative. Patient is a smoker was 1/2 PPD now 2 PPD. Desires to quit smoking.   3. Paperwork: requesting short-term disability paperwork she out from  Feb 21-Mar 21. Initially  due to swelling, then developed GI bleed. Had colonoscopy 08/11/14, had 4 polyps removed. Some bleeding after polypectomy as well. She is back at work, but did not get compensated for the time she was off.   Soc Hx: smoking 1/2 PPD before now smoking 2 PPD Review of Systems  Constitutional: Negative.   Respiratory: Positive for shortness of breath. Negative for apnea, cough, choking, chest tightness, wheezing and stridor.   Cardiovascular: Positive for leg swelling. Negative for chest pain and palpitations.  Gastrointestinal: Negative.   Endocrine: Negative.   Hematological: Negative.   Psychiatric/Behavioral: Positive for dysphoric mood. Negative for suicidal ideas.       Objective:   Physical Exam BP 132/83 mmHg  Pulse 73  Temp(Src) 98.7 F (37.1 C) (Oral)  Resp 18  Ht 5\' 7"  (1.702 m)  Wt 322 lb (146.058 kg)  BMI 50.42 kg/m2  SpO2 99%  LMP 09/20/2014  Wt Readings from Last 3 Encounters:  10/14/14 322 lb (146.058 kg)  08/26/14 324 lb 8 oz (147.192 kg)  08/22/14 330 lb (149.687 kg)   General appearance: alert, cooperative, no distress and morbidly obese Lungs: clear to auscultation bilaterally Heart: regular rate and rhythm, S1, S2 normal, no murmur, click, rub or gallop Extremities: extremities normal, atraumatic, no cyanosis or edema  Skin: hypopigmented papules on legs. Hyperpigmented macules on plantar foot   Lab Results  Component Value Date     HGBA1C 5.2 05/19/2014   TSH 1.304  CXR 08/24/13: wnl  ECHO 08/24/13: wnl      Assessment & Plan:

## 2014-10-14 NOTE — Assessment & Plan Note (Signed)
Weight loss is a must  Using a diabetic meal plan as a guide is a good way to get diet ideas

## 2014-10-15 LAB — D-DIMER, QUANTITATIVE (NOT AT ARMC): D DIMER QUANT: 0.55 ug{FEU}/mL — AB (ref 0.00–0.48)

## 2014-10-17 DIAGNOSIS — R7989 Other specified abnormal findings of blood chemistry: Secondary | ICD-10-CM | POA: Insufficient documentation

## 2014-10-17 MED ORDER — ASPIRIN EC 325 MG PO TBEC: 325.0000 mg | DELAYED_RELEASE_TABLET | Freq: Every day | ORAL | Status: DC

## 2014-10-17 NOTE — Assessment & Plan Note (Signed)
Slightly elevated D-dimer in setting for b/l leg swelling, SOB, morbid obesity   Will need to assess for blood clots Starting with LE doppler (needs to be scheduled) if negative for clots, then CT angiogram vs VQ scan ( patient has allergy to contrast media)  Patient to start taking ASA 325 mg once daily while awaiting results

## 2014-10-17 NOTE — Addendum Note (Signed)
Addended by: Boykin Nearing on: 10/17/2014 09:23 AM   Modules accepted: Orders

## 2014-10-19 ENCOUNTER — Telehealth: Payer: Self-pay | Admitting: Family Medicine

## 2014-10-19 ENCOUNTER — Telehealth: Payer: Self-pay | Admitting: *Deleted

## 2014-10-19 DIAGNOSIS — M7989 Other specified soft tissue disorders: Secondary | ICD-10-CM

## 2014-10-19 NOTE — Telephone Encounter (Signed)
Patient is returning phone call from RMA, please f/u with pt.

## 2014-10-19 NOTE — Telephone Encounter (Signed)
-----   Message from Boykin Nearing, MD sent at 10/17/2014  9:20 AM EDT ----- Patient w/o anemia. Slightly elevated D-dimer Will need to assess for blood clots Starting with LE doppler (needs to be scheduled)  if negative for clots, then CT angiogram, Patient to start taking ASA 325 mg once daily while awaiting results

## 2014-10-19 NOTE — Telephone Encounter (Signed)
LVM to return call.

## 2014-10-20 ENCOUNTER — Encounter: Payer: Self-pay | Admitting: *Deleted

## 2014-10-20 NOTE — Progress Notes (Signed)
Patient ID: [name], female   DOB: [birth year], 39 y.o.   MRN: [id number]   PA for LE doppler is [id number]

## 2014-10-21 ENCOUNTER — Telehealth: Payer: Self-pay | Admitting: Family Medicine

## 2014-10-21 ENCOUNTER — Ambulatory Visit: Payer: Self-pay | Admitting: Family Medicine

## 2014-10-21 NOTE — Telephone Encounter (Signed)
Patient is returning phone call from nurse, patient is also calling to check on the status of the paperwork her insurance faxed over.Please f/u with pt.

## 2014-10-24 ENCOUNTER — Other Ambulatory Visit (HOSPITAL_COMMUNITY): Payer: Self-pay | Admitting: Family Medicine

## 2014-10-24 DIAGNOSIS — M7989 Other specified soft tissue disorders: Secondary | ICD-10-CM

## 2014-10-24 NOTE — Telephone Encounter (Signed)
Pt aware of results 

## 2014-10-24 NOTE — Telephone Encounter (Signed)
Doppler appointment on Oct 27 2014 at 9:00am at [facility] LVM to return call

## 2014-10-26 ENCOUNTER — Encounter: Payer: Self-pay | Admitting: Nurse Practitioner

## 2014-10-27 ENCOUNTER — Encounter: Payer: Self-pay | Admitting: *Deleted

## 2014-10-27 ENCOUNTER — Ambulatory Visit (HOSPITAL_COMMUNITY)
Admission: RE | Admit: 2014-10-27 | Discharge: 2014-10-27 | Disposition: A | Discharge status: Home / Self Care | Payer: Medicaid Other | Source: Ambulatory Visit | Attending: Family Medicine | Admitting: Family Medicine

## 2014-10-27 ENCOUNTER — Telehealth: Payer: Self-pay | Admitting: Family Medicine

## 2014-10-27 DIAGNOSIS — M7989 Other specified soft tissue disorders: Secondary | ICD-10-CM

## 2014-10-27 DIAGNOSIS — R7989 Other specified abnormal findings of blood chemistry: Secondary | ICD-10-CM | POA: Insufficient documentation

## 2014-10-27 DIAGNOSIS — R590 Localized enlarged lymph nodes: Secondary | ICD-10-CM | POA: Diagnosis not present

## 2014-10-27 NOTE — Progress Notes (Signed)
Patient ID: [name], female   DOB: Mar 31, 1976, 39 y.o.   MRN: [id number]  Katharine Look from [facility] vascular lab called to report patient's doppler was negative for Baker's Cyst and clot.

## 2014-10-27 NOTE — Telephone Encounter (Signed)
Pt called stating that [employer] did not receive pertinent information to approve her case for short term disability. The form needs to specify that she was out of work on Feb 18- March 21st due to adema and colon issues. Actual form has been placed today in dr. Noni Saupe folder to be filled out. Thank you.

## 2014-10-27 NOTE — Progress Notes (Signed)
Bilateral lower extremity venous duplex completed:  No evidence of DVT, superficial thrombosis, or Baker's cyst.  Technically difficult study due to the patient's body habitus.   

## 2014-10-27 NOTE — Telephone Encounter (Signed)
Pt called to check the status of paperwork that was faxed over from the insurance, please f/u

## 2014-10-28 ENCOUNTER — Encounter: Payer: Self-pay | Admitting: Podiatry

## 2014-10-28 ENCOUNTER — Ambulatory Visit (INDEPENDENT_AMBULATORY_CARE_PROVIDER_SITE_OTHER): Payer: Medicaid Other | Admitting: Podiatry

## 2014-10-28 VITALS — BP 125/69 | HR 82 | Resp 12

## 2014-10-28 DIAGNOSIS — T148 Other injury of unspecified body region: Secondary | ICD-10-CM

## 2014-10-28 DIAGNOSIS — M76829 Posterior tibial tendinitis, unspecified leg: Secondary | ICD-10-CM

## 2014-10-28 DIAGNOSIS — T148XXA Other injury of unspecified body region, initial encounter: Secondary | ICD-10-CM

## 2014-10-28 DIAGNOSIS — M779 Enthesopathy, unspecified: Secondary | ICD-10-CM

## 2014-10-31 ENCOUNTER — Other Ambulatory Visit: Payer: Self-pay | Admitting: Family Medicine

## 2014-10-31 DIAGNOSIS — R7989 Other specified abnormal findings of blood chemistry: Secondary | ICD-10-CM

## 2014-10-31 DIAGNOSIS — R590 Localized enlarged lymph nodes: Secondary | ICD-10-CM | POA: Insufficient documentation

## 2014-10-31 NOTE — Progress Notes (Signed)
Patient ID: [name], female   DOB: [birth year], 39 y.o.   MRN: [id number]  Subjective: 39 year old female returns the office today. Evaluation of left ankle pain and swelling. Since last appointment she has had a positive d-dimer an ultrasound of her leg which was negative for DVT. She is scheduled for CT angiogram. States that her left ankle continues to be swollen and she has pain to the ankle with ambulation. She denies any history of injury or trauma. Denies any associated erythema. He continues wearing an ankle brace. Denies any systemic complaints such as fevers, chills, nausea, vomiting. No acute changes since last appointment, and no other complaints at this time.   Objective: AAO x3, NAD DP/PT pulses palpable bilaterally, CRT less than 3 seconds Protective sensation intact with Simms Weinstein monofilament, vibratory sensation intact, Achilles tendon reflex intact There is tenderness along the course of posterior tibial tendon posterior and inferior to the medial malleolus and along the insertion into the navicular tuberosity. There is edema overlying this area without any associated erythema. She is able to perform a double heel rise however she is unable to perform a single heel rise in the left. She is able to perform a single heel rise on the right. There is mild pain with plantarflexion and inversion. There is a decrease in medial arch height upon weightbearing with equinus present. There is no other areas of pinpoint bony tenderness or pain the vibratory sensation to bilateral lower extremities.  MMT 5/5, ROM WNL. No edema, erythema, increase in warmth to bilateral lower extremities.  No open lesions or pre-ulcerative lesions.  No pain with calf compression, swelling, warmth, erythema  Assessment: 39 year old female with left ankle swelling, posterior tibial tendon dysfunction/tendinitis.  Plan: -All treatment options discussed with the patient including all alternatives, risks,  complications.  -At this time I recommend MRI of the left ankle to rule out tear or other pathology. -Continue with ankle brace. -Follow-up with primary care physician as well -Follow-up after MRI or sooner if any problems are to arise. -Patient encouraged to call the office with any questions, concerns, change in symptoms.

## 2014-10-31 NOTE — Assessment & Plan Note (Signed)
Enlarged inguinal lymph nodes  Normal recent CBC Normal recent CBC with diff No evidence of infection on recent exam ? Patient having any rash on legs or wound? Any vaginal discharge or sores?  Need to obtain screening HIV

## 2014-10-31 NOTE — Assessment & Plan Note (Addendum)
Doppler negative for DVT. Weakly positive D dimer  Wells Score is 0.  Will not obtain CTA or VQ scan given 0 well score

## 2014-11-01 ENCOUNTER — Telehealth: Payer: Self-pay | Admitting: Family Medicine

## 2014-11-01 ENCOUNTER — Other Ambulatory Visit: Payer: Self-pay | Admitting: Gastroenterology

## 2014-11-01 DIAGNOSIS — R1033 Periumbilical pain: Secondary | ICD-10-CM

## 2014-11-01 NOTE — Telephone Encounter (Signed)
Patient called stating that she dropped off paperwork for her PCP to fill out, it is for intermediate leave to cover her doctors appointments during work hours. Please f/u

## 2014-11-02 NOTE — Telephone Encounter (Signed)
Short term disability paperwork completed.  Placed in to be faxed.

## 2014-11-04 ENCOUNTER — Ambulatory Visit
Admission: RE | Admit: 2014-11-04 | Discharge: 2014-11-04 | Disposition: A | Discharge status: Home / Self Care | Payer: Medicaid Other | Source: Ambulatory Visit | Attending: Gastroenterology | Admitting: Gastroenterology

## 2014-11-04 DIAGNOSIS — R1033 Periumbilical pain: Secondary | ICD-10-CM

## 2014-11-04 NOTE — Telephone Encounter (Signed)
-----   Message from Boykin Nearing, MD sent at 10/31/2014  9:04 AM EDT ----- Doppler negative for DVT Enlarged inguinal lymph nodes  Normal recent CBC Normal recent CBC with diff No evidence of infection on recent exam ? Patient having any rash on legs or wound? Any vaginal discharge or sores?  Need to obtain screening HIV

## 2014-11-04 NOTE — Telephone Encounter (Signed)
Pt aware of results Transfer to front office to schedule appointment

## 2014-11-04 NOTE — Telephone Encounter (Signed)
-----   Message from Boykin Nearing, MD sent at 10/31/2014  9:13 AM EDT ----- Doppler negative for DVT Enlarged inguinal lymph nodes  Normal recent CBC Normal recent CBC with diff No evidence of infection on recent exam ? Patient having any rash on legs or wound? Any vaginal discharge or sores?  Need to obtain screening HIV Need pt to come in for f/u visit for pelvic exam

## 2014-11-09 ENCOUNTER — Telehealth: Payer: Self-pay | Admitting: General Practice

## 2014-11-09 NOTE — Telephone Encounter (Signed)
Patient presents to clinic to p/u paperwork. After reviewing paperwork patient states that the box on page 2 (Question 4) should be checked "Intermittenly" not "continuously"  Patient would like paperwork refaxed, once corrections are made. Patient will p/u copy. Please assist.

## 2014-11-10 NOTE — Telephone Encounter (Signed)
FMLA paperwork has been completely and re faxed. Patient was notified and paperwork is in accordion folder.

## 2014-11-10 NOTE — Telephone Encounter (Signed)
Please call back to patient. I have filled out the paperwork with intermittent leave.    Dr. Adrian Blackwater

## 2014-11-14 ENCOUNTER — Encounter: Payer: Self-pay | Admitting: Family Medicine

## 2014-11-14 ENCOUNTER — Ambulatory Visit: Payer: Medicaid Other | Attending: Family Medicine | Admitting: Family Medicine

## 2014-11-14 VITALS — BP 108/69 | HR 70 | Temp 98.0°F | Resp 18 | Ht 67.0 in | Wt 324.0 lb

## 2014-11-14 DIAGNOSIS — R0683 Snoring: Secondary | ICD-10-CM | POA: Diagnosis not present

## 2014-11-14 DIAGNOSIS — R0602 Shortness of breath: Secondary | ICD-10-CM | POA: Diagnosis not present

## 2014-11-14 DIAGNOSIS — R5382 Chronic fatigue, unspecified: Secondary | ICD-10-CM | POA: Diagnosis not present

## 2014-11-14 DIAGNOSIS — R590 Localized enlarged lymph nodes: Secondary | ICD-10-CM

## 2014-11-14 DIAGNOSIS — R600 Localized edema: Secondary | ICD-10-CM

## 2014-11-14 DIAGNOSIS — R599 Enlarged lymph nodes, unspecified: Secondary | ICD-10-CM | POA: Diagnosis not present

## 2014-11-14 NOTE — Progress Notes (Signed)
   Subjective:    Patient ID: [name], female    DOB: 1976/05/18, 39 y.o.   MRN: [id number] CC: f/u leg swelling and SOB, ? B/l inguinal adenopathy  HPI 39 yo F with morbid obesity  1. Leg swelling: improved with elevation.  Is always tired and "SOB". Snores at night. Chronically obese. Current smoker.   2. SOB: fatigue and daytime somnolence. Long term snoring. Has not had a sleep study. No cough or fever.   3. ? Inguinal adenopathy: on LE doppler. No skin rash, vaginal discharge, vulvar lesions. Due for screening HIV.   Soc Hx: current smoker  Review of Systems  Constitutional: Negative for fever, chills and fatigue.  Respiratory: Positive for shortness of breath.   Cardiovascular: Positive for leg swelling. Negative for chest pain and palpitations.      Objective:   Physical Exam BP 108/69 mmHg  Pulse 70  Temp(Src) 98 F (36.7 C) (Oral)  Resp 18  Ht 5\' 7"  (1.702 m)  Wt 324 lb (146.965 kg)  BMI 50.73 kg/m2  SpO2 98%  LMP 11/13/2014 Extremities: edema trace LE  Lymph nodes: Inguinal adenopathy: negative   Skin: no rashes  Pelvic: deferred, current on menstrual period     Assessment & Plan:

## 2014-11-14 NOTE — Patient Instructions (Signed)
[name],  Normal inguinal exam Screening HIV today  1. Snoring and feeling fatigue: Sleep study ordered   2. Leg swelling: Improving  Continue to elevate legs  Continue exercise, low salt diet, weight loss   F/u in 3 months for fatigue and leg swelling   Dr. Adrian Blackwater

## 2014-11-15 ENCOUNTER — Encounter: Payer: Self-pay | Admitting: Family Medicine

## 2014-11-15 LAB — HIV ANTIBODY (ROUTINE TESTING W REFLEX): HIV 1&2 Ab, 4th Generation: NONREACTIVE

## 2014-11-15 NOTE — Assessment & Plan Note (Signed)
A: Leg swelling: due to morbid obesity P: Improving  Continue to elevate legs  Continue exercise, low salt diet, weight loss

## 2014-11-15 NOTE — Assessment & Plan Note (Signed)
Normal inguinal exam Screening HIV today

## 2014-11-15 NOTE — Assessment & Plan Note (Addendum)
Snoring and feeling fatigue: I suspect OSA  Sleep study ordered

## 2014-11-16 ENCOUNTER — Encounter: Payer: Self-pay | Admitting: Internal Medicine

## 2014-11-18 ENCOUNTER — Ambulatory Visit: Payer: Self-pay | Admitting: Family Medicine

## 2014-11-22 ENCOUNTER — Telehealth: Payer: Self-pay | Admitting: *Deleted

## 2014-11-22 NOTE — Telephone Encounter (Signed)
LVM to return call.

## 2014-11-22 NOTE — Telephone Encounter (Signed)
-----   Message from Boykin Nearing, MD sent at 11/15/2014  9:08 AM EDT ----- Screening HIV negative

## 2014-11-28 NOTE — Telephone Encounter (Signed)
Pt aware of results 

## 2014-12-01 ENCOUNTER — Ambulatory Visit: Payer: Self-pay | Admitting: Dietician

## 2014-12-02 ENCOUNTER — Ambulatory Visit: Payer: Self-pay | Admitting: Dietician

## 2014-12-15 ENCOUNTER — Telehealth: Payer: Self-pay | Admitting: Family Medicine

## 2014-12-15 NOTE — Telephone Encounter (Signed)
Patients daughter came in to facility to drop of FMLA paperwork so the patient can go back to work, for any questions please f/u with pt.

## 2014-12-19 NOTE — Telephone Encounter (Signed)
Please call patient,  FMLA paperwork ready for pick up Patient asked to schedule f/u visit as I have determined that monthly visit focusing on weight loss is medically necessary and have written this on her FMLA paper work.

## 2014-12-19 NOTE — Telephone Encounter (Signed)
LVM Form at the front office ready to be pick up If any question please return call

## 2015-01-16 ENCOUNTER — Ambulatory Visit: Payer: Self-pay | Admitting: Family Medicine

## 2015-01-24 ENCOUNTER — Ambulatory Visit: Payer: Self-pay | Admitting: Family Medicine

## 2015-02-03 ENCOUNTER — Ambulatory Visit (HOSPITAL_BASED_OUTPATIENT_CLINIC_OR_DEPARTMENT_OTHER): Payer: Medicaid Other | Attending: Family Medicine

## 2015-02-03 DIAGNOSIS — R0683 Snoring: Secondary | ICD-10-CM | POA: Diagnosis not present

## 2015-02-03 DIAGNOSIS — G4700 Insomnia, unspecified: Secondary | ICD-10-CM | POA: Diagnosis not present

## 2015-02-03 DIAGNOSIS — G4733 Obstructive sleep apnea (adult) (pediatric): Secondary | ICD-10-CM | POA: Diagnosis present

## 2015-02-03 DIAGNOSIS — R0602 Shortness of breath: Secondary | ICD-10-CM

## 2015-02-05 ENCOUNTER — Encounter (HOSPITAL_BASED_OUTPATIENT_CLINIC_OR_DEPARTMENT_OTHER): Payer: Medicaid Other | Admitting: Internal Medicine

## 2015-02-05 DIAGNOSIS — R0683 Snoring: Secondary | ICD-10-CM

## 2015-02-05 DIAGNOSIS — R0602 Shortness of breath: Secondary | ICD-10-CM | POA: Diagnosis not present

## 2015-02-05 NOTE — Progress Notes (Signed)
  Patient Name: [name], [name] Study Date: 02/03/2015 Gender: Female D.O.B: 1976-02-06 Age (years): 41 Referring Provider: Adriana Mccallum Funches Height (inches): 93 Interpreting Physician: Baird Lyons MD, ABSM Weight (lbs): 318 RPSGT: Baxter Flattery BMI: 50 MRN: [id number] Neck Size: 16.00 CLINICAL INFORMATION Sleep Study Type: NPSG  Indication for sleep study: Excessive Daytime Sleepiness, Fatigue, Obesity, OSA, Snoring, Witnessed Apneas  Epworth Sleepiness Score: 9  SLEEP STUDY TECHNIQUE As per the AASM Manual for the Scoring of Sleep and Associated Events v2.3 (April 2016) with a hypopnea requiring 4% desaturations.  The channels recorded and monitored were frontal, central and occipital EEG, electrooculogram (EOG), submentalis EMG (chin), nasal and oral airflow, thoracic and abdominal wall motion, anterior tibialis EMG, snore microphone, electrocardiogram, and pulse oximetry.  MEDICATIONS Patient's medications include: charted for review Medications self-administered by patient during sleep study : No sleep medicine administered.  SLEEP ARCHITECTURE The study was initiated at 11:06:25 PM and ended at 5:27:53 AM.  Sleep onset time was 13.4 minutes and the sleep efficiency was 75.4%. The total sleep time was 287.6 minutes.  Stage REM latency was 153.0 minutes.  The patient spent 12.87% of the night in stage N1 sleep, 70.09% in stage N2 sleep, 0.17% in stage N3 and 16.87% in REM.  Alpha intrusion was absent.  Supine sleep was 60.59%.  Awake after sleep onset 80.5 minutes. Difficulty initiating and maintaining sleep. Tech described "restless all through the night".  RESPIRATORY PARAMETERS The overall apnea/hypopnea index (AHI) was 17.3 per hour. There were 16 total apneas, including 16 obstructive, 0 central and 0 mixed apneas. There were 67 hypopneas and 0 RERAs.  The AHI during Stage REM sleep was 43.3 per hour.  AHI while supine was 25.5 per hour.  The mean oxygen  saturation was 94.07%. The minimum SpO2 during sleep was 79.00%.  O2 saturation on room air was <= 88% for 2.1 minutes total during sleep.  Loud snoring was noted during this study.  CARDIAC DATA The 2 lead EKG demonstrated sinus rhythm. The mean heart rate was 71.80 beats per minute. Other EKG findings include: None.  LEG MOVEMENT DATA The total PLMS were 89 with a resulting PLMS index of 18.57. Associated arousal with leg movement index was 0.6 .  IMPRESSIONS Mild obstructive sleep apnea occurred during this study (AHI = 17.3/h). There were not enough early events to meet protocol requirements for split CPAP titration on this study night. No significant central sleep apnea occurred during this study (CAI = 0.0/h). Severe oxygen desaturation was noted during this study (Min O2 = 79.00%). The patient snored with Loud snoring volume. No cardiac abnormalities were noted during this study. Mild periodic limb movements of sleep occurred during the study. No significant associated arousals. Difficulty initiating and maintaining sleep, "restless".  DIAGNOSIS Obstructive Sleep Apnea (327.23 [G47.33 ICD-10]) Insomnia  RECOMMENDATIONS Therapeutic CPAP titration to determine optimal pressure required to alleviate sleep disordered breathing.   Positional therapy avoiding supine position during sleep. Avoid alcohol, sedatives and other CNS depressants that may worsen sleep apnea and disrupt normal sleep architecture. Sleep hygiene should be reviewed to assess factors that may improve sleep quality. Weight management and regular exercise should be initiated or continued if appropriate.  Deneise Lever Diplomate, American Board of Sleep Medicine  ELECTRONICALLY SIGNED ON:  02/05/2015, 11:38 AM [place] PH: [phone]   FX: [phone] [place]

## 2015-02-14 ENCOUNTER — Telehealth: Payer: Self-pay | Admitting: Family Medicine

## 2015-02-14 DIAGNOSIS — G4733 Obstructive sleep apnea (adult) (pediatric): Secondary | ICD-10-CM

## 2015-02-14 NOTE — Telephone Encounter (Signed)
Patient called requesting sleep study results, please f/u with patient

## 2015-02-14 NOTE — Telephone Encounter (Signed)
Error

## 2015-02-14 NOTE — Telephone Encounter (Signed)
Please inform patient that study revealed mild obstructive sleep apnea, Return for CPAP titration requested as there was not enough events/time to figure out best settings for CPAP Also, ask is patient is uninsured as if she is will pursue CPAP machine through assistance program with social worker

## 2015-02-17 ENCOUNTER — Ambulatory Visit (HOSPITAL_BASED_OUTPATIENT_CLINIC_OR_DEPARTMENT_OTHER): Payer: Medicaid Other

## 2015-03-02 ENCOUNTER — Ambulatory Visit (HOSPITAL_BASED_OUTPATIENT_CLINIC_OR_DEPARTMENT_OTHER): Payer: Medicaid Other | Attending: Family Medicine

## 2015-03-06 ENCOUNTER — Encounter: Payer: Self-pay | Admitting: Family Medicine

## 2015-03-06 NOTE — Progress Notes (Signed)
Received a second fax from patient's insurance company requesting medical records to determine short term disability.  Faxed placed in medical records request box. Office notes, letters, imaging should be provided per request.  Advised front office staff to contact patient if release form is not already on file.

## 2015-05-07 ENCOUNTER — Ambulatory Visit (HOSPITAL_BASED_OUTPATIENT_CLINIC_OR_DEPARTMENT_OTHER): Payer: Medicaid Other | Attending: Family Medicine

## 2015-05-07 ENCOUNTER — Ambulatory Visit (HOSPITAL_BASED_OUTPATIENT_CLINIC_OR_DEPARTMENT_OTHER): Payer: Self-pay

## 2015-05-09 ENCOUNTER — Encounter (HOSPITAL_BASED_OUTPATIENT_CLINIC_OR_DEPARTMENT_OTHER): Payer: Medicaid Other

## 2016-01-01 ENCOUNTER — Encounter (HOSPITAL_COMMUNITY): Payer: Self-pay | Admitting: *Deleted

## 2016-01-01 ENCOUNTER — Emergency Department (HOSPITAL_COMMUNITY)
Admission: EM | Admit: 2016-01-01 | Discharge: 2016-01-01 | Disposition: A | Discharge status: Home / Self Care | Payer: Managed Care, Other (non HMO) | Attending: Dermatology | Admitting: Dermatology

## 2016-01-01 ENCOUNTER — Ambulatory Visit (HOSPITAL_COMMUNITY)
Admission: EM | Admit: 2016-01-01 | Discharge: 2016-01-01 | Disposition: A | Discharge status: Other Acute Inpatient Hospital | Payer: Managed Care, Other (non HMO) | Attending: Emergency Medicine | Admitting: Emergency Medicine

## 2016-01-01 DIAGNOSIS — R079 Chest pain, unspecified: Secondary | ICD-10-CM

## 2016-01-01 DIAGNOSIS — Z5321 Procedure and treatment not carried out due to patient leaving prior to being seen by health care provider: Secondary | ICD-10-CM | POA: Insufficient documentation

## 2016-01-01 DIAGNOSIS — F17200 Nicotine dependence, unspecified, uncomplicated: Secondary | ICD-10-CM | POA: Diagnosis not present

## 2016-01-01 DIAGNOSIS — Z7982 Long term (current) use of aspirin: Secondary | ICD-10-CM | POA: Diagnosis not present

## 2016-01-01 DIAGNOSIS — I809 Phlebitis and thrombophlebitis of unspecified site: Secondary | ICD-10-CM

## 2016-01-01 LAB — CBC
HEMATOCRIT: 40.1 % (ref 36.0–46.0)
Hemoglobin: 12.7 g/dL (ref 12.0–15.0)
MCH: 29.7 pg (ref 26.0–34.0)
MCHC: 31.7 g/dL (ref 30.0–36.0)
MCV: 93.7 fL (ref 78.0–100.0)
PLATELETS: 285 10*3/uL (ref 150–400)
RBC: 4.28 MIL/uL (ref 3.87–5.11)
RDW: 13.7 % (ref 11.5–15.5)
WBC: 7.8 10*3/uL (ref 4.0–10.5)

## 2016-01-01 LAB — D-DIMER, QUANTITATIVE: D-Dimer, Quant: 0.47 ug/mL-FEU (ref 0.00–0.50)

## 2016-01-01 LAB — BASIC METABOLIC PANEL
Anion gap: 8 (ref 5–15)
BUN: 7 mg/dL (ref 6–20)
CO2: 26 mmol/L (ref 22–32)
CREATININE: 0.87 mg/dL (ref 0.44–1.00)
Calcium: 9.3 mg/dL (ref 8.9–10.3)
Chloride: 105 mmol/L (ref 101–111)
GFR calc Af Amer: >60 mL/min (ref >60)
GFR calc non Af Amer: >60 mL/min (ref >60)
Glucose, Bld: 89 mg/dL (ref 65–99)
POTASSIUM: 3.8 mmol/L (ref 3.5–5.1)
Sodium: 139 mmol/L (ref 135–145)

## 2016-01-01 LAB — I-STAT TROPONIN, ED: Troponin i, poc: 0 ng/mL (ref 0.00–0.08)

## 2016-01-01 NOTE — ED Notes (Signed)
Report  Phoned  To   [facility]  Nurse  First

## 2016-01-01 NOTE — ED Triage Notes (Signed)
Pt  Reports  Pain   Below   Sternum  As  Well  In left  Le    Sitting  Upright on  Exam table    Speaking in  Complete  sentances  Skin is  Warm and  dry

## 2016-01-01 NOTE — ED Provider Notes (Addendum)
[name]: [id number]     Arrival date & time 01/01/16  1456 History   None    Chief Complaint  Patient presents with  . Chest Pain   (Consider location/radiation/quality/duration/timing/severity/associated sxs/prior Treatment) Patient developed chest pain at work about 3 hours ago and she is concerned it could be from blood clot because her sister had PE and died from this.   The history is provided by the patient.  Chest Pain  Pain location:  Substernal area Pain quality: aching   Pain radiates to:  Does not radiate Duration:  3 hours Timing:  Constant Progression:  Unchanged Chronicity:  New Relieved by:  None tried Ineffective treatments:  None tried Risk factors: obesity     Past Medical History:  Diagnosis Date  . Cervical dysplasia 1993   . Migraine   . Obesity    Past Surgical History:  Procedure Laterality Date  . CESAREAN SECTION     x 4  . COLPOSCOPY    . TUBAL LIGATION Bilateral 2006  . UMBILICAL HERNIA REPAIR  2007   Family History  Problem Relation Age of Onset  . Ovarian cancer Mother 85  . Heart failure Mother   . Depression Mother   . Cancer Mother     uterine cancer   . Heart failure Father 79    MI  . Diabetes Father     type 2  . Hypertension Father   . Heart disease Sister    Social History  Substance Use Topics  . Smoking status: Current Every Day Smoker    Packs/day: 0.50  . Smokeless tobacco: Not on file  . Alcohol use No   OB History    Gravida Para Term Preterm AB Living   6 4 4   2 4    SAB TAB Ectopic Multiple Live Births   1 1           Review of Systems  Constitutional: Negative.   HENT: Negative.   Eyes: Negative.   Respiratory: Negative.   Cardiovascular: Positive for chest pain.  Gastrointestinal: Negative.   Endocrine: Negative.   Genitourinary: Negative.   Musculoskeletal: Negative.   Skin: Negative.   Allergic/Immunologic: Negative.   Neurological: Negative.   Hematological: Negative.    Psychiatric/Behavioral: Negative.     Allergies  Contrast media [iodinated diagnostic agents] and Morphine and related  Home Medications   Prior to Admission medications   Medication Sig Start Date End Date Taking? Authorizing Provider  aspirin EC 325 MG tablet Take 1 tablet (325 mg total) by mouth daily. 10/17/14   Josalyn Funches, MD  buPROPion (WELLBUTRIN SR) 150 MG 12 hr tablet Take 1 tablet (150 mg total) by mouth 2 (two) times daily. 10/14/14   Boykin Nearing, MD  cholecalciferol (VITAMIN D) 1000 UNITS tablet Take 1,000 Units by mouth daily.    Historical Provider, MD  Multiple Vitamin (MULTIVITAMIN) capsule Take 1 capsule by mouth daily.    Historical Provider, MD   Meds Ordered and Administered this Visit  Medications - No data to display  BP 138/67 (BP Location: Right Arm)   Pulse 80   Temp 98.5 F (36.9 C) (Oral)   Resp 16   LMP 12/25/2015   SpO2 99%  No data found.   Physical Exam  Urgent Care Course   Clinical Course    Procedures (including critical care time)  Labs Review Labs Reviewed - No data to display  Imaging Review No results found.   Visual Acuity Review  Right Eye Distance:   Left Eye Distance:   Bilateral Distance:    Right Eye Near:   Left Eye Near:    Bilateral Near:         MDM  Chest pain Superficial thrombophlebitis left leg Left shoulder pain  Patient has family hx of DVT and PE and recommend she go to ED to get chest pain worked up.     Lysbeth Penner, FNP 01/01/16 [place], FNP 01/01/16 1649  Medical screening examination/treatment/procedure(s) were performed by resident physician or non-physician practitioner and as supervising physician I was immediately available for consultation/collaboration.  Maryruth Eve, MD      Melony Overly, MD 01/03/16 [phone]

## 2016-01-01 NOTE — ED Triage Notes (Signed)
States started to have cp today and states that on Friday noticed she had  Burst a blood vessel in her left leg,.  Left leg hurts and her chest hurts

## 2016-01-01 NOTE — ED Notes (Signed)
Pt RN first, stating the wait time is ridiculous and she did not want to wait. RN encouraged pt to stay and apologized for wait, attempted to explain delay, pt was upset and stated she was going to leave. Asked to call security for ride to car. Security reported it would be 15 minutes. Pt unhappy that she had to wait for a ride to her car.

## 2016-03-11 NOTE — Progress Notes (Signed)
Pt did not get

## 2017-01-14 ENCOUNTER — Ambulatory Visit (HOSPITAL_COMMUNITY): Admit: 2017-01-14 | Payer: Self-pay

## 2017-01-14 ENCOUNTER — Encounter (HOSPITAL_COMMUNITY): Payer: Self-pay | Admitting: Emergency Medicine

## 2017-01-14 ENCOUNTER — Emergency Department (HOSPITAL_COMMUNITY)
Admission: EM | Admit: 2017-01-14 | Discharge: 2017-01-14 | Disposition: A | Discharge status: Home / Self Care | Payer: Managed Care, Other (non HMO) | Attending: Emergency Medicine | Admitting: Emergency Medicine

## 2017-01-14 DIAGNOSIS — Z5321 Procedure and treatment not carried out due to patient leaving prior to being seen by health care provider: Secondary | ICD-10-CM | POA: Insufficient documentation

## 2017-01-14 DIAGNOSIS — M79662 Pain in left lower leg: Secondary | ICD-10-CM | POA: Insufficient documentation

## 2017-01-14 NOTE — ED Triage Notes (Signed)
Pt has pain in left leg right behind knee, pt is worried because her sister passed from a blood clot in leg.

## 2017-01-18 ENCOUNTER — Encounter (HOSPITAL_COMMUNITY): Payer: Self-pay | Admitting: Emergency Medicine

## 2017-01-18 DIAGNOSIS — Z8249 Family history of ischemic heart disease and other diseases of the circulatory system: Secondary | ICD-10-CM | POA: Insufficient documentation

## 2017-01-18 DIAGNOSIS — F17200 Nicotine dependence, unspecified, uncomplicated: Secondary | ICD-10-CM | POA: Insufficient documentation

## 2017-01-18 DIAGNOSIS — M79662 Pain in left lower leg: Secondary | ICD-10-CM | POA: Insufficient documentation

## 2017-01-18 NOTE — ED Triage Notes (Signed)
Pt c/o left leg pain x 2-3 weeks. States she was seen previously for same, an ultrasound was ordered to check for blood clot, pt left before procedure was done. Today, pain with standing with some difficulty walking.

## 2017-01-19 ENCOUNTER — Emergency Department (HOSPITAL_COMMUNITY)
Admission: EM | Admit: 2017-01-19 | Discharge: 2017-01-19 | Disposition: A | Discharge status: Home / Self Care | Payer: Managed Care, Other (non HMO) | Attending: Emergency Medicine | Admitting: Emergency Medicine

## 2017-01-19 ENCOUNTER — Ambulatory Visit (HOSPITAL_COMMUNITY): Payer: Managed Care, Other (non HMO) | Attending: Emergency Medicine

## 2017-01-19 DIAGNOSIS — M79605 Pain in left leg: Secondary | ICD-10-CM

## 2017-01-19 MED ORDER — ACETAMINOPHEN 325 MG PO TABS
650.0000 mg | ORAL_TABLET | Freq: Once | ORAL | Status: AC
  Administered 2017-01-19: 650 mg via ORAL
  Filled 2017-01-19: qty 2

## 2017-01-19 MED ORDER — HYDROCODONE-ACETAMINOPHEN 5-325 MG PO TABS: 1.0000 | ORAL_TABLET | ORAL | 0 refills | Status: DC | PRN

## 2017-01-19 MED ORDER — ENOXAPARIN SODIUM 150 MG/ML ~~LOC~~ SOLN
145.0000 mg | Freq: Once | SUBCUTANEOUS | Status: AC
  Administered 2017-01-19: 145 mg via SUBCUTANEOUS
  Filled 2017-01-19: qty 0.97

## 2017-01-19 NOTE — ED Notes (Signed)
Pt called out requesting Tylenol. Nurse notified.

## 2017-01-19 NOTE — Discharge Instructions (Signed)
Return in the morning as instructed for doppler of left lower leg. Follow up with your doctor for further management. Take norco for pain if needed.

## 2017-01-19 NOTE — Progress Notes (Signed)
ANTICOAGULATION CONSULT NOTE - Initial Consult  Pharmacy Consult for Lovenox Indication: r/o DVT  Allergies  Allergen Reactions  . Contrast Media [Iodinated Diagnostic Agents] Other (See Comments)    Pt became hypotensive,lightheaded,near syncopal the last 2 times she had contrast( prior to 2007)////////NEW UPDATE, COUGHING, TROUBLE BREATHING AND HIVES PER PT//A.CALHOUN  . Morphine And Related Itching    Patient Measurements: Height: 5\' 7"  (170.2 cm) Weight: (!) 320 lb (145.2 kg) IBW/kg (Calculated) : 61.6  Vital Signs: Temp: 97.8 F (36.6 C) (08/12 0205) Temp Source: Oral (08/12 0205) BP: 138/74 (08/12 0315) Pulse Rate: 76 (08/12 0315)    Assessment: 41yo female c/o LLE pain progressive over several weeks, evaluated for same <1wk ago but no show to Doppler appt, to start LMWH until further evaluation.  Goal of Therapy:  Anti-Xa level 0.6-1 units/ml 4hrs after LMWH dose given Monitor platelets by anticoagulation protocol: Yes   Plan:  Will give Lovenox 145mg  SQ x1; recommend 145mg  SQ Q12H if to continue.  Wynona Neat, PharmD, BCPS  01/19/2017,4:17 AM

## 2017-01-19 NOTE — ED Provider Notes (Signed)
[facility] DEPT Provider Note   [name]: [id number] Arrival date & time: 01/18/17  2326     History   Chief Complaint Chief Complaint  Patient presents with  . Leg Pain    HPI [name] is a 41 y.o. female.  Patient presents with complaint of unilateral LE pain for the past one month, progressive in nature. The pain affects the lower leg to the back of the knee and occasionally extends to the medial thigh. She does not notice any swelling. No redness or fever. She denies CP, SOB. She reports a history of positive d-dimer without positive doppler study, and a strong family history of DVT and PE.  No known injury to the leg.   The history is provided by the patient. No language interpreter was used.  Leg Pain   Pertinent negatives include no numbness.    Past Medical History:  Diagnosis Date  . Cervical dysplasia 1993   . Migraine   . Obesity     Patient Active Problem List   Diagnosis Date Noted  . Mild obstructive sleep apnea 02/14/2015  . Snoring 11/14/2014  . Inguinal adenopathy 10/31/2014  . Positive D dimer 10/17/2014  . Pedal edema 10/14/2014  . Fatigue 10/14/2014  . Severe obesity (BMI >= 40) ([place]) 08/07/2006  . TOBACCO DEPENDENCE 08/07/2006  . MIGRAINE, UNSPEC., W/O INTRACTABLE MIGRAINE 08/07/2006  . RHINITIS, ALLERGIC 08/07/2006    Past Surgical History:  Procedure Laterality Date  . CESAREAN SECTION     x 4  . COLPOSCOPY    . TUBAL LIGATION Bilateral 2006  . UMBILICAL HERNIA REPAIR  2007    OB History    Gravida Para Term Preterm AB Living   6 4 4   2 4    SAB TAB Ectopic Multiple Live Births   1 1     4        Home Medications    Prior to Admission medications   Medication Sig Start Date End Date Taking? Authorizing Provider  naproxen sodium (ANAPROX) 220 MG tablet Take 220 mg by mouth 2 (two) times daily with a meal.   Yes [provider]  TURMERIC PO Take 1 tablet by mouth daily.   Yes [provider]  aspirin EC  325 MG tablet Take 1 tablet (325 mg total) by mouth daily. Patient not taking: Reported on 01/19/2017 10/17/14   Boykin Nearing, MD  buPROPion [facility] SR) 150 MG 12 hr tablet Take 1 tablet (150 mg total) by mouth 2 (two) times daily. Patient not taking: Reported on 01/19/2017 10/14/14   Boykin Nearing, MD    Family History Family History  Problem Relation Age of Onset  . Ovarian cancer Mother 56  . Heart failure Mother   . Depression Mother   . Cancer Mother        uterine cancer   . Heart failure Father 10       MI  . Diabetes Father        type 2  . Hypertension Father   . Heart disease Sister     Social History Social History  Substance Use Topics  . Smoking status: Current Every Day Smoker    Packs/day: 0.50  . Smokeless tobacco: Never Used  . Alcohol use No     Allergies   Contrast media [iodinated diagnostic agents] and Morphine and related   Review of Systems Review of Systems  Constitutional: Negative for chills and fever.  Respiratory: Negative.  Negative for shortness of  breath.   Cardiovascular: Negative.  Negative for chest pain.  Gastrointestinal: Negative.   Musculoskeletal:       See HPI.  Skin: Negative.  Negative for color change.  Neurological: Negative.  Negative for weakness and numbness.     Physical Exam Updated Vital Signs BP 130/77 (BP Location: Right Arm)   Pulse 77   Temp 97.8 F (36.6 C) (Oral)   Resp 16   LMP 01/17/2017   SpO2 100%   Physical Exam  Constitutional: She is oriented to person, place, and time. She appears well-developed and well-nourished.  Neck: Normal range of motion.  Cardiovascular: Normal rate and intact distal pulses.   Pulmonary/Chest: Effort normal.  Musculoskeletal:  Left lower extremity is not swollen. There is no redness or warmth. There is no reproducible tenderness. Distal pulses are intact and equal. No strength deficits. Thigh nontender.  Neurological: She is alert and oriented to person,  place, and time.  Skin: Skin is warm and dry.     ED Treatments / Results  Labs (all labs ordered are listed, but only abnormal results are displayed) Labs Reviewed - No data to display  EKG  EKG Interpretation None       Radiology No results found.  Procedures Procedures (including critical care time)  Medications Ordered in ED Medications - No data to display   Initial Impression / Assessment and Plan / ED Course  I have reviewed the triage vital signs and the nursing notes.  Pertinent labs & imaging results that were available during my care of the patient were reviewed by me and considered in my medical decision making (see chart for details).     Patient with a complaint of unilateral LE pain, progressive for weeks. She is concerned because she reports sister died from complications stemming from DVT/PE.   Suspect musculoskeletal pain, clot less likely given symptoms and exam. Will give Lovenox and have her return in the morning for doppler of the left LE. She will follow up with her doctor for further outpatient evaluation.   Final Clinical Impressions(s) / ED Diagnoses   Final diagnoses:  None   1. LE pain  New Prescriptions New Prescriptions   No medications on file     Dennie Bible 28/78/67 6720    Delora Fuel, MD 94/70/96 [phone]

## 2017-01-21 ENCOUNTER — Encounter (HOSPITAL_COMMUNITY): Payer: Self-pay | Admitting: Emergency Medicine

## 2017-01-21 ENCOUNTER — Emergency Department (HOSPITAL_COMMUNITY)
Admission: EM | Admit: 2017-01-21 | Discharge: 2017-01-22 | Disposition: A | Discharge status: Home / Self Care | Payer: Managed Care, Other (non HMO) | Attending: Emergency Medicine | Admitting: Emergency Medicine

## 2017-01-21 DIAGNOSIS — M25562 Pain in left knee: Secondary | ICD-10-CM | POA: Insufficient documentation

## 2017-01-21 DIAGNOSIS — Z79899 Other long term (current) drug therapy: Secondary | ICD-10-CM | POA: Insufficient documentation

## 2017-01-21 DIAGNOSIS — F17210 Nicotine dependence, cigarettes, uncomplicated: Secondary | ICD-10-CM | POA: Insufficient documentation

## 2017-01-21 DIAGNOSIS — Z7982 Long term (current) use of aspirin: Secondary | ICD-10-CM | POA: Insufficient documentation

## 2017-01-21 MED ORDER — ENOXAPARIN SODIUM 150 MG/ML ~~LOC~~ SOLN
1.0000 mg/kg | Freq: Once | SUBCUTANEOUS | Status: AC
  Administered 2017-01-21: 145 mg via SUBCUTANEOUS
  Filled 2017-01-21: qty 0.97

## 2017-01-21 MED ORDER — HYDROMORPHONE HCL 1 MG/ML IJ SOLN
1.0000 mg | Freq: Once | INTRAMUSCULAR | Status: AC
  Administered 2017-01-21: 1 mg via INTRAMUSCULAR
  Filled 2017-01-21: qty 1

## 2017-01-21 NOTE — ED Triage Notes (Signed)
Pt sts left leg pain with some pain behind knee; pt seen here Saturday and was supposed to have doppler but wasn't able to come

## 2017-01-21 NOTE — ED Notes (Signed)
Vascular contacted and stated they would not be able to see her until tomorrow morning at 0800.

## 2017-01-21 NOTE — ED Notes (Signed)
Pt left at this time with all belongings.  

## 2017-01-21 NOTE — Discharge Instructions (Signed)
Please report to [facility] radiology at 8am tomorrow morning for your venous doppler study. Use crutches. Keep knee elevated. Continue tylenol for pain. IF your study is negative or shows a baker cyst, follow up with orthopedics.

## 2017-01-21 NOTE — ED Provider Notes (Signed)
[facility] DEPT Provider Note   [name]: [id number] Arrival date & time: 01/21/17  1827     History   Chief Complaint Chief Complaint  Patient presents with  . Leg Pain    HPI [name] is a 42 y.o. female.  HPI [name] is a 41 y.o. female with history of obesity, migraine headache, presents to emergency department complaining of left knee pain. Patient states that she has developed knee pain one month ago. She states she just started new job where she has to walk up and down a lot of stairs. She states gradually in the last month her pain has been getting worse. She states pain radiates into the foot and into the groin. Pain is mainly behind the knee. She denies any injuries. She was seen here several days ago and was scheduled for venous duplex, but states she did not know where to go and when she called no one answered. She did not have the test done. She did not follow with her doctor. She is taking Tylenol and Vicodin for pain which is not helping. She took 2 Vicodin while in the waiting room just an hour ago which did not help. Patient states her pain is severe, 10 out of 10. She reports tingling in her toes. Denies any numbness or weakness in her foot. Denies any back pain. Denies any fever or chills. She states that she has family history of blood clots.   Past Medical History:  Diagnosis Date  . Cervical dysplasia 1993   . Migraine   . Obesity     Patient Active Problem List   Diagnosis Date Noted  . Mild obstructive sleep apnea 02/14/2015  . Snoring 11/14/2014  . Inguinal adenopathy 10/31/2014  . Positive D dimer 10/17/2014  . Pedal edema 10/14/2014  . Fatigue 10/14/2014  . Severe obesity (BMI >= 40) ([place]) 08/07/2006  . TOBACCO DEPENDENCE 08/07/2006  . MIGRAINE, UNSPEC., W/O INTRACTABLE MIGRAINE 08/07/2006  . RHINITIS, ALLERGIC 08/07/2006    Past Surgical History:  Procedure Laterality Date  . CESAREAN SECTION     x 4  . COLPOSCOPY    . TUBAL  LIGATION Bilateral 2006  . UMBILICAL HERNIA REPAIR  2007    OB History    Gravida Para Term Preterm AB Living   6 4 4   2 4    SAB TAB Ectopic Multiple Live Births   1 1     4        Home Medications    Prior to Admission medications   Medication Sig Start Date End Date Taking? Authorizing Provider  aspirin EC 325 MG tablet Take 1 tablet (325 mg total) by mouth daily. Patient not taking: Reported on 01/19/2017 10/17/14   Boykin Nearing, MD  buPROPion [facility] SR) 150 MG 12 hr tablet Take 1 tablet (150 mg total) by mouth 2 (two) times daily. Patient not taking: Reported on 01/19/2017 10/14/14   Boykin Nearing, MD  HYDROcodone-acetaminophen (NORCO/VICODIN) 5-325 MG tablet Take 1-2 tablets by mouth every 4 (four) hours as needed. 01/19/17   Charlann Lange, PA-C  naproxen sodium (ANAPROX) 220 MG tablet Take 220 mg by mouth 2 (two) times daily with a meal.    [provider]  TURMERIC PO Take 1 tablet by mouth daily.    [provider]    Family History Family History  Problem Relation Age of Onset  . Ovarian cancer Mother 26  . Heart failure Mother   . Depression Mother   .  Cancer Mother        uterine cancer   . Heart failure Father 34       MI  . Diabetes Father        type 2  . Hypertension Father   . Heart disease Sister     Social History Social History  Substance Use Topics  . Smoking status: Current Every Day Smoker    Packs/day: 0.50  . Smokeless tobacco: Never Used  . Alcohol use No     Allergies   Contrast media [iodinated diagnostic agents] and Morphine and related   Review of Systems Review of Systems  Constitutional: Negative for chills and fever.  Respiratory: Negative for cough, chest tightness and shortness of breath.   Cardiovascular: Negative for chest pain, palpitations and leg swelling.  Musculoskeletal: Positive for arthralgias and myalgias. Negative for neck pain and neck stiffness.  Skin: Negative for rash.    Neurological: Negative for dizziness, weakness and headaches.  All other systems reviewed and are negative.    Physical Exam Updated Vital Signs BP 135/84 (BP Location: Right Arm)   Pulse 77   Temp 98.6 F (37 C) (Oral)   Resp 18   LMP 01/17/2017   SpO2 99%   Physical Exam  Constitutional: She appears well-developed and well-nourished. No distress.  HENT:  Head: Normocephalic.  Eyes: Conjunctivae are normal.  Neck: Neck supple.  Cardiovascular: Normal rate, regular rhythm and normal heart sounds.   Pulmonary/Chest: Effort normal and breath sounds normal. No respiratory distress. She has no wheezes. She has no rales.  Musculoskeletal: She exhibits no edema.  Tenderness to palpation behind left knee. I do not feel any obvious swelling, skin is not erythematous, normal colored. There is no warmth to the touch over the joint. Pain with full extension of the knee joint, full flexion. No pain with left straight leg raise. Dorsal pedal pulse intact.  Neurological: She is alert.  Sensation intact distally.  Skin: Skin is warm and dry.  Psychiatric: She has a normal mood and affect. Her behavior is normal.  Nursing note and vitals reviewed.    ED Treatments / Results  Labs (all labs ordered are listed, but only abnormal results are displayed) Labs Reviewed - No data to display  EKG  EKG Interpretation None       Radiology No results found.  Procedures Procedures (including critical care time)  Medications Ordered in ED Medications  HYDROmorphone (DILAUDID) injection 1 mg (not administered)  enoxaparin (LOVENOX) injection 145 mg (not administered)     Initial Impression / Assessment and Plan / ED Course  I have reviewed the triage vital signs and the nursing notes.  Pertinent labs & imaging results that were available during my care of the patient were reviewed by me and considered in my medical decision making (see chart for details).     Patient with  worsening left knee pain which is radiating to the groin and foot over the last month. She states it is the worst in the last few days. Patient is crying and screaming. She took 2 Vicodin in the waiting room which did not help. She states this is the worst pain. She did not show up for her venous duplex which was ordered several days ago for the same. She denies any injuries. Joint does not appear to be warm to the touch, there is no skin erythema. I do not think there is any signs of infection. She does have pain with complete extension  at the knee joint. Suspect she may have a Baker cyst versus a DVT. Possibly meniscal tear. Also possibly could have radicular pain from her back, although her straight leg raise was negative. At this time there is no evidence of cauda equina. I will give her a dose of Lovenox, will have her come back for venous duplex tomorrow morning. This will help [place] rule out DVT and a Baker cyst as the cause of her pain. Will give Dilaudid IM for pain given severe pain and crutches. Will have a follow-up with orthopedics otherwise.  Vitals:   01/21/17 1834 01/21/17 2034 01/21/17 2208 01/21/17 2300  BP: 136/89 136/79 135/84 133/78  Pulse:  75 77 77  Resp: 18 18  18   Temp: 98.2 F (36.8 C) 98.6 F (37 C)    TempSrc: Oral Oral    SpO2: 100% 98% 99% 100%     Final Clinical Impressions(s) / ED Diagnoses   Final diagnoses:  Acute pain of left knee    New Prescriptions Discharge Medication List as of 01/21/2017 11:34 PM       Jeannett Senior, PA-C 01/22/17 0003    Tegeler, Gwenyth Allegra, MD 01/22/17 1244

## 2017-01-22 ENCOUNTER — Ambulatory Visit (HOSPITAL_COMMUNITY)
Admission: RE | Admit: 2017-01-22 | Discharge: 2017-01-22 | Disposition: A | Discharge status: Home / Self Care | Payer: Self-pay | Source: Ambulatory Visit | Attending: Emergency Medicine | Admitting: Emergency Medicine

## 2017-01-22 ENCOUNTER — Encounter (HOSPITAL_COMMUNITY): Payer: Self-pay | Admitting: Emergency Medicine

## 2017-01-22 DIAGNOSIS — M79605 Pain in left leg: Secondary | ICD-10-CM | POA: Insufficient documentation

## 2017-01-22 DIAGNOSIS — M79609 Pain in unspecified limb: Secondary | ICD-10-CM

## 2017-01-22 NOTE — Progress Notes (Signed)
*  PRELIMINARY RESULTS* Vascular Ultrasound Left lower extremity venous duplex has been completed.  Preliminary findings: No evidence of deep vein thrombosis or baker's cysts in the left lower extremity.   Myrtie Cruise Phylicia Mcgaugh 01/22/2017, 9:02 AM

## 2017-06-02 ENCOUNTER — Emergency Department (HOSPITAL_COMMUNITY)
Admission: EM | Admit: 2017-06-02 | Discharge: 2017-06-02 | Disposition: A | Discharge status: Home / Self Care | Payer: Self-pay | Attending: Emergency Medicine | Admitting: Emergency Medicine

## 2017-06-02 ENCOUNTER — Other Ambulatory Visit: Payer: Self-pay

## 2017-06-02 ENCOUNTER — Emergency Department (HOSPITAL_COMMUNITY): Payer: Self-pay

## 2017-06-02 ENCOUNTER — Encounter (HOSPITAL_COMMUNITY): Payer: Self-pay | Admitting: *Deleted

## 2017-06-02 DIAGNOSIS — N83291 Other ovarian cyst, right side: Secondary | ICD-10-CM | POA: Insufficient documentation

## 2017-06-02 DIAGNOSIS — R1013 Epigastric pain: Secondary | ICD-10-CM | POA: Insufficient documentation

## 2017-06-02 DIAGNOSIS — Z79899 Other long term (current) drug therapy: Secondary | ICD-10-CM | POA: Insufficient documentation

## 2017-06-02 DIAGNOSIS — F17200 Nicotine dependence, unspecified, uncomplicated: Secondary | ICD-10-CM | POA: Insufficient documentation

## 2017-06-02 LAB — CBC
HEMATOCRIT: 42 % (ref 36.0–46.0)
HEMOGLOBIN: 13.3 g/dL (ref 12.0–15.0)
MCH: 29.3 pg (ref 26.0–34.0)
MCHC: 31.7 g/dL (ref 30.0–36.0)
MCV: 92.5 fL (ref 78.0–100.0)
Platelets: 317 10*3/uL (ref 150–400)
RBC: 4.54 MIL/uL (ref 3.87–5.11)
RDW: 13.5 % (ref 11.5–15.5)
WBC: 6 10*3/uL (ref 4.0–10.5)

## 2017-06-02 LAB — COMPREHENSIVE METABOLIC PANEL
ALBUMIN: 3.6 g/dL (ref 3.5–5.0)
ALK PHOS: 60 U/L (ref 38–126)
ALT: 20 U/L (ref 14–54)
ANION GAP: 7 (ref 5–15)
AST: 20 U/L (ref 15–41)
BUN: 6 mg/dL (ref 6–20)
CALCIUM: 9.3 mg/dL (ref 8.9–10.3)
CO2: 26 mmol/L (ref 22–32)
Chloride: 105 mmol/L (ref 101–111)
Creatinine, Ser: 0.83 mg/dL (ref 0.44–1.00)
GFR calc Af Amer: >60 mL/min (ref >60)
GFR calc non Af Amer: >60 mL/min (ref >60)
Glucose, Bld: 118 mg/dL — ABNORMAL HIGH (ref 65–99)
Potassium: 4.1 mmol/L (ref 3.5–5.1)
SODIUM: 138 mmol/L (ref 135–145)
Total Bilirubin: 1.3 mg/dL — ABNORMAL HIGH (ref 0.3–1.2)
Total Protein: 6.6 g/dL (ref 6.5–8.1)

## 2017-06-02 LAB — URINALYSIS, ROUTINE W REFLEX MICROSCOPIC
BACTERIA UA: NONE SEEN
Bilirubin Urine: NEGATIVE
Glucose, UA: NEGATIVE mg/dL
Ketones, ur: NEGATIVE mg/dL
Leukocytes, UA: NEGATIVE
Nitrite: NEGATIVE
PROTEIN: NEGATIVE mg/dL
SPECIFIC GRAVITY, URINE: 1.024 (ref 1.005–1.030)
pH: 6 (ref 5.0–8.0)

## 2017-06-02 LAB — LIPASE, BLOOD: Lipase: 20 U/L (ref 11–51)

## 2017-06-02 LAB — I-STAT BETA HCG BLOOD, ED (MC, WL, AP ONLY)

## 2017-06-02 MED ORDER — BARIUM SULFATE 2.1 % PO SUSP
ORAL | Status: AC
  Filled 2017-06-02: qty 2

## 2017-06-02 MED ORDER — METOCLOPRAMIDE HCL 5 MG/ML IJ SOLN
5.0000 mg | Freq: Once | INTRAMUSCULAR | Status: AC
  Administered 2017-06-02: 5 mg via INTRAVENOUS
  Filled 2017-06-02: qty 2

## 2017-06-02 NOTE — ED Notes (Signed)
Pt expressing frustration and the need to leave AMA due to needing to take her daughter to [place] by a certain time.

## 2017-06-02 NOTE — ED Notes (Signed)
Pt to CT

## 2017-06-02 NOTE — ED Provider Notes (Signed)
[facility] EMERGENCY DEPARTMENT Provider Note   [name]: [id number] Arrival date & time: 06/02/17  7619     History   Chief Complaint Chief Complaint  Patient presents with  . Abdominal Pain    HPI [name] is a 41 y.o. female.  PResents with upper abdominal pain and hypogastric pain vaginal onset 2 weeks ago.  Pain is worse with sitting up and improved with lying down.  Pain was onset after taking NSAIDs for muscle strain after shoveling snow.  Last bowel movement yesterday normal.  No fever.  Pain is severe.  Burning in nature.  Nonradiating.  Last normal menstrual period 05/21/2017.  No urinary symptoms.  No other associated symptoms.  Treated self with MiraLAX, without relief.  HPI  Past Medical History:  Diagnosis Date  . Cervical dysplasia 1993   . Migraine   . Obesity     Patient Active Problem List   Diagnosis Date Noted  . Mild obstructive sleep apnea 02/14/2015  . Snoring 11/14/2014  . Inguinal adenopathy 10/31/2014  . Positive D dimer 10/17/2014  . Pedal edema 10/14/2014  . Fatigue 10/14/2014  . Severe obesity (BMI >= 40) ([place]) 08/07/2006  . TOBACCO DEPENDENCE 08/07/2006  . MIGRAINE, UNSPEC., W/O INTRACTABLE MIGRAINE 08/07/2006  . RHINITIS, ALLERGIC 08/07/2006    Past Surgical History:  Procedure Laterality Date  . CESAREAN SECTION     x 4  . COLPOSCOPY    . TUBAL LIGATION Bilateral 2006  . UMBILICAL HERNIA REPAIR  2007    OB History    Gravida Para Term Preterm AB Living   6 4 4   2 4    SAB TAB Ectopic Multiple Live Births   1 1     4        Home Medications    Prior to Admission medications   Medication Sig Start Date End Date Taking? Authorizing Provider  Misc Natural Products (OSTEO BI-FLEX JOINT SHIELD PO) Take 1 tablet by mouth daily.   Yes [provider]  Multiple Vitamin (MULTIVITAMIN WITH MINERALS) TABS tablet Take 1 tablet by mouth daily.   Yes [provider]  naproxen sodium (ANAPROX) 220  MG tablet Take 220 mg by mouth 2 (two) times daily with a meal.   Yes [provider]  TURMERIC PO Take 1 tablet by mouth daily.   Yes [provider]  aspirin EC 325 MG tablet Take 1 tablet (325 mg total) by mouth daily. Patient not taking: Reported on 01/19/2017 10/17/14   Boykin Nearing, MD  buPROPion [facility] SR) 150 MG 12 hr tablet Take 1 tablet (150 mg total) by mouth 2 (two) times daily. Patient not taking: Reported on 01/19/2017 10/14/14   Boykin Nearing, MD  HYDROcodone-acetaminophen (NORCO/VICODIN) 5-325 MG tablet Take 1-2 tablets by mouth every 4 (four) hours as needed. Patient not taking: Reported on 06/02/2017 01/19/17   Charlann Lange, PA-C    Family History Family History  Problem Relation Age of Onset  . Ovarian cancer Mother 67  . Heart failure Mother   . Depression Mother   . Cancer Mother        uterine cancer   . Heart failure Father 66       MI  . Diabetes Father        type 2  . Hypertension Father   . Heart disease Sister     Social History Social History   Tobacco Use  . Smoking status: Current Every Day Smoker  Packs/day: 0.50  . Smokeless tobacco: Never Used  Substance Use Topics  . Alcohol use: No    Alcohol/week: 0.0 oz  . Drug use: No     Allergies   Contrast media [iodinated diagnostic agents] and Morphine and related   Review of Systems Review of Systems  Constitutional: Negative.   HENT: Negative.   Respiratory: Negative.   Cardiovascular: Negative.   Gastrointestinal: Positive for abdominal pain and nausea.  Musculoskeletal: Negative.   Skin: Negative.   Neurological: Negative.   Psychiatric/Behavioral: Negative.   All other systems reviewed and are negative.    Physical Exam Updated Vital Signs BP 110/69 (BP Location: Right Arm)   Pulse 73   Temp 98.9 F (37.2 C)   Resp 18   Ht 5\' 7"  (1.702 m)   Wt 136.1 kg (300 lb)   LMP 05/21/2017   SpO2 99%   BMI 46.99 kg/m   Physical Exam    Constitutional: She appears well-developed and well-nourished.  HENT:  Head: Normocephalic and atraumatic.  Eyes: Conjunctivae are normal. Pupils are equal, round, and reactive to light.  Neck: Neck supple. No tracheal deviation present. No thyromegaly present.  Cardiovascular: Normal rate and regular rhythm.  No murmur heard. Pulmonary/Chest: Effort normal and breath sounds normal.  Abdominal: Soft. Bowel sounds are normal. She exhibits no distension. There is tenderness. There is no guarding.  Morbidly obese infraumbilical surgical scar.  Tender at hypogastrium and epigastrium  Musculoskeletal: Normal range of motion. She exhibits no edema or tenderness.  Neurological: She is alert. Coordination normal.  Skin: Skin is warm and dry. No rash noted.  Psychiatric: She has a normal mood and affect.  Nursing note and vitals reviewed.    ED Treatments / Results  Labs (all labs ordered are listed, but only abnormal results are displayed) Labs Reviewed  COMPREHENSIVE METABOLIC PANEL - Abnormal; Notable for the following components:      Result Value   Glucose, Bld 118 (*)    Total Bilirubin 1.3 (*)    All other components within normal limits  URINALYSIS, ROUTINE W REFLEX MICROSCOPIC - Abnormal; Notable for the following components:   APPearance HAZY (*)    Hgb urine dipstick SMALL (*)    Squamous Epithelial / LPF 0-5 (*)    All other components within normal limits  LIPASE, BLOOD  CBC  I-STAT BETA HCG BLOOD, ED ([facility], [facility], [facility] ONLY)   Results for orders placed or performed during the hospital encounter of 06/02/17  Lipase, blood  Result Value Ref Range   Lipase 20 11 - 51 U/L  Comprehensive metabolic panel  Result Value Ref Range   Sodium 138 135 - 145 mmol/L   Potassium 4.1 3.5 - 5.1 mmol/L   Chloride 105 101 - 111 mmol/L   CO2 26 22 - 32 mmol/L   Glucose, Bld 118 (H) 65 - 99 mg/dL   BUN 6 6 - 20 mg/dL   Creatinine, Ser 0.83 0.44 - 1.00 mg/dL   Calcium 9.3 8.9 - 10.3 mg/dL    Total Protein 6.6 6.5 - 8.1 g/dL   Albumin 3.6 3.5 - 5.0 g/dL   AST 20 15 - 41 U/L   ALT 20 14 - 54 U/L   Alkaline Phosphatase 60 38 - 126 U/L   Total Bilirubin 1.3 (H) 0.3 - 1.2 mg/dL   GFR calc non Af Amer >60 >60 mL/min   GFR calc Af Amer >60 >60 mL/min   Anion gap 7 5 - 15  CBC  Result Value Ref Range   WBC 6.0 4.0 - 10.5 K/uL   RBC 4.54 3.87 - 5.11 MIL/uL   Hemoglobin 13.3 12.0 - 15.0 g/dL   HCT 42.0 36.0 - 46.0 %   MCV 92.5 78.0 - 100.0 fL   MCH 29.3 26.0 - 34.0 pg   MCHC 31.7 30.0 - 36.0 g/dL   RDW 13.5 11.5 - 15.5 %   Platelets 317 150 - 400 K/uL  Urinalysis, Routine w reflex microscopic  Result Value Ref Range   Color, Urine YELLOW YELLOW   APPearance HAZY (A) CLEAR   Specific Gravity, Urine 1.024 1.005 - 1.030   pH 6.0 5.0 - 8.0   Glucose, UA NEGATIVE NEGATIVE mg/dL   Hgb urine dipstick SMALL (A) NEGATIVE   Bilirubin Urine NEGATIVE NEGATIVE   Ketones, ur NEGATIVE NEGATIVE mg/dL   Protein, ur NEGATIVE NEGATIVE mg/dL   Nitrite NEGATIVE NEGATIVE   Leukocytes, UA NEGATIVE NEGATIVE   RBC / HPF 6-30 0 - 5 RBC/hpf   WBC, UA 0-5 0 - 5 WBC/hpf   Bacteria, UA NONE SEEN NONE SEEN   Squamous Epithelial / LPF 0-5 (A) NONE SEEN   Mucus PRESENT   I-Stat beta hCG blood, ED  Result Value Ref Range   I-stat hCG, quantitative <5.0 <5 mIU/mL   Comment 3           Ct Abdomen Pelvis Wo Contrast  Result Date: 06/02/2017 CLINICAL DATA:  Abdominal pain with nausea and vomiting EXAM: CT ABDOMEN AND PELVIS WITHOUT CONTRAST TECHNIQUE: Multidetector CT imaging of the abdomen and pelvis was performed following the standard protocol without IV contrast. COMPARISON:  11/04/2014 FINDINGS: Lower chest: No acute abnormality. Hepatobiliary: No focal liver abnormality is seen. No gallstones, gallbladder wall thickening, or biliary dilatation. Pancreas: Unremarkable. No pancreatic ductal dilatation or surrounding inflammatory changes. Spleen: Normal in size without focal abnormality.  Adrenals/Urinary Tract: Adrenal glands are unremarkable. Kidneys are normal, without renal calculi, focal lesion, or hydronephrosis. Bladder is unremarkable. Stomach/Bowel: Stomach is within normal limits. Appendix appears normal. No evidence of bowel wall thickening, distention, or inflammatory changes. Vascular/Lymphatic: No significant vascular findings are present. No enlarged abdominal or pelvic lymph nodes. Reproductive: Uterus is unremarkable. Probable 4.3 cm cyst in the right ovary. Other: Negative for free air or free fluid. Midline abdominal scarring as before. Musculoskeletal: No acute or significant osseous findings. IMPRESSION: 1. No CT evidence for acute intra-abdominal or pelvic abnormality. Negative for appendicitis or colon wall thickening. Negative for obstruction. 2. Probable 4.3 cm cyst in the right ovary. 6-12 week sonographic follow-up recommended. Electronically Signed   By: Donavan Foil M.D.   On: 06/02/2017 15:39   EKG  EKG Interpretation None       Radiology No results found.  Procedures Procedures (including critical care time)  Medications Ordered in ED Medications  metoCLOPramide (REGLAN) injection 5 mg (not administered)     Initial Impression / Assessment and Plan / ED Course  I have reviewed the triage vital signs and the nursing notes.  Pertinent labs & imaging results that were available during my care of the patient were reviewed by me and considered in my medical decision making (see chart for details).     Patient declines pain medicine  1:35 PM feels improved after treatment with intravenous Reglan. 4 PM patient resting comfortably.  Plan Prilosec OTC.  Maalox.  I have advised her to follow-up with her oncologist follow-up pelvic ultrasound 6-12 weeks.  She is also advised to follow-up  with her PMD or with Dr. Michail Sermon, her gastroenterologist if symptoms continue after 1-2 weeks. Final Clinical Impressions(s) / ED Diagnoses  Dx#1 epigastric  abdominal pain #2 ovarian cyst Final diagnoses:  None    ED Discharge Orders    None       Orlie Dakin, MD 06/02/17 [phone]

## 2017-06-02 NOTE — ED Triage Notes (Signed)
abd pain for 2 weeks worse since Friday  Last normal bm 3-4 days this is normal  lmp dec 12

## 2017-06-02 NOTE — ED Notes (Signed)
Pt states that she is tolerating contrast poorly--presenting with emesis currently.

## 2017-06-02 NOTE — Discharge Instructions (Signed)
Take Prilosec OTC as directed.  You can also take Maalox 2 tablespoons after meals and at bedtime for stomach discomfort.  Your CT scan of your abdomen today shows a 4.3 cm cyst on your right ovary, which we do not feel is the cause of today's pain.  You should have a pelvic ultrasound to be ordered by your gynecologist within the next 6-12 weeks.  If your stomach pain is not improved after 1 or 2 weeks see Dr.Funches in the office or make an appointment with Dr.Schooler

## 2017-12-14 ENCOUNTER — Observation Stay (HOSPITAL_COMMUNITY)
Admission: EM | Admit: 2017-12-14 | Discharge: 2017-12-16 | Disposition: A | Discharge status: Home / Self Care | Payer: Self-pay | Attending: Family Medicine | Admitting: Family Medicine

## 2017-12-14 ENCOUNTER — Emergency Department (HOSPITAL_COMMUNITY): Payer: Self-pay

## 2017-12-14 ENCOUNTER — Encounter (HOSPITAL_COMMUNITY): Payer: Self-pay | Admitting: Emergency Medicine

## 2017-12-14 DIAGNOSIS — R778 Other specified abnormalities of plasma proteins: Secondary | ICD-10-CM

## 2017-12-14 DIAGNOSIS — F17210 Nicotine dependence, cigarettes, uncomplicated: Secondary | ICD-10-CM | POA: Insufficient documentation

## 2017-12-14 DIAGNOSIS — R0609 Other forms of dyspnea: Principal | ICD-10-CM | POA: Diagnosis present

## 2017-12-14 DIAGNOSIS — Z8249 Family history of ischemic heart disease and other diseases of the circulatory system: Secondary | ICD-10-CM | POA: Insufficient documentation

## 2017-12-14 DIAGNOSIS — G4733 Obstructive sleep apnea (adult) (pediatric): Secondary | ICD-10-CM | POA: Insufficient documentation

## 2017-12-14 DIAGNOSIS — F17200 Nicotine dependence, unspecified, uncomplicated: Secondary | ICD-10-CM | POA: Diagnosis present

## 2017-12-14 DIAGNOSIS — Z791 Long term (current) use of non-steroidal anti-inflammatories (NSAID): Secondary | ICD-10-CM | POA: Insufficient documentation

## 2017-12-14 DIAGNOSIS — Z79899 Other long term (current) drug therapy: Secondary | ICD-10-CM | POA: Insufficient documentation

## 2017-12-14 DIAGNOSIS — R7989 Other specified abnormal findings of blood chemistry: Secondary | ICD-10-CM

## 2017-12-14 DIAGNOSIS — Z6841 Body Mass Index (BMI) 40.0 and over, adult: Secondary | ICD-10-CM | POA: Insufficient documentation

## 2017-12-14 DIAGNOSIS — R079 Chest pain, unspecified: Secondary | ICD-10-CM | POA: Insufficient documentation

## 2017-12-14 DIAGNOSIS — R748 Abnormal levels of other serum enzymes: Secondary | ICD-10-CM | POA: Insufficient documentation

## 2017-12-14 DIAGNOSIS — E669 Obesity, unspecified: Secondary | ICD-10-CM | POA: Insufficient documentation

## 2017-12-14 LAB — BASIC METABOLIC PANEL
Anion gap: 8 (ref 5–15)
BUN: 9 mg/dL (ref 6–20)
CALCIUM: 9 mg/dL (ref 8.9–10.3)
CHLORIDE: 104 mmol/L (ref 98–111)
CO2: 26 mmol/L (ref 22–32)
CREATININE: 0.93 mg/dL (ref 0.44–1.00)
Glucose, Bld: 90 mg/dL (ref 70–99)
Potassium: 3.9 mmol/L (ref 3.5–5.1)
SODIUM: 138 mmol/L (ref 135–145)

## 2017-12-14 LAB — I-STAT TROPONIN, ED: Troponin i, poc: 0.01 ng/mL (ref 0.00–0.08)

## 2017-12-14 LAB — CBC
HCT: 43.4 % (ref 36.0–46.0)
Hemoglobin: 13.2 g/dL (ref 12.0–15.0)
MCH: 28.4 pg (ref 26.0–34.0)
MCHC: 30.4 g/dL (ref 30.0–36.0)
MCV: 93.5 fL (ref 78.0–100.0)
PLATELETS: 319 10*3/uL (ref 150–400)
RBC: 4.64 MIL/uL (ref 3.87–5.11)
RDW: 13 % (ref 11.5–15.5)
WBC: 6.7 10*3/uL (ref 4.0–10.5)

## 2017-12-14 LAB — I-STAT BETA HCG BLOOD, ED (MC, WL, AP ONLY)

## 2017-12-14 MED ORDER — ALBUTEROL SULFATE (2.5 MG/3ML) 0.083% IN NEBU
5.0000 mg | INHALATION_SOLUTION | Freq: Once | RESPIRATORY_TRACT | Status: AC
  Administered 2017-12-14: 5 mg via RESPIRATORY_TRACT
  Filled 2017-12-14: qty 6

## 2017-12-14 NOTE — ED Triage Notes (Signed)
Pt c/o SOB started today, coughing up small amounts of sputum, pt also has some pain and tingling down legs and in hand. Denies CP

## 2017-12-14 NOTE — ED Notes (Signed)
Results reviewed.  No changes in acuity at this time 

## 2017-12-15 ENCOUNTER — Ambulatory Visit (HOSPITAL_BASED_OUTPATIENT_CLINIC_OR_DEPARTMENT_OTHER): Payer: Self-pay

## 2017-12-15 ENCOUNTER — Other Ambulatory Visit: Payer: Self-pay

## 2017-12-15 ENCOUNTER — Encounter (HOSPITAL_COMMUNITY): Payer: Self-pay | Admitting: Internal Medicine

## 2017-12-15 DIAGNOSIS — R0602 Shortness of breath: Secondary | ICD-10-CM

## 2017-12-15 DIAGNOSIS — R748 Abnormal levels of other serum enzymes: Secondary | ICD-10-CM

## 2017-12-15 DIAGNOSIS — R0609 Other forms of dyspnea: Secondary | ICD-10-CM

## 2017-12-15 DIAGNOSIS — R7989 Other specified abnormal findings of blood chemistry: Secondary | ICD-10-CM | POA: Insufficient documentation

## 2017-12-15 DIAGNOSIS — R778 Other specified abnormalities of plasma proteins: Secondary | ICD-10-CM | POA: Insufficient documentation

## 2017-12-15 LAB — CBC
HCT: 41 % (ref 36.0–46.0)
HEMATOCRIT: 38.1 % (ref 36.0–46.0)
HEMOGLOBIN: 12.6 g/dL (ref 12.0–15.0)
Hemoglobin: 11.8 g/dL — ABNORMAL LOW (ref 12.0–15.0)
MCH: 29 pg (ref 26.0–34.0)
MCH: 28.7 pg (ref 26.0–34.0)
MCHC: 30.7 g/dL (ref 30.0–36.0)
MCHC: 31 g/dL (ref 30.0–36.0)
MCV: 94.3 fL (ref 78.0–100.0)
MCV: 92.7 fL (ref 78.0–100.0)
Platelets: 292 10*3/uL (ref 150–400)
Platelets: 267 10*3/uL (ref 150–400)
RBC: 4.35 MIL/uL (ref 3.87–5.11)
RBC: 4.11 MIL/uL (ref 3.87–5.11)
RDW: 13.1 % (ref 11.5–15.5)
RDW: 13 % (ref 11.5–15.5)
WBC: 7.6 10*3/uL (ref 4.0–10.5)
WBC: 7.1 10*3/uL (ref 4.0–10.5)

## 2017-12-15 LAB — BASIC METABOLIC PANEL
Anion gap: 7 (ref 5–15)
BUN: 8 mg/dL (ref 6–20)
CO2: 26 mmol/L (ref 22–32)
Calcium: 8.6 mg/dL — ABNORMAL LOW (ref 8.9–10.3)
Chloride: 107 mmol/L (ref 98–111)
Creatinine, Ser: 0.87 mg/dL (ref 0.44–1.00)
GFR calc Af Amer: >60 mL/min (ref >60)
GLUCOSE: 105 mg/dL — AB (ref 70–99)
POTASSIUM: 3.8 mmol/L (ref 3.5–5.1)
Sodium: 140 mmol/L (ref 135–145)

## 2017-12-15 LAB — TROPONIN I
Troponin I: <0.03 ng/mL (ref <0.03)
Troponin I: <0.03 ng/mL (ref <0.03)

## 2017-12-15 LAB — I-STAT TROPONIN, ED: TROPONIN I, POC: 0.18 ng/mL — AB (ref 0.00–0.08)

## 2017-12-15 LAB — D-DIMER, QUANTITATIVE: D-Dimer, Quant: 0.39 ug/mL-FEU (ref 0.00–0.50)

## 2017-12-15 LAB — ECHOCARDIOGRAM COMPLETE
HEIGHTINCHES: 67 in
WEIGHTICAEL: 4800.74 [oz_av]

## 2017-12-15 LAB — TSH: TSH: 2.649 u[IU]/mL (ref 0.350–4.500)

## 2017-12-15 LAB — HIV ANTIBODY (ROUTINE TESTING W REFLEX): HIV SCREEN 4TH GENERATION: NONREACTIVE

## 2017-12-15 LAB — T4, FREE: Free T4: 0.92 ng/dL (ref 0.82–1.77)

## 2017-12-15 LAB — MRSA PCR SCREENING: MRSA BY PCR: NEGATIVE

## 2017-12-15 MED ORDER — HEPARIN BOLUS VIA INFUSION
4000.0000 [IU] | Freq: Once | INTRAVENOUS | Status: AC
  Administered 2017-12-15: 4000 [IU] via INTRAVENOUS
  Filled 2017-12-15: qty 4000

## 2017-12-15 MED ORDER — ACETAMINOPHEN 325 MG PO TABS
650.0000 mg | ORAL_TABLET | Freq: Four times a day (QID) | ORAL | Status: DC | PRN
  Administered 2017-12-15: 650 mg via ORAL
  Filled 2017-12-15: qty 2

## 2017-12-15 MED ORDER — ONDANSETRON HCL 4 MG/2ML IJ SOLN: 4.0000 mg | Freq: Four times a day (QID) | INTRAMUSCULAR | Status: DC | PRN

## 2017-12-15 MED ORDER — ACETAMINOPHEN 650 MG RE SUPP: 650.0000 mg | Freq: Four times a day (QID) | RECTAL | Status: DC | PRN

## 2017-12-15 MED ORDER — HEPARIN (PORCINE) IN NACL 100-0.45 UNIT/ML-% IJ SOLN
1300.0000 [IU]/h | INTRAMUSCULAR | Status: DC
  Administered 2017-12-15: 1300 [IU]/h via INTRAVENOUS
  Filled 2017-12-15: qty 250

## 2017-12-15 MED ORDER — ASPIRIN EC 81 MG PO TBEC
81.0000 mg | DELAYED_RELEASE_TABLET | Freq: Every day | ORAL | Status: DC
  Administered 2017-12-15 – 2017-12-16 (×2): 81 mg via ORAL
  Filled 2017-12-15 (×2): qty 1

## 2017-12-15 MED ORDER — ONDANSETRON HCL 4 MG PO TABS: 4.0000 mg | ORAL_TABLET | Freq: Four times a day (QID) | ORAL | Status: DC | PRN

## 2017-12-15 MED ORDER — GUAIFENESIN 100 MG/5ML PO SOLN
5.0000 mL | ORAL | Status: DC | PRN
  Administered 2017-12-15 – 2017-12-16 (×3): 100 mg via ORAL
  Filled 2017-12-15 (×3): qty 5

## 2017-12-15 MED ORDER — ASPIRIN 81 MG PO CHEW
324.0000 mg | CHEWABLE_TABLET | Freq: Once | ORAL | Status: AC
  Administered 2017-12-15: 324 mg via ORAL
  Filled 2017-12-15: qty 4
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
## 2017-12-15 NOTE — ED Notes (Signed)
RN Suezanne Jacquet and Dr Randal Buba informed of troponin .[place]

## 2017-12-15 NOTE — ED Provider Notes (Signed)
[facility] EMERGENCY DEPARTMENT Provider Note   [name]: [id number] Arrival date & time: 12/14/17  2119     History   Chief Complaint Chief Complaint  Patient presents with  . Shortness of Breath    HPI [name] is a 42 y.o. female.  The history is provided by the patient and medical records.    42 y.o. F with hx of cervical dysplasia, migraine headaches, obesity, with exertional SOB for the past 3 days.  Patient states for the past 2 weeks she has been having productive cough that looks like "jelly".  He states it is somewhat gray in color but denies any hemoptysis.  States the other day she try to go to the baseball game and was started noticing a lot more trouble breathing and states by the time she got her sleep she was "exhausted".  States this is not normal for her.  States since this time she has noticed a lot more generalized fatigue with very little to no energy.  States she is just tired all the time and doesn't know why.  She feels like she is getting adequate sleep.  Past Medical History:  Diagnosis Date  . Cervical dysplasia 1993   . Migraine   . Obesity     Patient Active Problem List   Diagnosis Date Noted  . Mild obstructive sleep apnea 02/14/2015  . Snoring 11/14/2014  . Inguinal adenopathy 10/31/2014  . Positive D dimer 10/17/2014  . Pedal edema 10/14/2014  . Fatigue 10/14/2014  . Severe obesity (BMI >= 40) ([place]) 08/07/2006  . TOBACCO DEPENDENCE 08/07/2006  . MIGRAINE, UNSPEC., W/O INTRACTABLE MIGRAINE 08/07/2006  . RHINITIS, ALLERGIC 08/07/2006    Past Surgical History:  Procedure Laterality Date  . CESAREAN SECTION     x 4  . COLPOSCOPY    . TUBAL LIGATION Bilateral 2006  . UMBILICAL HERNIA REPAIR  2007     OB History    Gravida  6   Para  4   Term  4   Preterm      AB  2   Living  4     SAB  1   TAB  1   Ectopic      Multiple      Live Births  4            Home Medications    Prior to  Admission medications   Medication Sig Start Date End Date Taking? Authorizing Provider  aspirin EC 325 MG tablet Take 1 tablet (325 mg total) by mouth daily. Patient not taking: Reported on 01/19/2017 10/17/14   Boykin Nearing, MD  buPROPion [facility] SR) 150 MG 12 hr tablet Take 1 tablet (150 mg total) by mouth 2 (two) times daily. Patient not taking: Reported on 01/19/2017 10/14/14   Boykin Nearing, MD  HYDROcodone-acetaminophen (NORCO/VICODIN) 5-325 MG tablet Take 1-2 tablets by mouth every 4 (four) hours as needed. Patient not taking: Reported on 06/02/2017 01/19/17   Charlann Lange, PA-C  Misc Natural Products (OSTEO BI-FLEX JOINT SHIELD PO) Take 1 tablet by mouth daily.    [provider]  Multiple Vitamin (MULTIVITAMIN WITH MINERALS) TABS tablet Take 1 tablet by mouth daily.    [provider]  naproxen sodium (ANAPROX) 220 MG tablet Take 220 mg by mouth 2 (two) times daily with a meal.    [provider]  TURMERIC PO Take 1 tablet by mouth daily.    [provider]  Family History Family History  Problem Relation Age of Onset  . Ovarian cancer Mother 25  . Heart failure Mother   . Depression Mother   . Cancer Mother        uterine cancer   . Heart failure Father 25       MI  . Diabetes Father        type 2  . Hypertension Father   . Heart disease Sister     Social History Social History   Tobacco Use  . Smoking status: Current Every Day Smoker    Packs/day: 0.50  . Smokeless tobacco: Never Used  Substance Use Topics  . Alcohol use: No    Alcohol/week: 0.0 oz  . Drug use: No     Allergies   Contrast media [iodinated diagnostic agents] and Morphine and related   Review of Systems Review of Systems  Respiratory: Positive for cough and shortness of breath.   All other systems reviewed and are negative.    Physical Exam Updated Vital Signs BP 110/70 (BP Location: Right Arm)   Pulse 87   Temp 98.8 F (37.1 C) (Oral)    Resp (!) 22   Ht 5\' 7"  (1.702 m)   Wt 136.1 kg (300 lb)   SpO2 100%   BMI 46.99 kg/m   Physical Exam  Constitutional: She is oriented to person, place, and time. She appears well-developed and well-nourished.  obese  HENT:  Head: Normocephalic and atraumatic.  Mouth/Throat: Oropharynx is clear and moist.  Eyes: Pupils are equal, round, and reactive to light. Conjunctivae and EOM are normal.  Neck: Normal range of motion.  Cardiovascular: Normal rate, regular rhythm and normal heart sounds.  Pulmonary/Chest: Effort normal and breath sounds normal. She has no decreased breath sounds. She has no wheezes. She has no rales.  Abdominal: Soft. Bowel sounds are normal. There is no tenderness. There is no rigidity and no guarding.  Musculoskeletal: Normal range of motion.  No significant peripheral edema, DP pulses intact bilaterally; no calf tenderness, asymmetry, tenderness, or palpable cords  Neurological: She is alert and oriented to person, place, and time.  Skin: Skin is warm and dry.  Psychiatric: She has a normal mood and affect.  Nursing note and vitals reviewed.    ED Treatments / Results  Labs (all labs ordered are listed, but only abnormal results are displayed) Labs Reviewed  I-STAT TROPONIN, ED - Abnormal; Notable for the following components:      Result Value   Troponin i, poc 0.18 (*)    All other components within normal limits  BASIC METABOLIC PANEL  CBC  D-DIMER, QUANTITATIVE (NOT AT [facility])  CBC  TROPONIN I  TSH  T4, FREE  HEPARIN LEVEL (UNFRACTIONATED)  HIV ANTIBODY (ROUTINE TESTING)  BASIC METABOLIC PANEL  CBC  TROPONIN I  TROPONIN I  TROPONIN I  I-STAT TROPONIN, ED  I-STAT BETA HCG BLOOD, ED ([facility], [facility], [facility] ONLY)    EKG EKG Interpretation  Date/Time:  Sunday December 14 2017 21:31:20 EDT Ventricular Rate:  88 PR Interval:  136 QRS Duration: 84 QT Interval:  374 QTC Calculation: 452 R Axis:   81 Text Interpretation:  Normal sinus rhythm Confirmed  by Randal Buba, April ([id number]) on 12/15/2017 12:38:32 AM   Radiology Dg Chest 2 View  Result Date: 12/14/2017 CLINICAL DATA:  Chest pain EXAM: CHEST - 2 VIEW COMPARISON:  08/25/2014 FINDINGS: Normal heart size and mediastinal contours. No acute infiltrate or edema. No effusion or pneumothorax. No acute  osseous findings. IMPRESSION: Negative chest. Electronically Signed   By: Monte Fantasia M.D.   On: 12/14/2017 22:08    Procedures Procedures (including critical care time)  CRITICAL CARE Performed by: Larene Pickett   Total critical care time: 45 minutes  Critical care time was exclusive of separately billable procedures and treating other patients.  Critical care was necessary to treat or prevent imminent or life-threatening deterioration.  Critical care was time spent personally by me on the following activities: development of treatment plan with patient and/or surrogate as well as nursing, discussions with consultants, evaluation of patient's response to treatment, examination of patient, obtaining history from patient or surrogate, ordering and performing treatments and interventions, ordering and review of laboratory studies, ordering and review of radiographic studies, pulse oximetry and re-evaluation of patient's condition.   Medications Ordered in ED Medications  albuterol (PROVENTIL) (2.5 MG/3ML) 0.083% nebulizer solution 5 mg (5 mg Nebulization Given 12/14/17 2134)  aspirin chewable tablet 324 mg (324 mg Oral Given 12/15/17 0156)     Initial Impression / Assessment and Plan / ED Course  I have reviewed the triage vital signs and the nursing notes.  Pertinent labs & imaging results that were available during my care of the patient were reviewed by me and considered in my medical decision making (see chart for details).  43 y.o. female here with 3 days of DOE.  States she is starting to feel fatigued with very little to no energy.  She has not had any syncopal events.  She denies  any chest pain.  Initial EKG and labs overall reassuring.  Chest x-ray is clear.  She does not appear significantly fluid overloaded.  D-dimer was added and is negative.  Delta troponin is now positive at 0.18.  Patient continues to deny any chest pain.  Repeat EKG remains nonischemic.  Given full dose aspirin.  Will discuss with cardiology.  Given her fatigue, TSH and T4 added.  Discussed with cardiology, Dr. Lamona Curl-- feels appropriate for hospital admission.  He will see in consult.  Recommends to start heparin.  Discussed with Dr. Hal Lilliane-- will admit for ongoing care.  Final Clinical Impressions(s) / ED Diagnoses   Final diagnoses:  Chest pain in adult  Elevated troponin    ED Discharge Orders    None       Larene Pickett, PA-C 12/15/17 0356    Palumbo, April, MD 12/15/17 [id number]

## 2017-12-15 NOTE — ED Notes (Signed)
Nurse drawing labs. 

## 2017-12-15 NOTE — Progress Notes (Signed)
RT set up CPAP and placed on patient. Patient is tolerating well at this time. RT will monitor as needed. 

## 2017-12-15 NOTE — Progress Notes (Signed)
ANTICOAGULATION CONSULT NOTE - Initial Consult  Pharmacy Consult for heparin Indication: chest pain/ACS  Allergies  Allergen Reactions  . Contrast Media [Iodinated Diagnostic Agents] Other (See Comments)    Pt became hypotensive,lightheaded,near syncopal the last 2 times she had contrast( prior to 2007)////////NEW UPDATE, COUGHING, TROUBLE BREATHING AND HIVES PER PT//A.CALHOUN  . Morphine And Related Itching    Patient Measurements: Height: 5\' 7"  (170.2 cm) Weight: 300 lb (136.1 kg) IBW/kg (Calculated) : 61.6 Heparin Dosing Weight: 95kg  Vital Signs: Temp: 98.8 F (37.1 C) (07/07 2127) Temp Source: Oral (07/07 2127) BP: 111/65 (07/08 0200) Pulse Rate: 72 (07/08 0200)  Labs: Recent Labs    12/14/17 2143  HGB 13.2  HCT 43.4  PLT 319  CREATININE 0.93    Estimated Creatinine Clearance: 112.5 mL/min (by C-G formula based on SCr of 0.93 mg/dL).  Assessment: 26 YOF with elevated troponins to start heparin for ACS.  She is not on anticoagulation PTA. Baseline CBC wnl, no bleeding noted.  Goal of Therapy:  Heparin level 0.3-0.7 units/ml Monitor platelets by anticoagulation protocol: Yes   Plan:  Heparin bolus 4000 units IV x1, then start infusion at 1300 units/hr Heparin level in 6 hours Daily heparin level and CBC  Ocie Tino D. Yandriel Boening, PharmD, BCPS Clinical Pharmacist [phone] Please check AMION for all [place] numbers 12/15/2017 2:20 AM

## 2017-12-15 NOTE — Progress Notes (Signed)
  Echocardiogram 2D Echocardiogram has been performed.  [name] 12/15/2017, 2:50 PM

## 2017-12-15 NOTE — Progress Notes (Signed)
Progress Note  Patient Name: [name] Date of Encounter: 12/15/2017  Primary Cardiologist:  [place] N[place]ser   Subjective   42 year old female who was admitted to the hospital yesterday with progressive shortness of breath over the past several weeks.  She smokes 1/2 pack cigarettes a day.  She has a family history of cardiac disease including congestive heart failure and coronary artery disease.  In the emergency room she was found to have a point-of-care troponin of 0.18.  Subsequent regular troponin levels have been negative.  Inpatient Medications    Scheduled Meds: . aspirin EC  81 mg Oral Daily   Continuous Infusions: . heparin 1,300 Units/hr (12/15/17 0432)   PRN Meds: acetaminophen **OR** acetaminophen, ondansetron **OR** ondansetron (ZOFRAN) IV   Vital Signs    Vitals:   12/15/17 0315 12/15/17 0432 12/15/17 0507 12/15/17 0722  BP: 117/74 (!) 109/95  (!) 101/52  Pulse: 68 82  70  Resp: 13 (!) 23  20  Temp:  98.9 F (37.2 C)    TempSrc:  Oral  Oral  SpO2: 100%     Weight:   (!) 300 lb 0.7 oz (136.1 kg)   Height:   5\' 7"  (1.702 m)     Intake/Output Summary (Last 24 hours) at 12/15/2017 0827 Last data filed at 12/15/2017 0531 Gross per 24 hour  Intake 56.7 ml  Output 300 ml  Net -243.3 ml   Filed Weights   12/14/17 2130 12/15/17 0507  Weight: 300 lb (136.1 kg) (!) 300 lb 0.7 oz (136.1 kg)    Telemetry    NSR  - Personally Reviewed  ECG     NSR at 68.  No ST or T wave changes.  - Personally Reviewed  Physical Exam   GEN: morbidly obese , young female, NAD  Neck: No JVD Cardiac: RRR, no murmurs, rubs, or gallops.  Respiratory: tight wheezing, especially with cough  GI: Soft, nontender, non-distended  MS: No edema; No deformity. Neuro:  Nonfocal  Psych: Normal affect   Labs    Chemistry Recent Labs  Lab 12/14/17 2143 12/15/17 0710  NA 138 140  K 3.9 3.8  CL 104 107  CO2 26 26  GLUCOSE 90 105*  BUN 9 8  CREATININE 0.93 0.87  CALCIUM  9.0 8.6*  GFRNONAA >60 >60  GFRAA >60 >60  ANIONGAP 8 7     Hematology Recent Labs  Lab 12/14/17 2143 12/15/17 0226 12/15/17 0710  WBC 6.7 7.6 7.1  RBC 4.64 4.35 4.11  HGB 13.2 12.6 11.8*  HCT 43.4 41.0 38.1  MCV 93.5 94.3 92.7  MCH 28.4 29.0 28.7  MCHC 30.4 30.7 31.0  RDW 13.0 13.1 13.0  PLT 319 292 267    Cardiac Enzymes Recent Labs  Lab 12/15/17 0226 12/15/17 0710  TROPONINI <0.03 <0.03    Recent Labs  Lab 12/14/17 2146 12/15/17 0121  TROPIPOC 0.01 0.18*     BNPNo results for input(s): BNP, PROBNP in the last 168 hours.   DDimer  Recent Labs  Lab 12/15/17 0111  DDIMER 0.39     Radiology    Dg Chest 2 View  Result Date: 12/14/2017 CLINICAL DATA:  Chest pain EXAM: CHEST - 2 VIEW COMPARISON:  08/25/2014 FINDINGS: Normal heart size and mediastinal contours. No acute infiltrate or edema. No effusion or pneumothorax. No acute osseous findings. IMPRESSION: Negative chest. Electronically Signed   By: Monte Fantasia M.D.   On: 12/14/2017 22:08    Cardiac Studies  Patient Profile     42 y.o. female admitted with increased shortness of breath, cough, and wheezing.  Point-of-care troponin was noted to be mildly elevated but subsequent troponin I levels have all been negative.  Assessment & Plan    1.  Shortness of breath: The patient has tight wheezing and is coughing up clear/gray sputum.  She denies any chest pain.  She denies any symptoms specific for congestive heart failure.  She does not have any leg edema. This looks to be more of a pulmonary issue.  There is no indication of a cardiac abnormality.  She does need to exercise more and to lose weight.  None of her symptoms sound cardiac.  Will allow her to eat.   Get an echo Encouraged her to stop smoking  Will follow up tomorrow after the echo    For questions or updates, please contact [facility] Please consult [email or web address] for contact info under Cardiology/STEMI.     Signed, Mertie Moores, MD  12/15/2017, 8:27 AM

## 2017-12-15 NOTE — H&P (Signed)
History and Physical    [name] WIO:[id number] DOB: January 09, 1976 DOA: 12/14/2017  PCP: Boykin Nearing, MD   Patient coming from: Home.  Chief Complaint: Shortness of breath.  HPI: [name] is a 42 y.o. female with history of tobacco abuse presents to the ER with complaints of shortness of breath.  Patient states over the last 2 weeks patient has been experiencing exertional shortness of breath at times paroxysmal nocturnal dyspnea with cough productive of white sputum for last 2 weeks.  Denies any chest pain or any nausea vomiting abdominal pain diarrhea fever chills.  Patient states she also is getting intensity fatigue with the symptoms.  ED Course: In the ER patient is chest pain-free chest x-ray was unremarkable.  Point-of-care troponin turned out to be mildly positive cardiology was consulted and they have requested an admission for further observation.  Patient was started on heparin as per the cardiology request.  Review of Systems: As per HPI, rest all negative.   Past Medical History:  Diagnosis Date  . Cervical dysplasia 1993   . Migraine   . Obesity     Past Surgical History:  Procedure Laterality Date  . CESAREAN SECTION     x 4  . COLPOSCOPY    . TUBAL LIGATION Bilateral 2006  . UMBILICAL HERNIA REPAIR  2007     reports that she has been smoking.  She has been smoking about 0.50 packs per day. She has never used smokeless tobacco. She reports that she does not drink alcohol or use drugs.  Allergies  Allergen Reactions  . Contrast Media [Iodinated Diagnostic Agents] Other (See Comments)    Pt became hypotensive,lightheaded,near syncopal the last 2 times she had contrast( prior to 2007)////////NEW UPDATE, COUGHING, TROUBLE BREATHING AND HIVES PER PT//A.CALHOUN  . Morphine And Related Itching    Family History  Problem Relation Age of Onset  . Ovarian cancer Mother 58  . Heart failure Mother   . Depression Mother   . Cancer Mother        uterine  cancer   . Heart failure Father 30       MI  . Diabetes Father        type 2  . Hypertension Father   . Heart disease Sister     Prior to Admission medications   Medication Sig Start Date End Date Taking? Authorizing Provider  aspirin EC 325 MG tablet Take 1 tablet (325 mg total) by mouth daily. Patient not taking: Reported on 01/19/2017 10/17/14   Boykin Nearing, MD  buPROPion [facility] SR) 150 MG 12 hr tablet Take 1 tablet (150 mg total) by mouth 2 (two) times daily. Patient not taking: Reported on 01/19/2017 10/14/14   Boykin Nearing, MD  HYDROcodone-acetaminophen (NORCO/VICODIN) 5-325 MG tablet Take 1-2 tablets by mouth every 4 (four) hours as needed. Patient not taking: Reported on 06/02/2017 01/19/17   Charlann Lange, PA-C  Misc Natural Products (OSTEO BI-FLEX JOINT SHIELD PO) Take 1 tablet by mouth daily.    [provider]  Multiple Vitamin (MULTIVITAMIN WITH MINERALS) TABS tablet Take 1 tablet by mouth daily.    [provider]  naproxen sodium (ANAPROX) 220 MG tablet Take 220 mg by mouth 2 (two) times daily with a meal.    [provider]  TURMERIC PO Take 1 tablet by mouth daily.    [provider]    Physical Exam: Vitals:   12/15/17 0230 12/15/17 0245 12/15/17 0300 12/15/17 0315  BP: 109/72  101/70 (!) 107/50 117/74  Pulse: 75 71 74 68  Resp: 19 20 (!) 21 13  Temp:      TempSrc:      SpO2: 100% 100% 100% 100%  Weight:      Height:          Constitutional: Moderately built and nourished. Vitals:   12/15/17 0230 12/15/17 0245 12/15/17 0300 12/15/17 0315  BP: 109/72 101/70 (!) 107/50 117/74  Pulse: 75 71 74 68  Resp: 19 20 (!) 21 13  Temp:      TempSrc:      SpO2: 100% 100% 100% 100%  Weight:      Height:       Eyes: Anicteric no pallor. ENMT: No discharge from the ears eyes nose or mouth. Neck: No mass felt.  No neck rigidity.  No JVD appreciated. Respiratory: No rhonchi or crepitations. Cardiovascular: S1-S2 heard no  murmurs appreciated. Abdomen: Soft nontender bowel sounds present. Musculoskeletal: No edema.  No joint effusion. Skin: No rash.  Skin appears warm. Neurologic: Alert awake oriented to time place and person.  Moves all extremities. Psychiatric: Appears normal per normal affect.   Labs on Admission: I have personally reviewed following labs and imaging studies  CBC: Recent Labs  Lab 12/14/17 2143 12/15/17 0226  WBC 6.7 7.6  HGB 13.2 12.6  HCT 43.4 41.0  MCV 93.5 94.3  PLT 319 009   Basic Metabolic Panel: Recent Labs  Lab 12/14/17 2143  NA 138  K 3.9  CL 104  CO2 26  GLUCOSE 90  BUN 9  CREATININE 0.93  CALCIUM 9.0   GFR: Estimated Creatinine Clearance: 112.5 mL/min (by C-G formula based on SCr of 0.93 mg/dL). Liver Function Tests: No results for input(s): AST, ALT, ALKPHOS, BILITOT, PROT, ALBUMIN in the last 168 hours. No results for input(s): LIPASE, AMYLASE in the last 168 hours. No results for input(s): AMMONIA in the last 168 hours. Coagulation Profile: No results for input(s): INR, PROTIME in the last 168 hours. Cardiac Enzymes: No results for input(s): CKTOTAL, CKMB, CKMBINDEX, TROPONINI in the last 168 hours. BNP (last 3 results) No results for input(s): PROBNP in the last 8760 hours. HbA1C: No results for input(s): HGBA1C in the last 72 hours. CBG: No results for input(s): GLUCAP in the last 168 hours. Lipid Profile: No results for input(s): CHOL, HDL, LDLCALC, TRIG, CHOLHDL, LDLDIRECT in the last 72 hours. Thyroid Function Tests: No results for input(s): TSH, T4TOTAL, FREET4, T3FREE, THYROIDAB in the last 72 hours. Anemia Panel: No results for input(s): VITAMINB12, FOLATE, FERRITIN, TIBC, IRON, RETICCTPCT in the last 72 hours. Urine analysis:    Component Value Date/Time   COLORURINE YELLOW 06/02/2017 0703   APPEARANCEUR HAZY (A) 06/02/2017 0703   LABSPEC 1.024 06/02/2017 0703   PHURINE 6.0 06/02/2017 0703   GLUCOSEU NEGATIVE 06/02/2017 0703    HGBUR SMALL (A) 06/02/2017 0703   HGBUR large 08/24/2007 1336   BILIRUBINUR NEGATIVE 06/02/2017 0703   KETONESUR NEGATIVE 06/02/2017 0703   PROTEINUR NEGATIVE 06/02/2017 0703   UROBILINOGEN 1.0 04/11/2014 2041   NITRITE NEGATIVE 06/02/2017 0703   LEUKOCYTESUR NEGATIVE 06/02/2017 0703   Sepsis Labs: @LABRCNTIP (procalcitonin:4,lacticidven:4) )No results found for this or any previous visit (from the past 240 hour(s)).   Radiological Exams on Admission: Dg Chest 2 View  Result Date: 12/14/2017 CLINICAL DATA:  Chest pain EXAM: CHEST - 2 VIEW COMPARISON:  08/25/2014 FINDINGS: Normal heart size and mediastinal contours. No acute infiltrate or edema. No effusion or pneumothorax. No acute  osseous findings. IMPRESSION: Negative chest. Electronically Signed   By: Monte Fantasia M.D.   On: 12/14/2017 22:08    EKG: Independently reviewed.  Normal sinus rhythm.  Assessment/Plan Principal Problem:   Exertional dyspnea Active Problems:   TOBACCO DEPENDENCE    1. Exertional dyspnea with fatigue and positive troponin -appreciate cardiology consult.  At this time plan is to cycle cardiac markers continue heparin infusion check 2D echo aspirin.  D-dimer was negative. 2. Fatigue -check TSH follow 2D echo. 3. Tobacco abuse -patient advised to quit smoking.   DVT prophylaxis: Heparin infusion. Code Status: Full code. Family Communication: Discussed with patient. Disposition Plan: Home. Consults called: Cardiology. Admission status: Observation.   Rise Patience MD [facility] Pager [phone].  If 7PM-7AM, please contact night-coverage [email or web address] Password [email or web address]  12/15/2017, 3:31 AM

## 2017-12-15 NOTE — Consult Note (Signed)
Reason for Consult: + trop, SOB/fatigue   Requesting Physician/Service: ED Palumbo   PCP:  Pamela Nearing, MD Primary Cardiologist:N/A  HPI:  This is a 42 year old female with history of obesity, tobacco abuse, family history of CAD and CHF presenting with several day progressive shortness of breath/fatigue.  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  She has 4 children and noticed that she has had worsening exercise tolerance and increased fatigue.  When walking recently with her children she had to stop several times due to fatigue and shortness of breath.  Over this time.,  She denies chest pain.  She denies any difference in lower extremity swelling.  She does have a cough productive of which she calls gray phlegm.  Currently smokes about half pack a day.  Father had MI and congestive heart failure as did many of her aunts and uncles.  No known CAD.  She will troponin normal.  D-dimer normal.  Second troponin elevated at 0.18.  ECG sinus rhythm without ischemic changes.    Past Medical History:  Diagnosis Date  . Cervical dysplasia 1993   . Migraine   . Obesity     Past Surgical History:  Procedure Laterality Date  . CESAREAN SECTION     x 4  . COLPOSCOPY    . TUBAL LIGATION Bilateral 2006  . UMBILICAL HERNIA REPAIR  2007    Family History  Problem Relation Age of Onset  . Ovarian cancer Mother 46  . Heart failure Mother   . Depression Mother   . Cancer Mother        uterine cancer   . Heart failure Father 51       MI  . Diabetes Father        type 2  . Hypertension Father   . Heart disease Sister    Social History:  reports that she has been smoking.  She has been smoking about 0.50 packs per day. She has never used smokeless tobacco. She reports that she does not drink alcohol or use drugs.  Allergies:  Allergies  Allergen Reactions  . Contrast Media [Iodinated Diagnostic Agents] Other (See Comments)    Pt became hypotensive,lightheaded,near  syncopal the last 2 times she had contrast( prior to 2007)////////NEW UPDATE, COUGHING, TROUBLE BREATHING AND HIVES PER PT//[name]  . Morphine And Related Itching    No current facility-administered medications on file prior to encounter.    Current Outpatient Medications on File Prior to Encounter  Medication Sig Dispense Refill  . aspirin EC 325 MG tablet Take 1 tablet (325 mg total) by mouth daily. (Patient not taking: Reported on 01/19/2017) 30 tablet 0  . buPROPion (WELLBUTRIN SR) 150 MG 12 hr tablet Take 1 tablet (150 mg total) by mouth 2 (two) times daily. (Patient not taking: Reported on 01/19/2017) 60 tablet 5  . HYDROcodone-acetaminophen (NORCO/VICODIN) 5-325 MG tablet Take 1-2 tablets by mouth every 4 (four) hours as needed. (Patient not taking: Reported on 06/02/2017) 6 tablet 0  . Misc Natural Products (OSTEO BI-FLEX JOINT SHIELD PO) Take 1 tablet by mouth daily.    . Multiple Vitamin (MULTIVITAMIN WITH MINERALS) TABS tablet Take 1 tablet by mouth daily.    . naproxen sodium (ANAPROX) 220 MG tablet Take 220 mg by mouth 2 (two) times daily with a meal.    . TURMERIC PO Take 1 tablet by mouth daily.      @medshecduled @ @medinfusions @  Results for orders placed or performed during the  hospital encounter of 12/14/17 (from the past 48 hour(s))  Basic metabolic panel     Status: None   Collection Time: 12/14/17  9:43 PM  Result Value Ref Range   Sodium 138 135 - 145 mmol/L   Potassium 3.9 3.5 - 5.1 mmol/L   Chloride 104 98 - 111 mmol/L    Comment: Please note change in reference range.   CO2 26 22 - 32 mmol/L   Glucose, Bld 90 70 - 99 mg/dL    Comment: Please note change in reference range.   BUN 9 6 - 20 mg/dL    Comment: Please note change in reference range.   Creatinine, Ser 0.93 0.44 - 1.00 mg/dL   Calcium 9.0 8.9 - 10.3 mg/dL   GFR calc non Af Amer >60 >60 mL/min   GFR calc Af Amer >60 >60 mL/min    Comment: (NOTE) The eGFR has been calculated using the CKD EPI  equation. This calculation has not been validated in all clinical situations. eGFR's persistently <60 mL/min signify possible Chronic Kidney Disease.    Anion gap 8 5 - 15    Comment: Performed at [facility] [address] [place], [place] [address]  CBC     Status: None   Collection Time: 12/14/17  9:43 PM  Result Value Ref Range   WBC 6.7 4.0 - 10.5 K/uL   RBC 4.64 3.87 - 5.11 MIL/uL   Hemoglobin 13.2 12.0 - 15.0 g/dL   HCT 43.4 36.0 - 46.0 %   MCV 93.5 78.0 - 100.0 fL   MCH 28.4 26.0 - 34.0 pg   MCHC 30.4 30.0 - 36.0 g/dL   RDW 13.0 11.5 - 15.5 %   Platelets 319 150 - 400 K/uL    Comment: Performed at [facility] [address], [place], [place] [address]  I-stat troponin, ED     Status: None   Collection Time: 12/14/17  9:46 PM  Result Value Ref Range   Troponin i, poc 0.01 0.00 - 0.08 ng/mL   Comment 3            Comment: Due to the release kinetics of cTnI, a negative result within the first hours of the onset of symptoms does not rule out myocardial infarction with certainty. If myocardial infarction is still suspected, repeat the test at appropriate intervals.   I-Stat beta hCG blood, ED     Status: None   Collection Time: 12/14/17  9:46 PM  Result Value Ref Range   I-stat hCG, quantitative <5.0 <5 mIU/mL   Comment 3            Comment:   GEST. AGE      CONC.  (mIU/mL)   <=1 WEEK        5 - 50     2 WEEKS       50 - 500     3 WEEKS       100 - 10,000     4 WEEKS     1,000 - 30,000        FEMALE AND NON-PREGNANT FEMALE:     LESS THAN 5 mIU/mL   D-dimer, quantitative (not at [facility])     Status: None   Collection Time: 12/15/17  1:11 AM  Result Value Ref Range   D-Dimer, Quant 0.39 0.00 - 0.50 ug/mL-FEU    Comment: (NOTE) At the manufacturer cut-off of 0.50 ug/mL FEU, this assay has been documented to exclude PE  with a sensitivity and negative predictive value of 97 to 99%.  At this time, this assay has not been approved by the FDA to exclude  DVT/VTE. Results should be correlated with clinical presentation. Performed at [facility], [place] [address] [place], [place] [address]   I-stat troponin, ED     Status: Abnormal   Collection Time: 12/15/17  1:21 AM  Result Value Ref Range   Troponin i, poc 0.18 (HH) 0.00 - 0.08 ng/mL   Comment NOTIFIED PHYSICIAN    Comment 3            Comment: Due to the release kinetics of cTnI, a negative result within the first hours of the onset of symptoms does not rule out myocardial infarction with certainty. If myocardial infarction is still suspected, repeat the test at appropriate intervals.    Dg Chest 2 View  Result Date: 12/14/2017 CLINICAL DATA:  Chest pain EXAM: CHEST - 2 VIEW COMPARISON:  08/25/2014 FINDINGS: Normal heart size and mediastinal contours. No acute infiltrate or edema. No effusion or pneumothorax. No acute osseous findings. IMPRESSION: Negative chest. Electronically Signed   By: Monte Fantasia M.D.   On: 12/14/2017 22:08    ECG/TELE: Sinus rhythm without ischemic changes  ROS: As above. Otherwise, review of systems is negative unless per above HPI  Vitals:   12/14/17 2130 12/15/17 0130 12/15/17 0146 12/15/17 0200  BP:  (!) 93/52 (!) 91/57 111/65  Pulse:  76 84 72  Resp:  19 14 14   Temp:      TempSrc:      SpO2:  100% 100% 100%  Weight: 136.1 kg (300 lb)     Height: 5' 7"  (1.702 m)      Wt Readings from Last 10 Encounters:  12/14/17 136.1 kg (300 lb)  06/02/17 136.1 kg (300 lb)  01/19/17 (!) 145.2 kg (320 lb)  02/03/15 (!) 144.2 kg (318 lb)  11/14/14 (!) 147 kg (324 lb)  10/14/14 (!) 146.1 kg (322 lb)  08/26/14 (!) 147.2 kg (324 lb 8 oz)  08/22/14 (!) 149.7 kg (330 lb)  08/09/14 (!) 152.1 kg (335 lb 6.4 oz)  07/27/14 (!) 152 kg (335 lb)    PE:  General: No acute distress HEENT: Atraumatic, EOMI, mucous membranes moist. No JVD at 45 degrees. No HJR. CV: RRR no murmurs, gallops.  Respiratory: Clear, no crackles. Normal work of breathing ABD:  Obese and non-tender. No palpable organomegaly.  Extremities: 2+ radial pulses bilaterally. 0 edema. Neuro/Psych: CN grossly intact, alert and oriented  Assessment/Plan + Trop, SOB Tobacco abuse  Unclear etiology, differential includes SCAD, CAD/ACS, developing cardiomyopathy.  Has had exertional shortness of breath/fatigue which has been progressive. Chest Pain.  D-dimer was negative, second troponin elevated at 0.18.  ECG appears benign.  Agree with observation admission, cycling troponin, TTE, heparin if no obvious bleeding risks, tobacco cessation counseling.  Recs: - ASA 81 mg - Heparin ACS nomogram - TTE  We will follow along  [name] Pamela Boatman  MD 12/15/2017, 2:29 AM

## 2017-12-16 DIAGNOSIS — F17200 Nicotine dependence, unspecified, uncomplicated: Secondary | ICD-10-CM

## 2017-12-16 DIAGNOSIS — R0609 Other forms of dyspnea: Secondary | ICD-10-CM

## 2017-12-16 LAB — CBC
HCT: 38 % (ref 36.0–46.0)
Hemoglobin: 11.8 g/dL — ABNORMAL LOW (ref 12.0–15.0)
MCH: 28.9 pg (ref 26.0–34.0)
MCHC: 31.1 g/dL (ref 30.0–36.0)
MCV: 92.9 fL (ref 78.0–100.0)
PLATELETS: 282 10*3/uL (ref 150–400)
RBC: 4.09 MIL/uL (ref 3.87–5.11)
RDW: 13.1 % (ref 11.5–15.5)
WBC: 5.4 10*3/uL (ref 4.0–10.5)

## 2017-12-16 MED ORDER — GUAIFENESIN 100 MG/5ML PO SOLN: 5.0000 mL | ORAL | 0 refills | Status: DC | PRN

## 2017-12-16 MED ORDER — ACETAMINOPHEN 325 MG PO TABS: 650.0000 mg | ORAL_TABLET | Freq: Four times a day (QID) | ORAL | Status: DC | PRN

## 2017-12-16 NOTE — Progress Notes (Signed)
Md text paged regarding discharge DME. Case management unable to secure CPAP at discharge. Appointment set with PCP for the same.

## 2017-12-16 NOTE — Progress Notes (Signed)
Progress Note  Patient Name: [name] Date of Encounter: 12/16/2017  Primary Cardiologist:  New Nicole Defino   Subjective   42 year old female who was admitted to the hospital yesterday with progressive shortness of breath over the past several weeks.  She smokes 1/2 pack cigarettes a day.  She has a family history of cardiac disease including congestive heart failure and coronary artery disease.  In the emergency room she was found to have a point-of-care troponin of 0.18.  Subsequent regular troponin levels have been negative.  Her symptoms have been primarily pulmonary.  She is having lots of wheezing.  Echocardiogram from yesterday shows normal left ventricular function and normal valvular function.  Inpatient Medications    Scheduled Meds: . aspirin EC  81 mg Oral Daily   Continuous Infusions:  PRN Meds: acetaminophen **OR** acetaminophen, guaiFENesin, ondansetron **OR** ondansetron (ZOFRAN) IV   Vital Signs    Vitals:   12/15/17 2200 12/15/17 2329 12/16/17 0343 12/16/17 0705  BP:  (!) 101/50 (!) 104/58 95/65  Pulse: 66 72 66 64  Resp: 20 14 13    Temp:  98 F (36.7 C) 98 F (36.7 C) 98.2 F (36.8 C)  TempSrc:  Oral Oral Oral  SpO2: 100% 99% 97%   Weight:      Height:        Intake/Output Summary (Last 24 hours) at 12/16/2017 1025 Last data filed at 12/15/2017 1310 Gross per 24 hour  Intake 0 ml  Output -  Net 0 ml   Filed Weights   12/14/17 2130 12/15/17 0507  Weight: 300 lb (136.1 kg) (!) 300 lb 0.7 oz (136.1 kg)    Telemetry    NSR  - Personally Reviewed  ECG     NSR at 68.  No ST or T wave changes.  - Personally Reviewed  Physical Exam   GEN:  obese, young female, NAD  Neck: No JVD Cardiac: RR ,  No murmurs Respiratory:  tight sounding , especially with cough  GI:  morbidly obese.  MS: No edema; No deformity. Neuro:  Nonfocal  Psych: Normal affect   Labs    Chemistry Recent Labs  Lab 12/14/17 2143 12/15/17 0710  NA 138 140  K  3.9 3.8  CL 104 107  CO2 26 26  GLUCOSE 90 105*  BUN 9 8  CREATININE 0.93 0.87  CALCIUM 9.0 8.6*  GFRNONAA >60 >60  GFRAA >60 >60  ANIONGAP 8 7     Hematology Recent Labs  Lab 12/15/17 0226 12/15/17 0710 12/16/17 0402  WBC 7.6 7.1 5.4  RBC 4.35 4.11 4.09  HGB 12.6 11.8* 11.8*  HCT 41.0 38.1 38.0  MCV 94.3 92.7 92.9  MCH 29.0 28.7 28.9  MCHC 30.7 31.0 31.1  RDW 13.1 13.0 13.1  PLT 292 267 282    Cardiac Enzymes Recent Labs  Lab 12/15/17 0226 12/15/17 0710 12/15/17 1242 12/15/17 1855  TROPONINI <0.03 <0.03 <0.03 <0.03    Recent Labs  Lab 12/14/17 2146 12/15/17 0121  TROPIPOC 0.01 0.18*     BNPNo results for input(s): BNP, PROBNP in the last 168 hours.   DDimer  Recent Labs  Lab 12/15/17 0111  DDIMER 0.39     Radiology    Dg Chest 2 View  Result Date: 12/14/2017 CLINICAL DATA:  Chest pain EXAM: CHEST - 2 VIEW COMPARISON:  08/25/2014 FINDINGS: Normal heart size and mediastinal contours. No acute infiltrate or edema. No effusion or pneumothorax. No acute osseous findings. IMPRESSION: Negative chest. Electronically  Signed   By: Monte Fantasia M.D.   On: 12/14/2017 22:08    Cardiac Studies     Patient Profile     42 y.o. female admitted with increased shortness of breath, cough, and wheezing.  Point-of-care troponin was noted to be mildly elevated but subsequent troponin I levels have all been negative.  Assessment & Plan    1.  Shortness of breath: Her symptoms are more consistent with a pulmonary etiology.  She is having lots of bronchospasm.  Troponins remain negative.  Echocardiogram is unremarkable.  This point I do not think that there is any evidence that this is a cardiac etiology.  We will sign off.  Follow-up with her primary medical doctor.  She does not need cardiology follow-up at this point.  I have advised her to stop smoking.  [facility] will sign off.   Medication Recommendations:  Per IM  Other recommendations (labs, testing,  etc):   Follow up as an outpatient:  With her primary MD     For questions or updates, please contact [facility] Please consult [email or web address] for contact info under Cardiology/STEMI.     Signed, Mertie Moores, MD  12/16/2017, 10:25 AM

## 2017-12-16 NOTE — Discharge Summary (Signed)
Physician Discharge Summary  [place] NGE:[id number] DOB: [birth year] DOA: 12/14/2017  PCP: Boykin Nearing, MD  Admit date: 12/14/2017 Discharge date: 12/16/2017  Time spent: 20 minutes  Recommendations for Outpatient Follow-up:  1. Stratification O3utpatient for statin and weight loss-needs further counseling regarding the same 2. Please keep counseling regarding tobacco abuse 3. Will order DME CPAP machine-probable component of sleep apnea-Case manager to follow-up 4. Needs 1 week appointment PCP-scripts given include Robitussin  Discharge Diagnoses:  Principal Problem:   Exertional dyspnea Active Problems:   TOBACCO DEPENDENCE   Discharge Condition: Improved  Diet recommendation: Heart healthy  Filed Weights   12/14/17 2130 12/15/17 0507  Weight: 136.1 kg (300 lb) (!) 136.1 kg (300 lb 0.7 oz)    History of present illness:  42 year old female BMI 45 admitted with shortness of breath over the past 2 to 3 weeks in addition to sputum however point-of-care troponin showed 0.18-subsequent troponins negative-echocardiogram absolutely no heart failure or wall motion abnormality-appreciate cardiology input-note habitus consistent with OHSS-started CPAP in hospital-needs case manager follow-up-stabilized for discharge  Hospital Course:  See above  Procedures:  Echocardiogram showing mild diastolic dysfunction however no overt signs symptoms of the same on exam   Consultations:CARDIOLOGY  Discharge Exam: Vitals:   12/16/17 0343 12/16/17 0705  BP: (!) 104/58 95/65  Pulse: 66 64  Resp: 13   Temp: 98 F (36.7 C) 98.2 F (36.8 C)  SpO2: 97%     General: Awake alert pleasant no distress Mallampati 4 no icterus no pallor less emotional today Cardiovascular: S1-S2 no murmur rub or gallop  Respiratory: Clear with no wheeze Neurologically intact moving all 4 limbs abdomen obese nontender  Discharge Instructions   Discharge Instructions    Diet - low sodium heart  healthy   Complete by:  As directed    Discharge instructions   Complete by:  As directed    Increase activity slowly   Complete by:  As directed      Allergies as of 12/16/2017      Reactions   Contrast Media [iodinated Diagnostic Agents] Other (See Comments)   Pt became hypotensive,lightheaded,near syncopal the last 2 times she had contrast( prior to 2007)////////NEW UPDATE, COUGHING, TROUBLE BREATHING AND HIVES PER PT//A.CALHOUN   Morphine And Related Itching      Medication List    TAKE these medications   acetaminophen 325 MG tablet Commonly known as:  TYLENOL Take 2 tablets (650 mg total) by mouth every 6 (six) hours as needed for mild pain (or Fever >/= 101).   aspirin EC 325 MG tablet Take 1 tablet (325 mg total) by mouth daily.   buPROPion 150 MG 12 hr tablet Commonly known as:  WELLBUTRIN SR Take 1 tablet (150 mg total) by mouth 2 (two) times daily.   guaiFENesin 100 MG/5ML Soln Commonly known as:  ROBITUSSIN Take 5 mLs (100 mg total) by mouth every 4 (four) hours as needed for cough or to loosen phlegm.   HYDROcodone-acetaminophen 5-325 MG tablet Commonly known as:  NORCO/VICODIN Take 1-2 tablets by mouth every 4 (four) hours as needed.   multivitamin with minerals Tabs tablet Take 1 tablet by mouth daily.   naproxen sodium 220 MG tablet Commonly known as:  ALEVE Take 220 mg by mouth 2 (two) times daily with a meal.   OSTEO BI-FLEX JOINT SHIELD PO Take 1 tablet by mouth daily.   TURMERIC PO Take 1 tablet by mouth daily.      Allergies  Allergen Reactions  .  Contrast Media [Iodinated Diagnostic Agents] Other (See Comments)    Pt became hypotensive,lightheaded,near syncopal the last 2 times she had contrast( prior to 2007)////////NEW UPDATE, COUGHING, TROUBLE BREATHING AND HIVES PER PT//A.CALHOUN  . Morphine And Related Itching      The results of significant diagnostics from this hospitalization (including imaging, microbiology, ancillary and  laboratory) are listed below for reference.    Significant Diagnostic Studies: Dg Chest 2 View  Result Date: 12/14/2017 CLINICAL DATA:  Chest pain EXAM: CHEST - 2 VIEW COMPARISON:  08/25/2014 FINDINGS: Normal heart size and mediastinal contours. No acute infiltrate or edema. No effusion or pneumothorax. No acute osseous findings. IMPRESSION: Negative chest. Electronically Signed   By: Monte Fantasia M.D.   On: 12/14/2017 22:08    Microbiology: Recent Results (from the past 240 hour(s))  MRSA PCR Screening     Status: None   Collection Time: 12/15/17  4:27 AM  Result Value Ref Range Status   MRSA by PCR NEGATIVE NEGATIVE Final    Comment:        The GeneXpert MRSA Assay (FDA approved for NASAL specimens only), is one component of a comprehensive MRSA colonization surveillance program. It is not intended to diagnose MRSA infection nor to guide or monitor treatment for MRSA infections. Performed at [facility], [place] [address], [place], [place] [address]      Labs: Basic Metabolic Panel: Recent Labs  Lab 12/14/17 2143 12/15/17 0710  NA 138 140  K 3.9 3.8  CL 104 107  CO2 26 26  GLUCOSE 90 105*  BUN 9 8  CREATININE 0.93 0.87  CALCIUM 9.0 8.6*   Liver Function Tests: No results for input(s): AST, ALT, ALKPHOS, BILITOT, PROT, ALBUMIN in the last 168 hours. No results for input(s): LIPASE, AMYLASE in the last 168 hours. No results for input(s): AMMONIA in the last 168 hours. CBC: Recent Labs  Lab 12/14/17 2143 12/15/17 0226 12/15/17 0710 12/16/17 0402  WBC 6.7 7.6 7.1 5.4  HGB 13.2 12.6 11.8* 11.8*  HCT 43.4 41.0 38.1 38.0  MCV 93.5 94.3 92.7 92.9  PLT 319 292 267 282   Cardiac Enzymes: Recent Labs  Lab 12/15/17 0226 12/15/17 0710 12/15/17 1242 12/15/17 1855  TROPONINI <0.03 <0.03 <0.03 <0.03   BNP: BNP (last 3 results) No results for input(s): BNP in the last 8760 hours.  ProBNP (last 3 results) No results for input(s): PROBNP in the last 8760  hours.  CBG: No results for input(s): GLUCAP in the last 168 hours.     Signed:  Nita Sells MD   [facility] 12/16/2017, 10:37 AM

## 2017-12-16 NOTE — Care Management Note (Addendum)
Case Management Note  Patient Details  Name: [name] MRN: [id number] Date of Birth: June 07, 1976  Subjective/Objective:                    Action/Plan:   PTA independent from home with daughter.  Pt in agreement for CM to set up PCP appt with [facility] - 8/2 at 10:30am.  Pt informed CM that she had a sleep study in 2016 at [facility] however her PCP told her CPAP was not warranted.  CM explained to pt that CM can not set up appt for sleep study nor CPAP as completed sleep study is an requirement for equipment referral - PCP will have to do that.  CM informed by clinic that pt will need to request sleep study during PCP appt so referral can be sent in.  CM provided sleep study info on AVS and reiterated that pt will need to discuss with with her PCP during her August appt.  CM informed attending that sleep study nor CPAP could be arranged at this time - pt still deemed appropriate for discharge home at this time.  Pts discharge medications are over the counter - MATCH not utilized   Expected Discharge Date:  12/16/17               Expected Discharge Plan:  Home/Self Care  In-House Referral:     Discharge planning Services  CM Consult  Post Acute Care Choice:    Choice offered to:     DME Arranged:    DME Agency:     HH Arranged:    [place] Agency:     Status of Service:  Completed, signed off  If discussed at [employer] of Stay Meetings, dates discussed:    Additional Comments: Alexander Mt, RN 12/16/2017,

## 2017-12-17 LAB — POCT I-STAT TROPONIN I: TROPONIN I, POC: 0.18 ng/mL — AB (ref 0.00–0.08)

## 2017-12-18 NOTE — Progress Notes (Signed)
Pt called [employer] with questions regarding her discharge/when she could return to work, and asked to speak the charge RN, at the time of her Microbiologist (Agricultural consultant for today) was in another pts room and unable to answer her questions, this RN asked for a return phone call to pull up chart and call her back to clarify any questions regarding her d.c she may have. This RN tried x2 to return her call and received no answer.

## 2018-01-01 ENCOUNTER — Other Ambulatory Visit: Payer: Self-pay | Admitting: Occupational Medicine

## 2018-01-01 ENCOUNTER — Ambulatory Visit: Payer: Self-pay

## 2018-01-01 DIAGNOSIS — M25552 Pain in left hip: Secondary | ICD-10-CM

## 2018-01-01 DIAGNOSIS — M25562 Pain in left knee: Secondary | ICD-10-CM

## 2018-01-01 DIAGNOSIS — M25512 Pain in left shoulder: Secondary | ICD-10-CM

## 2018-01-09 ENCOUNTER — Encounter (INDEPENDENT_AMBULATORY_CARE_PROVIDER_SITE_OTHER): Payer: Self-pay | Admitting: Physician Assistant

## 2018-01-09 ENCOUNTER — Ambulatory Visit (INDEPENDENT_AMBULATORY_CARE_PROVIDER_SITE_OTHER): Payer: Self-pay | Admitting: Physician Assistant

## 2018-01-09 ENCOUNTER — Other Ambulatory Visit (INDEPENDENT_AMBULATORY_CARE_PROVIDER_SITE_OTHER): Payer: Self-pay | Admitting: Physician Assistant

## 2018-01-09 ENCOUNTER — Other Ambulatory Visit: Payer: Self-pay

## 2018-01-09 VITALS — BP 99/62 | HR 78 | Temp 97.9°F | Ht 67.0 in | Wt 330.2 lb

## 2018-01-09 DIAGNOSIS — Z131 Encounter for screening for diabetes mellitus: Secondary | ICD-10-CM

## 2018-01-09 DIAGNOSIS — G8929 Other chronic pain: Secondary | ICD-10-CM

## 2018-01-09 DIAGNOSIS — M25512 Pain in left shoulder: Secondary | ICD-10-CM

## 2018-01-09 DIAGNOSIS — M546 Pain in thoracic spine: Secondary | ICD-10-CM

## 2018-01-09 LAB — POCT GLYCOSYLATED HEMOGLOBIN (HGB A1C): Hemoglobin A1C: 5 % (ref 4.0–5.6)

## 2018-01-09 MED ORDER — ACETAMINOPHEN 500 MG PO TABS: 1000.0000 mg | ORAL_TABLET | Freq: Three times a day (TID) | ORAL | 0 refills | Status: AC | PRN

## 2018-01-09 NOTE — Progress Notes (Signed)
Entry error
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
## 2018-01-09 NOTE — Patient Instructions (Signed)
Please monitor your symptoms and go to emergency department if you should experience worsening back pain, shoulder pain, or should you encounter chest pain, palpitations, shortness of breath, headache, tingling, numbness, weakness, sweating, visual disturbances, speech disturbances, or any other worrisome symptom.   Please complete your CAFA application.    Back Pain, Adult Back pain is very common. The pain often gets better over time. The cause of back pain is usually not dangerous. Most people can learn to manage their back pain on their own. Follow these instructions at home: Watch your back pain for any changes. The following actions may help to lessen any pain you are feeling:  Stay active. Start with short walks on flat ground if you can. Try to walk farther each day.  Exercise regularly as told by your doctor. Exercise helps your back heal faster. It also helps avoid future injury by keeping your muscles strong and flexible.  Do not sit, drive, or stand in one place for more than 30 minutes.  Do not stay in bed. Resting more than 1-2 days can slow down your recovery.  Be careful when you bend or lift an object. Use good form when lifting: ? Bend at your knees. ? Keep the object close to your body. ? Do not twist.  Sleep on a firm mattress. Lie on your side, and bend your knees. If you lie on your back, put a pillow under your knees.  Take medicines only as told by your doctor.  Put ice on the injured area. ? Put ice in a plastic bag. ? Place a towel between your skin and the bag. ? Leave the ice on for 20 minutes, 2-3 times a day for the first 2-3 days. After that, you can switch between ice and heat packs.  Avoid feeling anxious or stressed. Find good ways to deal with stress, such as exercise.  Maintain a healthy weight. Extra weight puts stress on your back.  Contact a doctor if:  You have pain that does not go away with rest or medicine.  You have worsening pain  that goes down into your legs or buttocks.  You have pain that does not get better in one week.  You have pain at night.  You lose weight.  You have a fever or chills. Get help right away if:  You cannot control when you poop (bowel movement) or pee (urinate).  Your arms or legs feel weak.  Your arms or legs lose feeling (numbness).  You feel sick to your stomach (nauseous) or throw up (vomit).  You have belly (abdominal) pain.  You feel like you may pass out (faint). This information is not intended to replace advice given to you by your health care provider. Make sure you discuss any questions you have with your health care provider. Document Released: 11/13/2007 Document Revised: 11/02/2015 Document Reviewed: 09/28/2013 Elsevier Interactive Patient Education  [employer].

## 2018-01-26 ENCOUNTER — Other Ambulatory Visit (INDEPENDENT_AMBULATORY_CARE_PROVIDER_SITE_OTHER): Payer: Self-pay | Admitting: Physician Assistant

## 2018-01-26 DIAGNOSIS — R0609 Other forms of dyspnea: Secondary | ICD-10-CM

## 2018-01-26 DIAGNOSIS — R778 Other specified abnormalities of plasma proteins: Secondary | ICD-10-CM

## 2018-01-26 DIAGNOSIS — R7989 Other specified abnormal findings of blood chemistry: Principal | ICD-10-CM

## 2018-02-11 ENCOUNTER — Ambulatory Visit (INDEPENDENT_AMBULATORY_CARE_PROVIDER_SITE_OTHER): Payer: Self-pay | Admitting: Physician Assistant

## 2018-03-02 ENCOUNTER — Ambulatory Visit (INDEPENDENT_AMBULATORY_CARE_PROVIDER_SITE_OTHER): Payer: Medicaid Other | Admitting: Cardiology

## 2018-03-02 ENCOUNTER — Encounter: Payer: Self-pay | Admitting: Cardiology

## 2018-03-02 VITALS — BP 122/62 | HR 82 | Ht 67.0 in | Wt 331.0 lb

## 2018-03-02 DIAGNOSIS — R748 Abnormal levels of other serum enzymes: Secondary | ICD-10-CM | POA: Diagnosis not present

## 2018-03-02 DIAGNOSIS — R778 Other specified abnormalities of plasma proteins: Secondary | ICD-10-CM

## 2018-03-02 DIAGNOSIS — F17200 Nicotine dependence, unspecified, uncomplicated: Secondary | ICD-10-CM | POA: Diagnosis not present

## 2018-03-02 DIAGNOSIS — G4733 Obstructive sleep apnea (adult) (pediatric): Secondary | ICD-10-CM

## 2018-03-02 DIAGNOSIS — R7989 Other specified abnormal findings of blood chemistry: Principal | ICD-10-CM

## 2018-03-02 DIAGNOSIS — Z6841 Body Mass Index (BMI) 40.0 and over, adult: Secondary | ICD-10-CM

## 2018-03-02 NOTE — Patient Instructions (Signed)
Medication Instructions: Your physician recommends that you continue on your current medications as directed. Please refer to the Current Medication list given to you today.   Labwork: None  Procedures/Testing: None  Follow-Up: You can follow up with our office as needed  Any Additional Special Instructions Will Be Listed Below (If Applicable).   DASH Eating Plan DASH stands for "Dietary Approaches to Stop Hypertension." The DASH eating plan is a healthy eating plan that has been shown to reduce high blood pressure (hypertension). It may also reduce your risk for type 2 diabetes, heart disease, and stroke. The DASH eating plan may also help with weight loss. What are tips for following this plan? General guidelines  Avoid eating more than 2,300 mg (milligrams) of salt (sodium) a day. If you have hypertension, you may need to reduce your sodium intake to 1,500 mg a day.  Limit alcohol intake to no more than 1 drink a day for nonpregnant women and 2 drinks a day for men. One drink equals 12 oz of beer, 5 oz of wine, or 1 oz of hard liquor.  Work with your health care provider to maintain a healthy body weight or to lose weight. Ask what an ideal weight is for you.  Get at least 30 minutes of exercise that causes your heart to beat faster (aerobic exercise) most days of the week. Activities may include walking, swimming, or biking.  Work with your health care provider or diet and nutrition specialist (dietitian) to adjust your eating plan to your individual calorie needs. Reading food labels  Check food labels for the amount of sodium per serving. Choose foods with less than 5 percent of the Daily Value of sodium. Generally, foods with less than 300 mg of sodium per serving fit into this eating plan.  To find whole grains, look for the word "whole" as the first word in the ingredient list. Shopping  Buy products labeled as "low-sodium" or "no salt added."  Buy fresh foods. Avoid  canned foods and premade or frozen meals. Cooking  Avoid adding salt when cooking. Use salt-free seasonings or herbs instead of table salt or sea salt. Check with your health care provider or pharmacist before using salt substitutes.  Do not fry foods. Cook foods using healthy methods such as baking, boiling, grilling, and broiling instead.  Cook with heart-healthy oils, such as olive, canola, soybean, or sunflower oil. Meal planning   Eat a balanced diet that includes: ? 5 or more servings of fruits and vegetables each day. At each meal, try to fill half of your plate with fruits and vegetables. ? Up to 6-8 servings of whole grains each day. ? Less than 6 oz of lean meat, poultry, or fish each day. A 3-oz serving of meat is about the same size as a deck of cards. One egg equals 1 oz. ? 2 servings of low-fat dairy each day. ? A serving of nuts, seeds, or beans 5 times each week. ? Heart-healthy fats. Healthy fats called Omega-3 fatty acids are found in foods such as flaxseeds and coldwater fish, like sardines, salmon, and mackerel.  Limit how much you eat of the following: ? Canned or prepackaged foods. ? Food that is high in trans fat, such as fried foods. ? Food that is high in saturated fat, such as fatty meat. ? Sweets, desserts, sugary drinks, and other foods with added sugar. ? Full-fat dairy products.  Do not salt foods before eating.  Try to eat at least   2 vegetarian meals each week.  Eat more home-cooked food and less restaurant, buffet, and fast food.  When eating at a restaurant, ask that your food be prepared with less salt or no salt, if possible. What foods are recommended? The items listed may not be a complete list. Talk with your dietitian about what dietary choices are best for you. Grains Whole-grain or whole-wheat bread. Whole-grain or whole-wheat pasta. Brown rice. Oatmeal. Quinoa. Bulgur. Whole-grain and low-sodium cereals. Pita bread. Low-fat, low-sodium  crackers. Whole-wheat flour tortillas. Vegetables Fresh or frozen vegetables (raw, steamed, roasted, or grilled). Low-sodium or reduced-sodium tomato and vegetable juice. Low-sodium or reduced-sodium tomato sauce and tomato paste. Low-sodium or reduced-sodium canned vegetables. Fruits All fresh, dried, or frozen fruit. Canned fruit in natural juice (without added sugar). Meat and other protein foods Skinless chicken or turkey. Ground chicken or turkey. Pork with fat trimmed off. Fish and seafood. Egg whites. Dried beans, peas, or lentils. Unsalted nuts, nut butters, and seeds. Unsalted canned beans. Lean cuts of beef with fat trimmed off. Low-sodium, lean deli meat. Dairy Low-fat (1%) or fat-free (skim) milk. Fat-free, low-fat, or reduced-fat cheeses. Nonfat, low-sodium ricotta or cottage cheese. Low-fat or nonfat yogurt. Low-fat, low-sodium cheese. Fats and oils Soft margarine without trans fats. Vegetable oil. Low-fat, reduced-fat, or light mayonnaise and salad dressings (reduced-sodium). Canola, safflower, olive, soybean, and sunflower oils. Avocado. Seasoning and other foods Herbs. Spices. Seasoning mixes without salt. Unsalted popcorn and pretzels. Fat-free sweets. What foods are not recommended? The items listed may not be a complete list. Talk with your dietitian about what dietary choices are best for you. Grains Baked goods made with fat, such as croissants, muffins, or some breads. Dry pasta or rice meal packs. Vegetables Creamed or fried vegetables. Vegetables in a cheese sauce. Regular canned vegetables (not low-sodium or reduced-sodium). Regular canned tomato sauce and paste (not low-sodium or reduced-sodium). Regular tomato and vegetable juice (not low-sodium or reduced-sodium). Pickles. Olives. Fruits Canned fruit in a light or heavy syrup. Fried fruit. Fruit in cream or butter sauce. Meat and other protein foods Fatty cuts of meat. Ribs. Fried meat. Bacon. Sausage. Bologna and  other processed lunch meats. Salami. Fatback. Hotdogs. Bratwurst. Salted nuts and seeds. Canned beans with added salt. Canned or smoked fish. Whole eggs or egg yolks. Chicken or turkey with skin. Dairy Whole or 2% milk, cream, and half-and-half. Whole or full-fat cream cheese. Whole-fat or sweetened yogurt. Full-fat cheese. Nondairy creamers. Whipped toppings. Processed cheese and cheese spreads. Fats and oils Butter. Stick margarine. Lard. Shortening. Ghee. Bacon fat. Tropical oils, such as coconut, palm kernel, or palm oil. Seasoning and other foods Salted popcorn and pretzels. Onion salt, garlic salt, seasoned salt, table salt, and sea salt. Worcestershire sauce. Tartar sauce. Barbecue sauce. Teriyaki sauce. Soy sauce, including reduced-sodium. Steak sauce. Canned and packaged gravies. Fish sauce. Oyster sauce. Cocktail sauce. Horseradish that you find on the shelf. Ketchup. Mustard. Meat flavorings and tenderizers. Bouillon cubes. Hot sauce and Tabasco sauce. Premade or packaged marinades. Premade or packaged taco seasonings. Relishes. Regular salad dressings. Where to find more information:  National Heart, Lung, and Blood Institute: www.nhlbi.nih.gov  American Heart Association: www.heart.org Summary  The DASH eating plan is a healthy eating plan that has been shown to reduce high blood pressure (hypertension). It may also reduce your risk for type 2 diabetes, heart disease, and stroke.  With the DASH eating plan, you should limit salt (sodium) intake to 2,300 mg a day. If you have hypertension, you may   need to reduce your sodium intake to 1,500 mg a day.  When on the DASH eating plan, aim to eat more fresh fruits and vegetables, whole grains, lean proteins, low-fat dairy, and heart-healthy fats.  Work with your health care provider or diet and nutrition specialist (dietitian) to adjust your eating plan to your individual calorie needs. This information is not intended to replace advice  given to you by your health care provider. Make sure you discuss any questions you have with your health care provider. Document Released: 05/16/2011 Document Revised: 05/20/2016 Document Reviewed: 05/20/2016 Elsevier Interactive Patient Education  2018 [employer]    If you need a refill on your cardiac medications before your next appointment, please call your pharmacy.   
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
## 2018-03-05 ENCOUNTER — Ambulatory Visit (INDEPENDENT_AMBULATORY_CARE_PROVIDER_SITE_OTHER): Payer: Medicaid Other | Admitting: Physician Assistant

## 2018-03-05 ENCOUNTER — Encounter (INDEPENDENT_AMBULATORY_CARE_PROVIDER_SITE_OTHER): Payer: Self-pay | Admitting: Physician Assistant

## 2018-03-05 ENCOUNTER — Other Ambulatory Visit: Payer: Self-pay

## 2018-03-05 VITALS — BP 120/77 | HR 90 | Temp 98.5°F | Ht 67.0 in | Wt 333.4 lb

## 2018-03-05 DIAGNOSIS — F17200 Nicotine dependence, unspecified, uncomplicated: Secondary | ICD-10-CM

## 2018-03-05 DIAGNOSIS — S46912A Strain of unspecified muscle, fascia and tendon at shoulder and upper arm level, left arm, initial encounter: Secondary | ICD-10-CM

## 2018-03-05 MED ORDER — VARENICLINE TARTRATE 1 MG PO TABS: 1.0000 mg | ORAL_TABLET | Freq: Two times a day (BID) | ORAL | 2 refills | Status: DC

## 2018-03-05 MED ORDER — VARENICLINE TARTRATE 0.5 MG X 11 & 1 MG X 42 PO MISC
ORAL | 0 refills | Status: DC
Start: 2018-03-05 — End: 2018-06-24

## 2018-03-05 MED ORDER — NAPROXEN 500 MG PO TABS: 500.0000 mg | ORAL_TABLET | Freq: Two times a day (BID) | ORAL | 1 refills | Status: DC

## 2018-03-05 MED ORDER — CYCLOBENZAPRINE HCL 10 MG PO TABS: 10.0000 mg | ORAL_TABLET | Freq: Every evening | ORAL | 1 refills | Status: DC | PRN
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
## 2018-03-05 NOTE — Patient Instructions (Signed)
Shoulder Sprain A shoulder sprain is a partial or complete tear in one of the tough, fiber-like tissues (ligaments) in the shoulder. The ligaments in the shoulder help to hold the shoulder in place. What are the causes? This condition may be caused by:  A fall.  A hit to the shoulder.  A twist of the arm.  What increases the risk? This condition is more likely to develop in:  People who play sports.  People who have problems with balance or coordination.  What are the signs or symptoms? Symptoms of this condition include:  Pain when moving the shoulder.  Limited ability to move the shoulder.  Swelling and tenderness on top of the shoulder.  Warmth in the shoulder.  A change in the shape of the shoulder.  Redness or bruising on the shoulder.  How is this diagnosed? This condition is diagnosed with a physical exam. During the exam, you may be asked to do simple exercises with your shoulder. You may also have imaging tests, such as X-rays, MRI, or a CT scan. These tests can show how severe the sprain is. How is this treated? This condition may be treated with:  Rest.  Pain medicine.  Ice.  A sling or brace. This is used to keep the arm still while the shoulder is healing.  Physical therapy or rehabilitation exercises. These help to improve the range of motion and strength of the shoulder.  Surgery (rare). Surgery may be needed if the sprain caused a joint to become unstable. Surgery may also be needed to reduce pain.  Some people may develop ongoing shoulder pain or lose some range of motion in the shoulder. However, most people do not develop long-term problems. Follow these instructions at home:  Rest.  Ask your health care provider when it is safe for you to drive if you have a sling or brace on your shoulder.  Take over-the-counter and prescription medicines only as told by your health care provider.  If directed, apply ice to the area: ? Put ice in a  plastic bag. ? Place a towel between your skin and the bag. ? Leave the ice on for 20 minutes, 2-3 times per day.  If you were given a shoulder sling or brace: ? Wear it as told. ? Remove it to shower or bathe. ? Move your arm only as much as told by your health care provider, but keep your hand moving to prevent swelling.  If you were shown how to do any exercises, do them as told by your health care provider.  Keep all follow-up visits as told by your health care provider. This is important. Contact a health care provider if:  Your pain gets worse.  Your pain is not relieved with medicines.  You have increased redness or swelling. Get help right away if:  You have a fever.  You cannot move your arm or shoulder.  You develop severe numbness or tingling in your arm, hand, or fingers.  Your arm, hand, or fingers turn blue, white, or gray and feel cold. This information is not intended to replace advice given to you by your health care provider. Make sure you discuss any questions you have with your health care provider. Document Released: 10/13/2008 Document Revised: 01/21/2016 Document Reviewed: 09/19/2014 Elsevier Interactive Patient Education  2018 Elsevier Inc.  

## 2018-03-26 ENCOUNTER — Ambulatory Visit: Payer: Medicaid Other | Attending: Physician Assistant | Admitting: Physical Therapy

## 2018-05-04 ENCOUNTER — Ambulatory Visit (INDEPENDENT_AMBULATORY_CARE_PROVIDER_SITE_OTHER): Payer: Medicaid Other | Admitting: Physician Assistant

## 2018-05-04 ENCOUNTER — Encounter (INDEPENDENT_AMBULATORY_CARE_PROVIDER_SITE_OTHER): Payer: Self-pay | Admitting: Physician Assistant

## 2018-05-04 VITALS — BP 116/80 | HR 87 | Temp 98.2°F | Resp 18 | Ht 67.0 in | Wt 339.0 lb

## 2018-05-04 DIAGNOSIS — Z3202 Encounter for pregnancy test, result negative: Secondary | ICD-10-CM

## 2018-05-04 DIAGNOSIS — R5383 Other fatigue: Secondary | ICD-10-CM

## 2018-05-04 DIAGNOSIS — Z6841 Body Mass Index (BMI) 40.0 and over, adult: Secondary | ICD-10-CM

## 2018-05-04 DIAGNOSIS — N912 Amenorrhea, unspecified: Secondary | ICD-10-CM | POA: Diagnosis not present

## 2018-05-04 DIAGNOSIS — R400 Somnolence: Secondary | ICD-10-CM | POA: Diagnosis not present

## 2018-05-04 LAB — POCT URINE PREGNANCY: Preg Test, Ur: NEGATIVE

## 2018-05-04 MED ORDER — PHENTERMINE HCL 15 MG PO CAPS: 15.0000 mg | ORAL_CAPSULE | ORAL | 0 refills | Status: DC

## 2018-05-04 NOTE — Patient Instructions (Signed)
Obesity, Adult  Obesity is the condition of having too much total body fat. Being overweight or obese means that your weight is greater than what is considered healthy for your body size. Obesity is determined by a measurement called BMI. BMI is an estimate of body fat and is calculated from height and weight. For adults, a BMI of 30 or higher is considered obese.  Obesity can eventually lead to other health concerns and major illnesses, including:  · Stroke.  · Coronary artery disease (CAD).  · Type 2 diabetes.  · Some types of cancer, including cancers of the colon, breast, uterus, and gallbladder.  · Osteoarthritis.  · High blood pressure (hypertension).  · High cholesterol.  · Sleep apnea.  · Gallbladder stones.  · Infertility problems.    What are the causes?  The main cause of obesity is taking in (consuming) more calories than your body uses for energy. Other factors that contribute to this condition may include:  · Being born with genes that make you more likely to become obese.  · Having a medical condition that causes obesity. These conditions include:  ? Hypothyroidism.  ? Polycystic ovarian syndrome (PCOS).  ? Binge-eating disorder.  ? Cushing syndrome.  · Taking certain medicines, such as steroids, antidepressants, and seizure medicines.  · Not being physically active (sedentary lifestyle).  · Living where there are limited places to exercise safely or buy healthy foods.  · Not getting enough sleep.    What increases the risk?  The following factors may increase your risk of this condition:  · Having a family history of obesity.  · Being a woman of African-American descent.  · Being a man of Hispanic descent.    What are the signs or symptoms?  Having excessive body fat is the main symptom of this condition.  How is this diagnosed?  This condition may be diagnosed based on:  · Your symptoms.  · Your medical history.  · A physical exam. Your health care provider may measure:  ? Your BMI. If you are an  adult with a BMI between 25 and less than 30, you are considered overweight. If you are an adult with a BMI of 30 or higher, you are considered obese.  ? The distances around your hips and your waist (circumferences). These may be compared to each other to help diagnose your condition.  ? Your skinfold thickness. Your health care provider may gently pinch a fold of your skin and measure it.    How is this treated?  Treatment for this condition often includes changing your lifestyle. Treatment may include some or all of the following:  · Dietary changes. Work with your health care provider and a dietitian to set a weight-loss goal that is healthy and reasonable for you. Dietary changes may include eating:  ? Smaller portions. A portion size is the amount of a particular food that is healthy for you to eat at one time. This varies from person to person.  ? Low-calorie or low-fat options.  ? More whole grains, fruits, and vegetables.  · Regular physical activity. This may include aerobic activity (cardio) and strength training.  · Medicine to help you lose weight. Your health care provider may prescribe medicine if you are unable to lose 1 pound a week after 6 weeks of eating more healthily and doing more physical activity.  · Surgery. Surgical options may include gastric banding and gastric bypass. Surgery may be done if:  ? Other   treatments have not helped to improve your condition.  ? You have a BMI of 40 or higher.  ? You have life-threatening health problems related to obesity.    Follow these instructions at home:    Eating and drinking    · Follow recommendations from your health care provider about what you eat and drink. Your health care provider may advise you to:  ? Limit fast foods, sweets, and processed snack foods.  ? Choose low-fat options, such as low-fat milk instead of whole milk.  ? Eat 5 or more servings of fruits or vegetables every day.  ? Eat at home more often. This gives you more control over  what you eat.  ? Choose healthy foods when you eat out.  ? Learn what a healthy portion size is.  ? Keep low-fat snacks on hand.  ? Avoid sugary drinks, such as soda, fruit juice, iced tea sweetened with sugar, and flavored milk.  ? Eat a healthy breakfast.  · Drink enough water to keep your urine clear or pale yellow.  · Do not go without eating for long periods of time (do not fast) or follow a fad diet. Fasting and fad diets can be unhealthy and even dangerous.  Physical Activity  · Exercise regularly, as told by your health care provider. Ask your health care provider what types of exercise are safe for you and how often you should exercise.  · Warm up and stretch before being active.  · Cool down and stretch after being active.  · Rest between periods of activity.  Lifestyle  · Limit the time that you spend in front of your TV, computer, or video game system.  · Find ways to reward yourself that do not involve food.  · Limit alcohol intake to no more than 1 drink a day for nonpregnant women and 2 drinks a day for men. One drink equals 12 oz of beer, 5 oz of wine, or 1½ oz of hard liquor.  General instructions  · Keep a weight loss journal to keep track of the food you eat and how much you exercise you get.  · Take over-the-counter and prescription medicines only as told by your health care provider.  · Take vitamins and supplements only as told by your health care provider.  · Consider joining a support group. Your health care provider may be able to recommend a support group.  · Keep all follow-up visits as told by your health care provider. This is important.  Contact a health care provider if:  · You are unable to meet your weight loss goal after 6 weeks of dietary and lifestyle changes.  This information is not intended to replace advice given to you by your health care provider. Make sure you discuss any questions you have with your health care provider.  Document Released: 07/04/2004 Document Revised:  10/30/2015 Document Reviewed: 03/15/2015  Elsevier Interactive Patient Education © 2018 Elsevier Inc.

## 2018-05-04 NOTE — Progress Notes (Signed)
Patient request a gynecology referral due to cycle coming in April. No cycle may. Returned June/July/Aug and has not been present since. Patient denies pregnancy due to tubal ligation in 2006 after 4 children and has taken recent home PT. Patient states fibroids were noted years ago.  Patient would also like a referral to complete a sleep study.

## 2018-05-04 NOTE — Progress Notes (Signed)
Subjective:  Patient ID: [name], female    DOB: [birth year]  Age: 42 y.o. MRN: [id number]  CC: menstrual issues  HPI  Pamela N Bartleyis a 42 y.o.femalewith a medical history of cervical dysplasia, OSA, migraine, tobacco abuse, and obesity presents with concern about menstrual irregularity. Has been having intermittent amenorrhea since 9 months ago. LMP two months ago. Says she has not been sexually active since 9 months ago. Took two pregnancy tests at home which were negative. Had a CT abdomen and pelvis 06/02/17 which revealed "probable 4.3 cm cyst in the right ovary". Had US transvaginal on 05/23/1999 but report unavailable.     Patient also concerned about excessive fatigue and somnolence during the day. She had a sleep study on 02/03/15 which confirmed mild sleep apnea. She weighed 318 lbs at the time. Weighs 339 lbs today.       Outpatient Medications Prior to Visit  Medication Sig Dispense Refill  . cyclobenzaprine (FLEXERIL) 10 MG tablet Take 1 tablet (10 mg total) by mouth at bedtime as needed for muscle spasms. 15 tablet 1  . naproxen (NAPROSYN) 500 MG tablet Take 1 tablet (500 mg total) by mouth 2 (two) times daily with a meal. 30 tablet 1  . varenicline (CHANTIX CONTINUING MONTH PAK) 1 MG tablet Take 1 tablet (1 mg total) by mouth 2 (two) times daily. 60 tablet 2  . varenicline (CHANTIX STARTING MONTH PAK) 0.5 MG X 11 & 1 MG X 42 tablet Take one 0.5 mg tablet by mouth once daily for 3 days, then increase to one 0.5 mg tablet twice daily for 4 days, then increase to one 1 mg tablet twice daily. 53 tablet 0   No facility-administered medications prior to visit.      ROS Review of Systems  Constitutional: Positive for malaise/fatigue. Negative for chills and fever.  Eyes: Negative for blurred vision.  Respiratory: Negative for shortness of breath.   Cardiovascular: Negative for chest pain and palpitations.  Gastrointestinal: Negative for abdominal pain and nausea.   Genitourinary: Negative for dysuria and hematuria.  Musculoskeletal: Negative for joint pain and myalgias.  Skin: Negative for rash.  Neurological: Negative for tingling and headaches.  Psychiatric/Behavioral: Negative for depression. The patient is not nervous/anxious.     Objective:  BP 116/80 (BP Location: Left Arm, Patient Position: Sitting, Cuff Size: Large)   Pulse 87   Temp 98.2 F (36.8 C) (Oral)   Resp 18   Ht 5\' 7"  (1.702 m)   Wt (!) 339 lb (153.8 kg)   LMP 01/22/2018 Comment: tubes tied 2006  SpO2 97%   BMI 53.09 kg/m   BP/Weight 05/04/2018 03/05/2018 6/73/4193  Systolic BP 790 240 973  Diastolic BP 80 77 62  Wt. (Lbs) 339 333.4 331  BMI 53.09 52.22 51.84      Physical Exam  Constitutional: She is oriented to person, place, and time.  Well developed, well nourished, NAD, polite  HENT:  Head: Normocephalic and atraumatic.  Eyes: No scleral icterus.  Neck: Normal range of motion. Neck supple. No thyromegaly present.  Cardiovascular: Normal rate, regular rhythm and normal heart sounds.  No LE edema bilaterally. No carotid bruit bilaterally.  Pulmonary/Chest: Effort normal and breath sounds normal.  Musculoskeletal: She exhibits no edema.  Neurological: She is alert and oriented to person, place, and time.  Skin: Skin is warm and dry. No rash noted. No erythema. No pallor.  Psychiatric: She has a normal mood and affect. Her behavior is normal. Thought  content normal.  Vitals reviewed.    Assessment & Plan:    1. Amenorrhea - POCT urine pregnancy negative - TSH; Future - FSH/LH; Future - US Pelvic Complete With Transvaginal; Future  2. Fatigue, unspecified type - Likely attributed to OSA.  3. Daytime somnolence - Cpap titration; Future  4. Morbid obesity ([place]) - Begin phentermine 15 MG capsule; Take 1 capsule (15 mg total) by mouth every morning.  Dispense: 30 capsule; Refill: 0 - Lipid panel; Future - TSH; Future - Comprehensive metabolic  panel; Future   Meds ordered this encounter  Medications  . phentermine 15 MG capsule    Sig: Take 1 capsule (15 mg total) by mouth every morning.    Dispense:  30 capsule    Refill:  0    Order Specific Question:   Supervising Provider    Answer:   Charlott Rakes [[id number]]    Follow-up: Return in about 4 weeks (around 06/01/2018) for Obesity.   Clent Demark PA

## 2018-05-05 ENCOUNTER — Other Ambulatory Visit (INDEPENDENT_AMBULATORY_CARE_PROVIDER_SITE_OTHER): Payer: Self-pay | Admitting: Physician Assistant

## 2018-05-05 DIAGNOSIS — N83201 Unspecified ovarian cyst, right side: Secondary | ICD-10-CM

## 2018-05-12 ENCOUNTER — Ambulatory Visit (HOSPITAL_COMMUNITY): Payer: Medicaid Other

## 2018-05-15 ENCOUNTER — Other Ambulatory Visit (INDEPENDENT_AMBULATORY_CARE_PROVIDER_SITE_OTHER): Payer: Self-pay | Admitting: Physician Assistant

## 2018-05-15 DIAGNOSIS — Z8669 Personal history of other diseases of the nervous system and sense organs: Secondary | ICD-10-CM

## 2018-05-15 DIAGNOSIS — R400 Somnolence: Secondary | ICD-10-CM

## 2018-05-15 NOTE — Progress Notes (Unsigned)
slp  

## 2018-05-19 ENCOUNTER — Ambulatory Visit (HOSPITAL_COMMUNITY)
Admission: RE | Admit: 2018-05-19 | Discharge: 2018-05-19 | Disposition: A | Discharge status: Home / Self Care | Payer: Medicaid Other | Source: Ambulatory Visit | Attending: Physician Assistant | Admitting: Physician Assistant

## 2018-05-19 DIAGNOSIS — N83201 Unspecified ovarian cyst, right side: Secondary | ICD-10-CM | POA: Insufficient documentation

## 2018-05-19 DIAGNOSIS — N912 Amenorrhea, unspecified: Secondary | ICD-10-CM | POA: Diagnosis present

## 2018-05-20 ENCOUNTER — Telehealth (INDEPENDENT_AMBULATORY_CARE_PROVIDER_SITE_OTHER): Payer: Self-pay

## 2018-05-20 NOTE — Telephone Encounter (Signed)
Left voicemail notifying patient that pelvic and transvaginal US revealed a small simple cyst on the left ovary. Does not appear malignant. Repeat US in the future to test for growth or resolution. Return call to [facility] at [phone] with any questions or concerns. Nat Christen, CMA

## 2018-05-20 NOTE — Telephone Encounter (Signed)
-----   Message from Clent Demark, PA-C sent at 05/19/2018 12:13 PM EST ----- US pelvis and transvag reveals 2.5 cm simple cyst on left ovary, does not appear malignant. Repeat imaging in the future to test for growth or resolution.

## 2018-05-22 ENCOUNTER — Ambulatory Visit (HOSPITAL_BASED_OUTPATIENT_CLINIC_OR_DEPARTMENT_OTHER): Payer: Medicaid Other | Attending: Physician Assistant | Admitting: Internal Medicine

## 2018-05-22 VITALS — Ht 67.0 in | Wt 340.0 lb

## 2018-05-22 DIAGNOSIS — R400 Somnolence: Secondary | ICD-10-CM | POA: Diagnosis present

## 2018-05-22 DIAGNOSIS — Z9989 Dependence on other enabling machines and devices: Secondary | ICD-10-CM

## 2018-05-22 DIAGNOSIS — Z8669 Personal history of other diseases of the nervous system and sense organs: Secondary | ICD-10-CM | POA: Insufficient documentation

## 2018-05-22 DIAGNOSIS — G4733 Obstructive sleep apnea (adult) (pediatric): Secondary | ICD-10-CM

## 2018-05-28 ENCOUNTER — Other Ambulatory Visit (INDEPENDENT_AMBULATORY_CARE_PROVIDER_SITE_OTHER): Payer: Self-pay | Admitting: Physician Assistant

## 2018-05-28 DIAGNOSIS — F17200 Nicotine dependence, unspecified, uncomplicated: Secondary | ICD-10-CM

## 2018-05-28 NOTE — Telephone Encounter (Signed)
Why a refill of the starting month pack? She should be on the continuing month doses.

## 2018-05-28 NOTE — Telephone Encounter (Signed)
FWD to PCP. Labella Zahradnik S Emberlee Sortino, CMA  

## 2018-06-01 ENCOUNTER — Telehealth (INDEPENDENT_AMBULATORY_CARE_PROVIDER_SITE_OTHER): Payer: Self-pay | Admitting: Physician Assistant

## 2018-06-01 ENCOUNTER — Ambulatory Visit (INDEPENDENT_AMBULATORY_CARE_PROVIDER_SITE_OTHER): Payer: Medicaid Other | Admitting: Nurse Practitioner

## 2018-06-01 NOTE — Telephone Encounter (Signed)
Patient will have to be seen before she can get a refill of phentermine. [name]

## 2018-06-01 NOTE — Telephone Encounter (Signed)
I have called the patient to reschedule her appointment for this afternoon due to the provider being sick. Patient stated she is out of her phentermine 15 MG capsule  She stated that PCP told her that after she is done with this dosage he would increase to 30 mg.  Patient uses [facility] on [address] but will like the RX to be printed  Please Advice [phone]  Thank you Emmit Pomfret

## 2018-06-04 NOTE — Telephone Encounter (Signed)
Does not need. Already refilled. Nat Christen, CMA

## 2018-06-05 ENCOUNTER — Ambulatory Visit (INDEPENDENT_AMBULATORY_CARE_PROVIDER_SITE_OTHER): Payer: Medicaid Other | Admitting: Physician Assistant

## 2018-06-07 DIAGNOSIS — G4733 Obstructive sleep apnea (adult) (pediatric): Secondary | ICD-10-CM

## 2018-06-07 NOTE — Procedures (Signed)
Patient Name: [name], [name] Study Date: 05/22/2018 Gender: Female D.O.B: 01-31-1976 Age (years): 42 Referring Provider: Clent Demark PA Height (inches): 79 Interpreting Physician: Baird Lyons MD, ABSM Weight (lbs): 318 RPSGT: Lanae Boast BMI: 50 MRN: [id number] Neck Size: 16.00  CLINICAL INFORMATION Sleep Study Type: Split Night CPAP Indication for sleep study: OSA Epworth Sleepiness Score: 2  SLEEP STUDY TECHNIQUE As per the AASM Manual for the Scoring of Sleep and Associated Events v2.3 (April 2016) with a hypopnea requiring 4% desaturations.  The channels recorded and monitored were frontal, central and occipital EEG, electrooculogram (EOG), submentalis EMG (chin), nasal and oral airflow, thoracic and abdominal wall motion, anterior tibialis EMG, snore microphone, electrocardiogram, and pulse oximetry. Continuous positive airway pressure (CPAP) was initiated when the patient met split night criteria and was titrated according to treat sleep-disordered breathing.  MEDICATIONS Medications self-administered by patient taken the night of the study : Tylenol PM  RESPIRATORY PARAMETERS Diagnostic  Total AHI (/hr): 32.6 RDI (/hr): 33.1 OA Index (/hr): 7 CA Index (/hr): 0.0 REM AHI (/hr): 55.0 NREM AHI (/hr): 24.7 Supine AHI (/hr): 32.6 Non-supine AHI (/hr): 0 Min O2 Sat (%): 79.0 Mean O2 (%): 93.1 Time below 88% (min): 5.5   Titration  Optimal Pressure (cm):  AHI at Optimal Pressure (/hr): N/A Min O2 at Optimal Pressure (%): 86.0 Supine % at Optimal (%): N/A Sleep % at Optimal (%): N/A   SLEEP ARCHITECTURE The recording time for the entire night was 432.4 minutes.  During a baseline period of 254.4 minutes, the patient slept for 230.0 minutes in REM and nonREM, yielding a sleep efficiency of 90.4%%. Sleep onset after lights out was 16.3 minutes with a REM latency of 76.0 minutes. The patient spent 4.6%% of the night in stage N1 sleep, 69.3%% in stage N2  sleep, 0.0%% in stage N3 and 26.1% in REM.  During the titration period of 175.8 minutes, the patient slept for 171.0 minutes in REM and nonREM, yielding a sleep efficiency of 97.3%%. Sleep onset after CPAP initiation was 0.7 minutes with a REM latency of 94.5 minutes. The patient spent 5.3%% of the night in stage N1 sleep, 65.8%% in stage N2 sleep, 0.0%% in stage N3 and 28.9% in REM.  CARDIAC DATA The 2 lead EKG demonstrated sinus rhythm. The mean heart rate was 100.0 beats per minute. Other EKG findings include: None.  LEG MOVEMENT DATA The total Periodic Limb Movements of Sleep (PLMS) were 0. The PLMS index was 0.0 .  IMPRESSIONS - Severe obstructive sleep apnea occurred during the diagnostic portion of the study (AHI = 32.6/hour). - Final PAP was 15 cwp by end of available study time, with a few residual events.  - No significant central sleep apnea occurred during the diagnostic portion of the study (CAI = 0.0/hour). - Moderate oxygen desaturation was noted during the diagnostic portion of the study (Min O2 =79.0%). Minimum sat at CPAP 15 was 90%. - The patient snored with moderate snoring volume during the diagnostic portion of the study. - No cardiac abnormalities were noted during this study. - Clinically significant periodic limb movements did not occur during sleep. A few limb jerks were recorded, without associated sleep disturbance.  DIAGNOSIS - Obstructive Sleep Apnea (327.23 [G47.33 ICD-10])  RECOMMENDATIONS - Recommend trial at CPAP 16, or autopap 10-20. - The patient wore a medium Resmed AirFit 20 full face mask with heated humidity. - Be careful with alcohol, sedatives and other CNS depressants that may worsen sleep apnea and disrupt  normal sleep architecture. - Sleep hygiene should be reviewed to assess factors that may improve sleep quality. - Weight management and regular exercise should be initiated or continued.  [Electronically signed] 06/07/2018 10:26  AM  Baird Lyons MD, ABSM Diplomate, American Board of Sleep Medicine   NPI: [id number]                          [place] [place] [facility]  ELECTRONICALLY SIGNED ON:  06/07/2018, 10:17 AM [place] PH: [phone]   FX: [phone] [place]

## 2018-06-08 ENCOUNTER — Other Ambulatory Visit (INDEPENDENT_AMBULATORY_CARE_PROVIDER_SITE_OTHER): Payer: Self-pay | Admitting: Physician Assistant

## 2018-06-08 DIAGNOSIS — G4733 Obstructive sleep apnea (adult) (pediatric): Secondary | ICD-10-CM

## 2018-06-12 DIAGNOSIS — R948 Abnormal results of function studies of other organs and systems: Secondary | ICD-10-CM | POA: Diagnosis not present

## 2018-06-12 DIAGNOSIS — Z6841 Body Mass Index (BMI) 40.0 and over, adult: Secondary | ICD-10-CM | POA: Diagnosis not present

## 2018-06-17 DIAGNOSIS — K219 Gastro-esophageal reflux disease without esophagitis: Secondary | ICD-10-CM | POA: Diagnosis not present

## 2018-06-24 ENCOUNTER — Other Ambulatory Visit: Payer: Self-pay

## 2018-06-24 ENCOUNTER — Encounter (INDEPENDENT_AMBULATORY_CARE_PROVIDER_SITE_OTHER): Payer: Self-pay | Admitting: Nurse Practitioner

## 2018-06-24 ENCOUNTER — Ambulatory Visit (INDEPENDENT_AMBULATORY_CARE_PROVIDER_SITE_OTHER): Payer: Medicaid Other | Admitting: Nurse Practitioner

## 2018-06-24 DIAGNOSIS — Z6841 Body Mass Index (BMI) 40.0 and over, adult: Secondary | ICD-10-CM | POA: Diagnosis not present

## 2018-06-24 DIAGNOSIS — N912 Amenorrhea, unspecified: Secondary | ICD-10-CM

## 2018-06-24 MED ORDER — PHENTERMINE HCL 37.5 MG PO TABS: 37.5000 mg | ORAL_TABLET | Freq: Every day | ORAL | 0 refills | Status: DC

## 2018-06-24 NOTE — Progress Notes (Signed)
Assessment & Plan:  Kristal was seen today for follow-up.  Diagnoses and all orders for this visit:  Morbid obesity ([place]) -     phentermine (ADIPEX-P) 37.5 MG tablet; Take 1 tablet (37.5 mg total) by mouth daily before breakfast for 30 days. -     CMP14+EGFR -     TSH -     Lipid panel  Amenorrhea -     Ambulatory referral to Gynecology -     CBC -     FSH/LH    Patient has been counseled on age-appropriate routine health concerns for screening and prevention. These are reviewed and up-to-date. Referrals have been placed accordingly. Immunizations are up-to-date or declined.    Subjective:   Chief Complaint  Patient presents with  . Follow-up    obesity   HPI [facility] 43 y.o. female presents to office today for follow up to weight loss.  She is also being followed at the weight loss center. She had lost almost 10 lbs since starting phentermine however she ran out of her medication a few weeks ago and her weight has increased. She continues on a 1350 calorie a day diet. Will refill phentermine for 30 days.Will increase dosage from 70m to 37.565mdaily.     Amenorrhea She has a history of cervical dysplasia with amenorrhea for the past 4 months.  Pelvic [place]2-03-2018: No significant abnormality Measurements: 4.5 x 2.5 x 4.0 cm = volume: 23.9 mL. 2.5 cm simple cyst or dominant follicle. No adnexal mass. Will order FSH/LH and TSH today. She has also been referred to GYN for PAP. Due to body habitus will not be able to perform a PAP here in the clinic.   Review of Systems  Constitutional: Negative for fever, malaise/fatigue and weight loss.  HENT: Negative.  Negative for nosebleeds.   Eyes: Negative.  Negative for blurred vision, double vision and photophobia.  Respiratory: Negative.  Negative for cough and shortness of breath.   Cardiovascular: Negative.  Negative for chest pain, palpitations and leg swelling.  Gastrointestinal: Negative.  Negative for heartburn, nausea  and vomiting.  Genitourinary:       Amenorrhea  Musculoskeletal: Negative.  Negative for myalgias.  Neurological: Negative.  Negative for dizziness, focal weakness, seizures and headaches.  Psychiatric/Behavioral: Negative.  Negative for suicidal ideas.    Past Medical History:  Diagnosis Date  . Cervical dysplasia 1993   . Migraine   . Obesity     Past Surgical History:  Procedure Laterality Date  . CESAREAN SECTION     x 4  . COLPOSCOPY    . TUBAL LIGATION Bilateral 2006  . UMBILICAL HERNIA REPAIR  2007    Family History  Problem Relation Age of Onset  . Ovarian cancer Mother 5570. Heart failure Mother   . Depression Mother   . Cancer Mother        uterine cancer   . Heart failure Father [id number]     MI  . Diabetes Father        type 2  . Hypertension Father   . Heart disease Sister     Social History Reviewed with no changes to be made today.   Outpatient Medications Prior to Visit  Medication Sig Dispense Refill  . varenicline (CHANTIX CONTINUING MONTH PAK) 1 MG tablet Take 1 tablet (1 mg total) by mouth 2 (two) times daily. 60 tablet 2  . cyclobenzaprine (FLEXERIL) 10 MG tablet Take 1 tablet (10  mg total) by mouth at bedtime as needed for muscle spasms. 15 tablet 1  . naproxen (NAPROSYN) 500 MG tablet Take 1 tablet (500 mg total) by mouth 2 (two) times daily with a meal. 30 tablet 1  . phentermine 15 MG capsule Take 1 capsule (15 mg total) by mouth every morning. (Patient not taking: Reported on 06/24/2018) 30 capsule 0  . varenicline (CHANTIX STARTING MONTH PAK) 0.5 MG X 11 & 1 MG X 42 tablet Take one 0.5 mg tablet by mouth once daily for 3 days, then increase to one 0.5 mg tablet twice daily for 4 days, then increase to one 1 mg tablet twice daily. (Patient not taking: Reported on 06/24/2018) 53 tablet 0   No facility-administered medications prior to visit.     Allergies  Allergen Reactions  . Iodinated Diagnostic Agents Other (See Comments) and Nausea And  Vomiting    Pt became hypotensive,lightheaded,near syncopal the last 2 times she had contrast( prior to 2007)////////NEW UPDATE, COUGHING, TROUBLE BREATHING AND HIVES PER PT//A.CALHOUN  . Morphine And Related Itching       Objective:    BP 126/72 (BP Location: Left Arm, Patient Position: Sitting, Cuff Size: Large)   Pulse 78   Temp 98 F (36.7 C) (Oral)   Ht 5' 7"  (1.702 m)   Wt (!) 342 lb (155.1 kg)   LMP 02/22/2018   SpO2 96%   BMI 53.56 kg/m  Wt Readings from Last 3 Encounters:  06/24/18 (!) 342 lb (155.1 kg)  05/22/18 (!) 340 lb (154.2 kg)  05/04/18 (!) 339 lb (153.8 kg)    Physical Exam Vitals signs and nursing note reviewed.  Constitutional:      Appearance: She is well-developed.  HENT:     Head: Normocephalic and atraumatic.  Neck:     Musculoskeletal: Normal range of motion.  Cardiovascular:     Rate and Rhythm: Normal rate and regular rhythm.     Heart sounds: Normal heart sounds. No murmur. No friction rub. No gallop.   Pulmonary:     Effort: Pulmonary effort is normal. No tachypnea or respiratory distress.     Breath sounds: Normal breath sounds. No decreased breath sounds, wheezing, rhonchi or rales.  Chest:     Chest wall: No tenderness.  Abdominal:     General: Bowel sounds are normal.     Palpations: Abdomen is soft.  Musculoskeletal: Normal range of motion.  Skin:    General: Skin is warm and dry.  Neurological:     Mental Status: She is alert and oriented to person, place, and time.     Coordination: Coordination normal.  Psychiatric:        Behavior: Behavior normal. Behavior is cooperative.        Thought Content: Thought content normal.        Judgment: Judgment normal.        Patient has been counseled extensively about nutrition and exercise as well as the importance of adherence with medications and regular follow-up. The patient was given clear instructions to go to ER or return to medical center if symptoms don't improve, worsen or  new problems develop. The patient verbalized understanding.   Follow-up: Return in about 4 weeks (around 07/22/2018) for weight check.   Gildardo Pounds, FNP-BC [facility] [place], [place]   06/24/2018, 5:14 PM

## 2018-06-25 DIAGNOSIS — R635 Abnormal weight gain: Secondary | ICD-10-CM | POA: Diagnosis not present

## 2018-06-25 DIAGNOSIS — Z01818 Encounter for other preprocedural examination: Secondary | ICD-10-CM | POA: Diagnosis not present

## 2018-06-25 LAB — CMP14+EGFR
ALK PHOS: 75 IU/L (ref 39–117)
ALT: 77 IU/L — AB (ref 0–32)
AST: 36 IU/L (ref 0–40)
Albumin/Globulin Ratio: 1.5 (ref 1.2–2.2)
Albumin: 3.8 g/dL (ref 3.5–5.5)
BUN/Creatinine Ratio: 13 (ref 9–23)
BUN: 11 mg/dL (ref 6–24)
Bilirubin Total: 0.7 mg/dL (ref 0.0–1.2)
CO2: 26 mmol/L (ref 20–29)
CREATININE: 0.87 mg/dL (ref 0.57–1.00)
Calcium: 9.3 mg/dL (ref 8.7–10.2)
Chloride: 101 mmol/L (ref 96–106)
GFR calc Af Amer: 94 mL/min/{1.73_m2} (ref >59)
GFR calc non Af Amer: 81 mL/min/{1.73_m2} (ref >59)
GLUCOSE: 99 mg/dL (ref 65–99)
Globulin, Total: 2.5 g/dL (ref 1.5–4.5)
Potassium: 4.2 mmol/L (ref 3.5–5.2)
Sodium: 139 mmol/L (ref 134–144)
Total Protein: 6.3 g/dL (ref 6.0–8.5)

## 2018-06-25 LAB — FSH/LH
FSH: 14.2 m[IU]/mL
LH: 15.1 m[IU]/mL

## 2018-06-25 LAB — LIPID PANEL
CHOLESTEROL TOTAL: 179 mg/dL (ref 100–199)
Chol/HDL Ratio: 3.1 ratio (ref 0.0–4.4)
HDL: 58 mg/dL (ref >39)
LDL Calculated: 92 mg/dL (ref 0–99)
TRIGLYCERIDES: 147 mg/dL (ref 0–149)
VLDL CHOLESTEROL CAL: 29 mg/dL (ref 5–40)

## 2018-06-25 LAB — CBC
Hematocrit: 35.9 % (ref 34.0–46.6)
Hemoglobin: 11.7 g/dL (ref 11.1–15.9)
MCH: 29.3 pg (ref 26.6–33.0)
MCHC: 32.6 g/dL (ref 31.5–35.7)
MCV: 90 fL (ref 79–97)
Platelets: 275 10*3/uL (ref 150–450)
RBC: 4 x10E6/uL (ref 3.77–5.28)
RDW: 12.2 % (ref 11.7–15.4)
WBC: 7.2 10*3/uL (ref 3.4–10.8)

## 2018-06-25 LAB — TSH: TSH: 1.96 u[IU]/mL (ref 0.450–4.500)

## 2018-07-06 ENCOUNTER — Encounter (HOSPITAL_BASED_OUTPATIENT_CLINIC_OR_DEPARTMENT_OTHER): Payer: Medicaid Other

## 2018-07-21 ENCOUNTER — Other Ambulatory Visit: Payer: Self-pay

## 2018-07-21 ENCOUNTER — Encounter (INDEPENDENT_AMBULATORY_CARE_PROVIDER_SITE_OTHER): Payer: Self-pay | Admitting: Primary Care

## 2018-07-21 ENCOUNTER — Ambulatory Visit (INDEPENDENT_AMBULATORY_CARE_PROVIDER_SITE_OTHER): Payer: Commercial Managed Care - PPO | Admitting: Primary Care

## 2018-07-21 VITALS — BP 104/66 | HR 83 | Temp 98.0°F | Ht 67.0 in | Wt 320.0 lb

## 2018-07-21 DIAGNOSIS — G4733 Obstructive sleep apnea (adult) (pediatric): Secondary | ICD-10-CM | POA: Diagnosis not present

## 2018-07-21 DIAGNOSIS — Z6841 Body Mass Index (BMI) 40.0 and over, adult: Secondary | ICD-10-CM | POA: Diagnosis not present

## 2018-07-21 DIAGNOSIS — R0609 Other forms of dyspnea: Secondary | ICD-10-CM | POA: Diagnosis not present

## 2018-07-21 DIAGNOSIS — J45990 Exercise induced bronchospasm: Secondary | ICD-10-CM | POA: Diagnosis not present

## 2018-07-21 MED ORDER — VITAMIN D (ERGOCALCIFEROL) 1.25 MG (50000 UNIT) PO CAPS: 50000.0000 [IU] | ORAL_CAPSULE | ORAL | 3 refills | Status: AC

## 2018-07-21 MED ORDER — ALBUTEROL SULFATE (2.5 MG/3ML) 0.083% IN NEBU: 2.5000 mg | INHALATION_SOLUTION | Freq: Four times a day (QID) | RESPIRATORY_TRACT | 1 refills | Status: DC | PRN

## 2018-07-21 MED ORDER — PHENTERMINE HCL 37.5 MG PO TABS: 37.5000 mg | ORAL_TABLET | Freq: Every day | ORAL | 1 refills | Status: DC

## 2018-07-21 NOTE — Progress Notes (Signed)
Established Patient Office Visit  Subjective:  Patient ID: [name], female    DOB: 08-28-1975  Age: 43 y.o. MRN: [id number]  CC: No chief complaint on file.   HPI [facility] [place] presents for weight management. Monthly weights, exercise and diet modification. She has lost 20lbs in 30 days.  Refilled medication. She has completely stop smoking and feels a lot better. We discuss the need for tentus, flu and pap on 1 month f/u visit.     Past Medical History:  Diagnosis Date  . Cervical dysplasia 1993   . Migraine   . Obesity     Past Surgical History:  Procedure Laterality Date  . CESAREAN SECTION     x 4  . COLPOSCOPY    . TUBAL LIGATION Bilateral 2006  . UMBILICAL HERNIA REPAIR  2007    Family History  Problem Relation Age of Onset  . Ovarian cancer Mother 71  . Heart failure Mother   . Depression Mother   . Cancer Mother        uterine cancer   . Heart failure Father 32       MI  . Diabetes Father        type 2  . Hypertension Father   . Heart disease Sister     Social History   Socioeconomic History  . Marital status: Single    Spouse name: Not on file  . Number of children: 4  . Years of education: Not on file  . Highest education level: Not on file  Occupational History    Employer: [employer]  . Financial resource strain: Not on file  . Food insecurity:    Worry: Not on file    Inability: Not on file  . Transportation needs:    Medical: Not on file    Non-medical: Not on file  Tobacco Use  . Smoking status: Current Every Day Smoker    Packs/day: 0.50  . Smokeless tobacco: Never Used  Substance and Sexual Activity  . Alcohol use: No    Alcohol/week: 0.0 standard drinks  . Drug use: No  . Sexual activity: Yes  Lifestyle  . Physical activity:    Days per week: Not on file    Minutes per session: Not on file  . Stress: Not on file  Relationships  . Social connections:    Talks on phone: Not on file    Gets together:  Not on file    Attends religious service: Not on file    Active member of club or organization: Not on file    Attends meetings of clubs or organizations: Not on file    Relationship status: Not on file  . Intimate partner violence:    Fear of current or ex partner: Not on file    Emotionally abused: Not on file    Physically abused: Not on file    Forced sexual activity: Not on file  Other Topics Concern  . Not on file  Social History Narrative  . Not on file    Outpatient Medications Prior to Visit  Medication Sig Dispense Refill  . phentermine (ADIPEX-P) 37.5 MG tablet Take 1 tablet (37.5 mg total) by mouth daily before breakfast for 30 days. 30 tablet 0  . Vitamin D, Ergocalciferol, (DRISDOL) 1.25 MG (50000 UT) CAPS capsule Take by mouth.     No facility-administered medications prior to visit.     Allergies  Allergen Reactions  . Iodinated  Diagnostic Agents Other (See Comments) and Nausea And Vomiting    Pt became hypotensive,lightheaded,near syncopal the last 2 times she had contrast( prior to 2007)////////NEW UPDATE, COUGHING, TROUBLE BREATHING AND HIVES PER PT//A.CALHOUN  . Morphine And Related Itching    ROS Review of Systems  Constitutional: Negative.   HENT: Negative.   Eyes: Negative.   Respiratory: Negative.   Cardiovascular: Negative.   Gastrointestinal: Negative.   Endocrine: Negative.   Genitourinary: Negative.   Musculoskeletal: Negative.   Skin: Negative.   Allergic/Immunologic: Negative.   Neurological: Negative.   Hematological: Negative.   Psychiatric/Behavioral: Negative.       Objective:    Physical Exam  BP 104/66 (BP Location: Left Arm, Patient Position: Sitting, Cuff Size: Large)   Pulse 83   Temp 98 F (36.7 C) (Oral)   Ht 5\' 7"  (1.702 m)   Wt (!) 320 lb (145.2 kg)   SpO2 100%   BMI 50.12 kg/m  Wt Readings from Last 3 Encounters:  07/21/18 (!) 320 lb (145.2 kg)  06/24/18 (!) 342 lb (155.1 kg)  05/22/18 (!) 340 lb (154.2 kg)      Health Maintenance Due  Topic Date Due  . TETANUS/TDAP  06/10/2000  . PAP SMEAR-Modifier  06/12/2015  . INFLUENZA VACCINE  01/08/2018    There are no preventive care reminders to display for this patient.  Lab Results  Component Value Date   TSH 1.960 06/24/2018   Lab Results  Component Value Date   WBC 7.2 06/24/2018   HGB 11.7 06/24/2018   HCT 35.9 06/24/2018   MCV 90 06/24/2018   PLT 275 06/24/2018   Lab Results  Component Value Date   NA 139 06/24/2018   K 4.2 06/24/2018   CO2 26 06/24/2018   GLUCOSE 99 06/24/2018   BUN 11 06/24/2018   CREATININE 0.87 06/24/2018   BILITOT 0.7 06/24/2018   ALKPHOS 75 06/24/2018   AST 36 06/24/2018   ALT 77 (H) 06/24/2018   PROT 6.3 06/24/2018   ALBUMIN 3.8 06/24/2018   CALCIUM 9.3 06/24/2018   ANIONGAP 7 12/15/2017   Lab Results  Component Value Date   CHOL 179 06/24/2018   Lab Results  Component Value Date   HDL 58 06/24/2018   Lab Results  Component Value Date   LDLCALC 92 06/24/2018   Lab Results  Component Value Date   TRIG 147 06/24/2018   Lab Results  Component Value Date   CHOLHDL 3.1 06/24/2018   Lab Results  Component Value Date   HGBA1C 5.0 01/09/2018      Assessment & Plan:  C[place]rie was seen today for follow-up.  Diagnoses and all orders for this visit:  Exertional dyspnea patient can walk 2 miles at her pace  Without dyspnea however if if over excert sinduce induce he has ePxercise   Morbid obesity ([place])  Lost 20 lbs with phenterimene and exercise   Mild obstructive sleep apnea Followed by a pulmonologist she received C-pap today    Problem List Items Addressed This Visit    None      Medications refilled   Follow-up in 1 month   Kerin Perna, NP

## 2018-08-11 ENCOUNTER — Encounter: Payer: Medicaid Other | Admitting: Family Medicine

## 2018-08-13 ENCOUNTER — Ambulatory Visit (HOSPITAL_COMMUNITY)
Admission: EM | Admit: 2018-08-13 | Discharge: 2018-08-13 | Disposition: A | Discharge status: Home / Self Care | Payer: Medicaid Other | Attending: Family Medicine | Admitting: Family Medicine

## 2018-08-13 ENCOUNTER — Encounter (HOSPITAL_COMMUNITY): Payer: Self-pay

## 2018-08-13 ENCOUNTER — Other Ambulatory Visit: Payer: Self-pay

## 2018-08-13 DIAGNOSIS — R197 Diarrhea, unspecified: Secondary | ICD-10-CM

## 2018-08-13 NOTE — Discharge Instructions (Addendum)
Pick up Imodium at pharmacy Also pick up probiotic such as align

## 2018-08-13 NOTE — ED Provider Notes (Signed)
[facility]    [name]: [id number] Arrival date & time: 08/13/18  1934     History   Chief Complaint Chief Complaint  Patient presents with  . Diarrhea    HPI [name] is a 43 y.o. female.   Patient has 1 week history of diarrhea.  She has had no travel and no recent antibiotic use.  She is on a weight loss program including phentermine and protein milkshakes.  She denies any abdominal pain fever or chills.  There is been no blood in the diarrhea.  HPI  Past Medical History:  Diagnosis Date  . Cervical dysplasia 1993   . Migraine   . Obesity     Patient Active Problem List   Diagnosis Date Noted  . Exercise-induced asthma 07/21/2018  . Exertional dyspnea 12/15/2017  . Elevated troponin   . Mild obstructive sleep apnea 02/14/2015  . Snoring 11/14/2014  . Inguinal adenopathy 10/31/2014  . Positive D dimer 10/17/2014  . Pedal edema 10/14/2014  . Fatigue 10/14/2014  . Morbid obesity ([place]) 08/07/2006  . TOBACCO DEPENDENCE 08/07/2006  . MIGRAINE, UNSPEC., W/O INTRACTABLE MIGRAINE 08/07/2006  . RHINITIS, ALLERGIC 08/07/2006    Past Surgical History:  Procedure Laterality Date  . CESAREAN SECTION     x 4  . COLPOSCOPY    . TUBAL LIGATION Bilateral 2006  . UMBILICAL HERNIA REPAIR  2007    OB History    Gravida  6   Para  4   Term  4   Preterm      AB  2   Living  4     SAB  1   TAB  1   Ectopic      Multiple      Live Births  4            Home Medications    Prior to Admission medications   Medication Sig Start Date End Date Taking? Authorizing Provider  albuterol (PROVENTIL) (2.5 MG/3ML) 0.083% nebulizer solution Take 3 mLs (2.5 mg total) by nebulization every 6 (six) hours as needed for wheezing or shortness of breath. 07/21/18   Kerin Perna, NP  phentermine (ADIPEX-P) 37.5 MG tablet Take 1 tablet (37.5 mg total) by mouth daily before breakfast for 30 days. 07/21/18 08/20/18  Kerin Perna, NP  Vitamin D,  Ergocalciferol, (DRISDOL) 1.25 MG (50000 UT) CAPS capsule Take 1 capsule (50,000 Units total) by mouth every 7 (seven) days. 07/21/18 10/19/18  Kerin Perna, NP    Family History Family History  Problem Relation Age of Onset  . Ovarian cancer Mother 81  . Heart failure Mother   . Depression Mother   . Cancer Mother        uterine cancer   . Heart failure Father 65       MI  . Diabetes Father        type 2  . Hypertension Father   . Heart disease Sister     Social History Social History   Tobacco Use  . Smoking status: Current Every Day Smoker    Packs/day: 0.50  . Smokeless tobacco: Never Used  Substance Use Topics  . Alcohol use: No    Alcohol/week: 0.0 standard drinks  . Drug use: No     Allergies   Iodinated diagnostic agents and Morphine and related   Review of Systems Review of Systems  Gastrointestinal: Positive for diarrhea. Negative for abdominal pain and blood in stool.  All other  systems reviewed and are negative.    Physical Exam Triage Vital Signs ED Triage Vitals  Enc Vitals Group     BP 08/13/18 1953 114/64     Pulse Rate 08/13/18 1953 84     Resp 08/13/18 1953 18     Temp 08/13/18 1953 97.8 F (36.6 C)     Temp src --      SpO2 08/13/18 1953 100 %     Weight 08/13/18 1954 (!) 317 lb (143.8 kg)     Height --      Head Circumference --      Peak Flow --      Pain Score 08/13/18 1954 6     Pain Loc --      Pain Edu? --      Excl. in [place]? --    No data found.  Updated Vital Signs BP 114/64 (BP Location: Right Arm)   Pulse 84   Temp 97.8 F (36.6 C)   Resp 18   Wt (!) 143.8 kg   LMP 01/14/2018   SpO2 100%   BMI 49.65 kg/m   Visual Acuity Right Eye Distance:   Left Eye Distance:   Bilateral Distance:    Right Eye Near:   Left Eye Near:    Bilateral Near:     Physical Exam Constitutional:      Appearance: Normal appearance. She is obese.  Cardiovascular:     Rate and Rhythm: Normal rate and regular rhythm.    Pulmonary:     Breath sounds: Normal breath sounds.  Abdominal:     Palpations: Abdomen is soft.     Tenderness: There is no abdominal tenderness. There is no guarding or rebound.  Neurological:     General: No focal deficit present.     Mental Status: She is oriented to person, place, and time.      UC Treatments / Results  Labs (all labs ordered are listed, but only abnormal results are displayed) Labs Reviewed - No data to display  EKG None  Radiology No results found.  Procedures Procedures (including critical care time)  Medications Ordered in UC Medications - No data to display  Initial Impression / Assessment and Plan / UC Course  I have reviewed the triage vital signs and the nursing notes.  Pertinent labs & imaging results that were available during my care of the patient were reviewed by me and considered in my medical decision making (see chart for details).     Diarrhea, uncertain etiology.  Will obtain stool cultures.  Begin probiotic and use Imodium to slow motility Final Clinical Impressions(s) / UC Diagnoses   Final diagnoses:  None   Discharge Instructions   None    ED Prescriptions    None     Controlled Substance Prescriptions [place] Controlled Substance Registry consulted? No   Wardell Honour, MD 08/13/18 2038

## 2018-08-13 NOTE — ED Triage Notes (Signed)
Pt cc diarrhea x 1 week. Pt states she feels bloated.

## 2018-08-18 ENCOUNTER — Ambulatory Visit (INDEPENDENT_AMBULATORY_CARE_PROVIDER_SITE_OTHER): Payer: Commercial Managed Care - PPO | Admitting: Primary Care

## 2018-08-18 ENCOUNTER — Encounter (INDEPENDENT_AMBULATORY_CARE_PROVIDER_SITE_OTHER): Payer: Self-pay | Admitting: Primary Care

## 2018-08-18 ENCOUNTER — Other Ambulatory Visit: Payer: Self-pay

## 2018-08-18 VITALS — BP 109/67 | HR 90 | Temp 98.1°F | Ht 67.0 in | Wt 317.6 lb

## 2018-08-18 DIAGNOSIS — Z6841 Body Mass Index (BMI) 40.0 and over, adult: Secondary | ICD-10-CM | POA: Diagnosis not present

## 2018-08-18 DIAGNOSIS — Z23 Encounter for immunization: Secondary | ICD-10-CM

## 2018-08-18 MED ORDER — PHENTERMINE HCL 37.5 MG PO TABS: 37.5000 mg | ORAL_TABLET | Freq: Every day | ORAL | 1 refills | Status: DC

## 2018-08-18 NOTE — Progress Notes (Signed)
Acute Office Visit  Subjective:    Patient ID: [name], female    DOB: [birth year], 43 y.o.   MRN: [id number]  Chief Complaint  Patient presents with  . Weight Check    HPI Patient is in today for weight check however she has been unable to take phenmetrazine for over a week or exercise to to a viral infection with n/v and diarrhea. She has lost 3 lbs.   Past Medical History:  Diagnosis Date  . Cervical dysplasia 1993   . Migraine   . Obesity     Past Surgical History:  Procedure Laterality Date  . CESAREAN SECTION     x 4  . COLPOSCOPY    . TUBAL LIGATION Bilateral 2006  . UMBILICAL HERNIA REPAIR  2007    Family History  Problem Relation Age of Onset  . Ovarian cancer Mother 62  . Heart failure Mother   . Depression Mother   . Cancer Mother        uterine cancer   . Heart failure Father 11       MI  . Diabetes Father        type 2  . Hypertension Father   . Heart disease Sister     Social History   Socioeconomic History  . Marital status: Single    Spouse name: Not on file  . Number of children: 4  . Years of education: Not on file  . Highest education level: Not on file  Occupational History    Employer: [employer]  . Financial resource strain: Not on file  . Food insecurity:    Worry: Not on file    Inability: Not on file  . Transportation needs:    Medical: Not on file    Non-medical: Not on file  Tobacco Use  . Smoking status: Current Every Day Smoker    Packs/day: 0.50  . Smokeless tobacco: Never Used  Substance and Sexual Activity  . Alcohol use: No    Alcohol/week: 0.0 standard drinks  . Drug use: No  . Sexual activity: Yes  Lifestyle  . Physical activity:    Days per week: Not on file    Minutes per session: Not on file  . Stress: Not on file  Relationships  . Social connections:    Talks on phone: Not on file    Gets together: Not on file    Attends religious service: Not on file    Active member of club  or organization: Not on file    Attends meetings of clubs or organizations: Not on file    Relationship status: Not on file  . Intimate partner violence:    Fear of current or ex partner: Not on file    Emotionally abused: Not on file    Physically abused: Not on file    Forced sexual activity: Not on file  Other Topics Concern  . Not on file  Social History Narrative  . Not on file    Outpatient Medications Prior to Visit  Medication Sig Dispense Refill  . Vitamin D, Ergocalciferol, (DRISDOL) 1.25 MG (50000 UT) CAPS capsule Take 1 capsule (50,000 Units total) by mouth every 7 (seven) days. 5 capsule 3  . phentermine (ADIPEX-P) 37.5 MG tablet Take 1 tablet (37.5 mg total) by mouth daily before breakfast for 30 days. 30 tablet 1  . albuterol (PROVENTIL) (2.5 MG/3ML) 0.083% nebulizer solution Take 3 mLs (2.5 mg total) by nebulization  every 6 (six) hours as needed for wheezing or shortness of breath. 150 mL 1   No facility-administered medications prior to visit.     Allergies  Allergen Reactions  . Iodinated Diagnostic Agents Other (See Comments) and Nausea And Vomiting    Pt became hypotensive,lightheaded,near syncopal the last 2 times she had contrast( prior to 2007)////////NEW UPDATE, COUGHING, TROUBLE BREATHING AND HIVES PER PT//A.CALHOUN  . Morphine And Related Itching    Review of Systems  Constitutional: Negative.   HENT: Negative.   Eyes: Negative.   Respiratory: Negative.   Cardiovascular: Negative.   Gastrointestinal: Positive for nausea.  Genitourinary: Negative.   Skin: Negative.   Endo/Heme/Allergies: Negative.        Objective:    Physical Exam  Constitutional: She appears well-developed and well-nourished.  HENT:  Head: Normocephalic.  Neck: Normal range of motion.  Cardiovascular: Normal rate and regular rhythm.  Pulmonary/Chest: Effort normal and breath sounds normal.  Abdominal: Soft. Bowel sounds are normal. She exhibits distension.   Musculoskeletal: Normal range of motion.  Neurological: She is alert.  Skin: Skin is warm and dry.  Psychiatric: She has a normal mood and affect.    BP 109/67 (BP Location: Right Arm, Patient Position: Sitting, Cuff Size: Large)   Pulse 90   Temp 98.1 F (36.7 C) (Oral)   Ht 5\' 7"  (1.702 m)   Wt (!) 317 lb 9.6 oz (144.1 kg)   SpO2 100%   BMI 49.74 kg/m  Wt Readings from Last 3 Encounters:  08/18/18 (!) 317 lb 9.6 oz (144.1 kg)  08/13/18 (!) 317 lb (143.8 kg)  07/21/18 (!) 320 lb (145.2 kg)    Health Maintenance Due  Topic Date Due  . TETANUS/TDAP  06/10/2000  . PAP SMEAR-Modifier  06/12/2015    There are no preventive care reminders to display for this patient.   Lab Results  Component Value Date   TSH 1.960 06/24/2018   Lab Results  Component Value Date   WBC 7.2 06/24/2018   HGB 11.7 06/24/2018   HCT 35.9 06/24/2018   MCV 90 06/24/2018   PLT 275 06/24/2018   Lab Results  Component Value Date   NA 139 06/24/2018   K 4.2 06/24/2018   CO2 26 06/24/2018   GLUCOSE 99 06/24/2018   BUN 11 06/24/2018   CREATININE 0.87 06/24/2018   BILITOT 0.7 06/24/2018   ALKPHOS 75 06/24/2018   AST 36 06/24/2018   ALT 77 (H) 06/24/2018   PROT 6.3 06/24/2018   ALBUMIN 3.8 06/24/2018   CALCIUM 9.3 06/24/2018   ANIONGAP 7 12/15/2017   Lab Results  Component Value Date   CHOL 179 06/24/2018   Lab Results  Component Value Date   HDL 58 06/24/2018   Lab Results  Component Value Date   LDLCALC 92 06/24/2018   Lab Results  Component Value Date   TRIG 147 06/24/2018   Lab Results  Component Value Date   CHOLHDL 3.1 06/24/2018   Lab Results  Component Value Date   HGBA1C 5.0 01/09/2018       Assessment & Plan:   Problem List Items Addressed This Visit    Morbid obesity ([place])   Relevant Medications   phentermine (ADIPEX-P) 37.5 MG tablet    Other Visit Diagnoses    Need for Tdap vaccination    -  Primary   Relevant Orders   Tdap vaccine greater than or  equal to 7yo IM (Completed)    1. Need for Tdap vaccination  completed  - Tdap vaccine greater than or equal to 7yo IM  2. Morbid obesity ([facility]) Down 3 lbs but 1 week off medication due too GI virus.   Meds ordered this encounter  Medications  . phentermine (ADIPEX-P) 37.5 MG tablet    Sig: Take 1 tablet (37.5 mg total) by mouth daily before breakfast for 30 days.    Dispense:  30 tablet    Refill:  1     Kerin Perna, NP

## 2018-09-17 ENCOUNTER — Telehealth: Payer: Self-pay | Admitting: Primary Care

## 2018-09-17 NOTE — Telephone Encounter (Signed)
Camille with [facility] called to get a different prescription for the patient in regards to their cpap supplies such as the filters and mask. States fax # [phone] where prescriptions can be sent to. Please follow up

## 2018-09-21 NOTE — Telephone Encounter (Signed)
Please print this script so it can be sign and fax I print at [facility]

## 2018-09-21 NOTE — Telephone Encounter (Signed)
Patient is needing CPAP supplies.

## 2018-09-22 ENCOUNTER — Telehealth: Payer: Self-pay

## 2018-09-22 NOTE — Telephone Encounter (Signed)
No it has not... I am not sure how to complete the request now that we do not have CPAP association.

## 2018-09-22 NOTE — Telephone Encounter (Signed)
Call placed to patient and confirmed that she has a CPAP machine and all necessary supplies including mask, tubing and filter.   She received the CPAP machine from [facility].

## 2018-09-22 NOTE — Telephone Encounter (Signed)
Thank you! I will print and fax over!

## 2019-01-12 ENCOUNTER — Telehealth (INDEPENDENT_AMBULATORY_CARE_PROVIDER_SITE_OTHER): Payer: Commercial Managed Care - PPO | Admitting: Primary Care

## 2019-01-12 ENCOUNTER — Encounter (INDEPENDENT_AMBULATORY_CARE_PROVIDER_SITE_OTHER): Payer: Self-pay | Admitting: Primary Care

## 2019-01-12 DIAGNOSIS — G4733 Obstructive sleep apnea (adult) (pediatric): Secondary | ICD-10-CM | POA: Diagnosis not present

## 2019-01-12 NOTE — Progress Notes (Signed)
Virtual Visit via Telephone Note  I connected with [name] on 01/12/19 at  1:30 PM EDT by telephone and verified that I am speaking with the correct person using two identifiers.   I discussed the limitations, risks, security and privacy concerns of performing an evaluation and management service by telephone and the availability of in person appointments. I also discussed with the patient that there may be a patient responsible charge related to this service. The patient expressed understanding and agreed to proceed.   History of Present Illness: [name] is being seen today for weight loss management 11/17/2018 previously scheduled for duo switch -sleeve and bi pass together. Weight down from 317 to 309 she weights herself every Friday.   Observations/Objective: Review of Systems  All other systems reviewed and are negative.   Assessment and Plan: [name] was seen today for weight loss.  Diagnoses and all orders for this visit:  Morbid obesity ([place]) duo switch -sleeve and bi pass together- postpone due to elective surgery. She states she has passed mental evaluation and diet/nutrition.  Mild obstructive sleep apnea She does have apnea. She history of symptoms of OSA and uses a pap nightly.  Patient generally sleeps well at night.    Follow Up Instructions:    I discussed the assessment and treatment plan with the patient. The patient was provided an opportunity to ask questions and all were answered. The patient agreed with the plan and demonstrated an understanding of the instructions.   The patient was advised to call back or seek an in-person evaluation if the symptoms worsen or if the condition fails to improve as anticipated.  I provided  13 minutes of non-face-to-face time during this encounter.   Kerin Perna, NP

## 2019-03-31 ENCOUNTER — Encounter (HOSPITAL_COMMUNITY): Payer: Self-pay

## 2019-03-31 ENCOUNTER — Other Ambulatory Visit: Payer: Self-pay

## 2019-03-31 ENCOUNTER — Ambulatory Visit (INDEPENDENT_AMBULATORY_CARE_PROVIDER_SITE_OTHER): Payer: Medicaid Other

## 2019-03-31 ENCOUNTER — Ambulatory Visit (HOSPITAL_COMMUNITY)
Admission: EM | Admit: 2019-03-31 | Discharge: 2019-03-31 | Disposition: A | Discharge status: Home / Self Care | Payer: Medicaid Other | Attending: Family Medicine | Admitting: Family Medicine

## 2019-03-31 DIAGNOSIS — J22 Unspecified acute lower respiratory infection: Secondary | ICD-10-CM

## 2019-03-31 DIAGNOSIS — Z20822 Contact with and (suspected) exposure to covid-19: Secondary | ICD-10-CM

## 2019-03-31 DIAGNOSIS — J209 Acute bronchitis, unspecified: Secondary | ICD-10-CM | POA: Diagnosis not present

## 2019-03-31 DIAGNOSIS — Z20828 Contact with and (suspected) exposure to other viral communicable diseases: Secondary | ICD-10-CM

## 2019-03-31 MED ORDER — AZITHROMYCIN 250 MG PO TABS: 250.0000 mg | ORAL_TABLET | Freq: Every day | ORAL | 0 refills | Status: DC

## 2019-03-31 MED ORDER — BENZONATATE 200 MG PO CAPS: 200.0000 mg | ORAL_CAPSULE | Freq: Three times a day (TID) | ORAL | 0 refills | Status: AC | PRN

## 2019-03-31 MED ORDER — ALBUTEROL SULFATE HFA 108 (90 BASE) MCG/ACT IN AERS: 1.0000 | INHALATION_SPRAY | Freq: Four times a day (QID) | RESPIRATORY_TRACT | 0 refills | Status: DC | PRN

## 2019-03-31 MED ORDER — HYDROCODONE-HOMATROPINE 5-1.5 MG/5ML PO SYRP: 5.0000 mL | ORAL_SOLUTION | Freq: Every day | ORAL | 0 refills | Status: DC

## 2019-03-31 MED ORDER — PREDNISONE 50 MG PO TABS: 50.0000 mg | ORAL_TABLET | Freq: Every day | ORAL | 0 refills | Status: AC

## 2019-03-31 NOTE — ED Triage Notes (Signed)
Pt presents to UC w/ c/o cough and fever x3 days. Pt states she uses pillows at night and is unable to use her cpap machine d/t covid concerns. Pt states coughing gets worse at night.

## 2019-03-31 NOTE — Discharge Instructions (Signed)
Chest xray without pneumonia I am treating you for bronchitis Begin azithromycin-2 tablets today, 1 tablet for the following 4 days Prednisone daily with food in the morning Tessalon for cough as needed every 8 hours May use cough syrup at bedtime, will cause drowsiness, do not drive or work after taking  Please continue to monitor and follow-up if symptoms not resolving or worsening, developing fevers, difficulty breathing, shortness of breath

## 2019-03-31 NOTE — ED Provider Notes (Signed)
[facility]    [name]: [id number] Arrival date & time: 03/31/19  1014      History   Chief Complaint No chief complaint on file. Cough  HPI [name] is a 43 y.o. female history of obesity, tobacco use, sleep apnea, presenting today for evaluation of cough.  Patient states that for the past 2 weeks she has had a cough.  Over the past 4 to 5 days she has had subjective fevers with associated hot and cold chills.  She has been using Tylenol for this which has helped her symptoms.  Believes fever broke on Monday, 2 days ago.  She is also had worsening cough over the past 4 to 5 days as well.  Cannot take a deep breath without coughing.  Cough worse at night.  Having difficulty sleeping due to coughing spells.  Denies any known exposure to Covid, but patient does work as a Recruitment consultant for [employer].  Maintaining her appetite.  Has been using Robitussin, DayQuil and NyQuil without relief.  HPI  Past Medical History:  Diagnosis Date   Cervical dysplasia 1993    Migraine    Obesity     Patient Active Problem List   Diagnosis Date Noted   Exercise-induced asthma 07/21/2018   Exertional dyspnea 12/15/2017   Elevated troponin    Mild obstructive sleep apnea 02/14/2015   Snoring 11/14/2014   Inguinal adenopathy 10/31/2014   Positive D dimer 10/17/2014   Pedal edema 10/14/2014   Fatigue 10/14/2014   Morbid obesity ([place]) 08/07/2006   TOBACCO DEPENDENCE 08/07/2006   MIGRAINE, UNSPEC., W/O INTRACTABLE MIGRAINE 08/07/2006   RHINITIS, ALLERGIC 08/07/2006    Past Surgical History:  Procedure Laterality Date   CESAREAN SECTION     x 4   COLPOSCOPY     TUBAL LIGATION Bilateral 123456   UMBILICAL HERNIA REPAIR  2007    OB History    Gravida  6   Para  4   Term  4   Preterm      AB  2   Living  4     SAB  1   TAB  1   Ectopic      Multiple      Live Births  4            Home Medications    Prior to  Admission medications   Medication Sig Start Date End Date Taking? Authorizing Provider  albuterol (VENTOLIN HFA) 108 (90 Base) MCG/ACT inhaler Inhale 1-2 puffs into the lungs every 6 (six) hours as needed for wheezing or shortness of breath. 03/31/19   Keontre Defino C, PA-C  azithromycin (ZITHROMAX) 250 MG tablet Take 1 tablet (250 mg total) by mouth daily. Take first 2 tablets together, then 1 every day until finished. 03/31/19   Ciarra Braddy C, PA-C  benzonatate (TESSALON) 200 MG capsule Take 1 capsule (200 mg total) by mouth 3 (three) times daily as needed for up to 7 days for cough. 03/31/19 04/07/19  Mikey Maffett C, PA-C  HYDROcodone-homatropine (HYCODAN) 5-1.5 MG/5ML syrup Take 5 mLs by mouth at bedtime. 03/31/19   Ade Stmarie C, PA-C  predniSONE (DELTASONE) 50 MG tablet Take 1 tablet (50 mg total) by mouth daily with breakfast for 5 days. 03/31/19 04/05/19  Macaila Tahir C, PA-C  Vitamin D, Ergocalciferol, (DRISDOL) 1.25 MG (50000 UT) CAPS capsule Take by mouth. 07/02/18   [provider]  phentermine (ADIPEX-P) 37.5 MG tablet Take 1 tablet (37.5 mg  total) by mouth daily before breakfast for 30 days. 08/18/18 03/31/19  Kerin Perna, NP    Family History Family History  Problem Relation Age of Onset   Ovarian cancer Mother 91   Heart failure Mother    Depression Mother    Cancer Mother        uterine cancer    Heart failure Father 11       MI   Diabetes Father        type 2   Hypertension Father    Heart disease Sister     Social History Social History   Tobacco Use   Smoking status: Former Smoker    Packs/day: 0.50    Quit date: 06/08/2018    Years since quitting: 0.8   Smokeless tobacco: Never Used  Substance Use Topics   Alcohol use: No    Alcohol/week: 0.0 standard drinks   Drug use: No     Allergies   Iodinated diagnostic agents and Morphine and related   Review of Systems Review of Systems  Constitutional: Negative  for activity change, appetite change, chills, fatigue and fever.  HENT: Positive for congestion and rhinorrhea. Negative for ear pain, sinus pressure, sore throat and trouble swallowing.   Eyes: Negative for discharge and redness.  Respiratory: Positive for cough and shortness of breath. Negative for chest tightness.   Cardiovascular: Negative for chest pain.  Gastrointestinal: Negative for abdominal pain, diarrhea, nausea and vomiting.  Musculoskeletal: Negative for myalgias.  Skin: Negative for rash.  Neurological: Negative for dizziness, light-headedness and headaches.     Physical Exam Triage Vital Signs ED Triage Vitals  Enc Vitals Group     BP 03/31/19 1040 (!) 133/96     Pulse Rate 03/31/19 1034 83     Resp 03/31/19 1034 16     Temp 03/31/19 1040 98.9 F (37.2 C)     Temp Source 03/31/19 1034 Oral     SpO2 03/31/19 1034 99 %     Weight --      Height --      Head Circumference --      Peak Flow --      Pain Score 03/31/19 1037 0     Pain Loc --      Pain Edu? --      Excl. in [place]? --    No data found.  Updated Vital Signs BP (!) 133/96 (BP Location: Left Wrist)    Pulse 83    Temp 98.9 F (37.2 C) (Oral)    Resp 16    LMP 03/06/2019    SpO2 99%   Visual Acuity Right Eye Distance:   Left Eye Distance:   Bilateral Distance:    Right Eye Near:   Left Eye Near:    Bilateral Near:     Physical Exam Vitals signs and nursing note reviewed.  Constitutional:      General: She is not in acute distress.    Appearance: She is well-developed.  HENT:     Head: Normocephalic and atraumatic.     Ears:     Comments: Bilateral ears without tenderness to palpation of external auricle, tragus and mastoid, EAC's without erythema or swelling, TM's with good bony landmarks and cone of light. Non erythematous.     Nose:     Comments: Nasal mucosa erythematous, nonswollen turbinates    Mouth/Throat:     Comments: Oral mucosa pink and moist, no tonsillar enlargement or  exudate. Posterior pharynx patent and  nonerythematous, no uvula deviation or swelling. Normal phonation.  Eyes:     Conjunctiva/sclera: Conjunctivae normal.  Neck:     Musculoskeletal: Neck supple.  Cardiovascular:     Rate and Rhythm: Normal rate and regular rhythm.     Heart sounds: No murmur.  Pulmonary:     Effort: Pulmonary effort is normal. No respiratory distress.     Comments: Frequent coarse coughing with deep inspiration, no adventitious sounds auscultated when not coughing, no audible wheezing or crackles. Abdominal:     Palpations: Abdomen is soft.     Tenderness: There is no abdominal tenderness.  Skin:    General: Skin is warm and dry.  Neurological:     Mental Status: She is alert.      UC Treatments / Results  Labs (all labs ordered are listed, but only abnormal results are displayed) Labs Reviewed  NOVEL CORONAVIRUS, NAA (HOSP ORDER, SEND-OUT TO REF LAB; TAT 18-24 HRS)    EKG   Radiology Dg Chest 2 View  Result Date: 03/31/2019 CLINICAL DATA:  Cough for approximately 2 weeks. EXAM: CHEST - 2 VIEW COMPARISON:  December 14, 2017 FINDINGS: Lungs are clear. Heart size and pulmonary vascularity are normal. No adenopathy. No pneumothorax. No bone lesions. IMPRESSION: No edema or consolidation. Electronically Signed   By: Lowella Grip III M.D.   On: 03/31/2019 11:18    Procedures Procedures (including critical care time)  Medications Ordered in UC Medications - No data to display  Initial Impression / Assessment and Plan / UC Course  I have reviewed the triage vital signs and the nursing notes.  Pertinent labs & imaging results that were available during my care of the patient were reviewed by me and considered in my medical decision making (see chart for details).     Chest x-ray negative for pneumonia.  Covid pending.  Given length of symptoms and nature of cough during visit will treat for bronchitis.  Providing azithromycin, albuterol, prednisone.   Provided Tessalon for cough during the day, Hycodan for nighttime cough.  States that she is tolerated Tussionex cough syrup previously despite her allergy to morphine.Discussed strict return precautions. Patient verbalized understanding and is agreeable with plan.  Final Clinical Impressions(s) / UC Diagnoses   Final diagnoses:  Suspected COVID-19 virus infection  Lower resp. tract infection     Discharge Instructions     Chest xray without pneumonia I am treating you for bronchitis Begin azithromycin-2 tablets today, 1 tablet for the following 4 days Prednisone daily with food in the morning Tessalon for cough as needed every 8 hours May use cough syrup at bedtime, will cause drowsiness, do not drive or work after taking  Please continue to monitor and follow-up if symptoms not resolving or worsening, developing fevers, difficulty breathing, shortness of breath    ED Prescriptions    Medication Sig Dispense Auth. Provider   HYDROcodone-homatropine (HYCODAN) 5-1.5 MG/5ML syrup Take 5 mLs by mouth at bedtime. 70 mL Nishka Heide C, PA-C   benzonatate (TESSALON) 200 MG capsule Take 1 capsule (200 mg total) by mouth 3 (three) times daily as needed for up to 7 days for cough. 28 capsule Aadvik Roker C, PA-C   azithromycin (ZITHROMAX) 250 MG tablet Take 1 tablet (250 mg total) by mouth daily. Take first 2 tablets together, then 1 every day until finished. 6 tablet Divon Krabill C, PA-C   predniSONE (DELTASONE) 50 MG tablet Take 1 tablet (50 mg total) by mouth daily with breakfast for 5  days. 5 tablet Leanora Murin C, PA-C   albuterol (VENTOLIN HFA) 108 (90 Base) MCG/ACT inhaler Inhale 1-2 puffs into the lungs every 6 (six) hours as needed for wheezing or shortness of breath. 8 g Juston Goheen, Amistad C, PA-C     I have reviewed the PDMP during this encounter.   Janith Lima, PA-C 03/31/19 1137

## 2019-04-01 LAB — NOVEL CORONAVIRUS, NAA (HOSP ORDER, SEND-OUT TO REF LAB; TAT 18-24 HRS): SARS-CoV-2, NAA: NOT DETECTED

## 2019-04-05 ENCOUNTER — Telehealth (HOSPITAL_COMMUNITY): Payer: Self-pay | Admitting: Emergency Medicine

## 2019-04-05 NOTE — Telephone Encounter (Signed)
Patient called asking about covid test results, results reported as negative.

## 2019-04-05 NOTE — Telephone Encounter (Signed)
Pt called asking for help to get back into her mychart, she forgot her user name. Pt given name, given results, and work note sent to [employer] for her to return to work.

## 2019-06-08 ENCOUNTER — Ambulatory Visit (INDEPENDENT_AMBULATORY_CARE_PROVIDER_SITE_OTHER): Payer: Medicaid Other | Admitting: Primary Care

## 2019-06-15 ENCOUNTER — Emergency Department (HOSPITAL_COMMUNITY)
Admission: EM | Admit: 2019-06-15 | Discharge: 2019-06-15 | Disposition: A | Discharge status: Home / Self Care | Payer: Medicaid Other | Attending: Emergency Medicine | Admitting: Emergency Medicine

## 2019-06-15 ENCOUNTER — Encounter (HOSPITAL_COMMUNITY): Payer: Self-pay | Admitting: Student

## 2019-06-15 ENCOUNTER — Emergency Department (HOSPITAL_COMMUNITY): Payer: Medicaid Other

## 2019-06-15 ENCOUNTER — Other Ambulatory Visit: Payer: Self-pay

## 2019-06-15 DIAGNOSIS — J45909 Unspecified asthma, uncomplicated: Secondary | ICD-10-CM | POA: Diagnosis not present

## 2019-06-15 DIAGNOSIS — M791 Myalgia, unspecified site: Secondary | ICD-10-CM | POA: Insufficient documentation

## 2019-06-15 DIAGNOSIS — Z20822 Contact with and (suspected) exposure to covid-19: Secondary | ICD-10-CM | POA: Diagnosis not present

## 2019-06-15 DIAGNOSIS — R519 Headache, unspecified: Secondary | ICD-10-CM | POA: Diagnosis present

## 2019-06-15 DIAGNOSIS — Z87891 Personal history of nicotine dependence: Secondary | ICD-10-CM | POA: Insufficient documentation

## 2019-06-15 DIAGNOSIS — R52 Pain, unspecified: Secondary | ICD-10-CM

## 2019-06-15 DIAGNOSIS — Z79899 Other long term (current) drug therapy: Secondary | ICD-10-CM | POA: Insufficient documentation

## 2019-06-15 LAB — POC SARS CORONAVIRUS 2 AG -  ED: SARS Coronavirus 2 Ag: NEGATIVE

## 2019-06-15 MED ORDER — NAPROXEN 500 MG PO TABS: 500.0000 mg | ORAL_TABLET | Freq: Two times a day (BID) | ORAL | 0 refills | Status: DC

## 2019-06-15 MED ORDER — FLUTICASONE PROPIONATE 50 MCG/ACT NA SUSP: 1.0000 | Freq: Every day | NASAL | 0 refills | Status: DC

## 2019-06-15 MED ORDER — BENZONATATE 100 MG PO CAPS: 100.0000 mg | ORAL_CAPSULE | Freq: Three times a day (TID) | ORAL | 0 refills | Status: DC | PRN

## 2019-06-15 NOTE — ED Triage Notes (Signed)
Pt here for evaluation of generalized body aches, loss of taste, and headache since Sunday.. Dental surgery last week so she thought her loss of taste was r/t to that. 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

## 2019-06-15 NOTE — ED Provider Notes (Signed)
[name] Provider Note   [name]: [id number] Arrival date & time: 06/15/19  1534     History Chief Complaint  Patient presents with  . Generalized Body Aches  . Headache    [name] is a 44 y.o. female with a hx of migraines, fatigue, obesity & exercise induced asthma who presents to the ED with complaints of generalized body aches over the past 5-6 days.  Patient states she is had several symptoms including loss of taste, headaches (gradual onset, steady progression, similar to prior), nasal congestion, cough productive of yellow phlegm sputum, generalized body aches, chills, and a few loose stools.  She states her first sx was loss of taste which she attributed to a recent oral surgery but it seemed to persist with development of these additional sxs. Intra-oral surgical sites seem to be healing appropriately - no issues with this. No alleviating/aggravating factors to her sxs.  Denies fevers, chest pain, dyspnea, syncope, or emesis.  No known Covid exposures.  HPI     Past Medical History:  Diagnosis Date  . Cervical dysplasia 1993   . Migraine   . Obesity     Patient Active Problem List   Diagnosis Date Noted  . Exercise-induced asthma 07/21/2018  . Exertional dyspnea 12/15/2017  . Elevated troponin   . Mild obstructive sleep apnea 02/14/2015  . Snoring 11/14/2014  . Inguinal adenopathy 10/31/2014  . Positive D dimer 10/17/2014  . Pedal edema 10/14/2014  . Fatigue 10/14/2014  . Morbid obesity ([place]) 08/07/2006  . TOBACCO DEPENDENCE 08/07/2006  . MIGRAINE, UNSPEC., W/O INTRACTABLE MIGRAINE 08/07/2006  . RHINITIS, ALLERGIC 08/07/2006    Past Surgical History:  Procedure Laterality Date  . CESAREAN SECTION     x 4  . COLPOSCOPY    . TUBAL LIGATION Bilateral 2006  . UMBILICAL HERNIA REPAIR  2007     OB History    Gravida  6   Para  4   Term  4   Preterm      AB  2   Living  4     SAB  1   TAB  1   Ectopic      Multiple      Live Births  4           Family History  Problem Relation Age of Onset  . Ovarian cancer Mother 57  . Heart failure Mother   . Depression Mother   . Cancer Mother        uterine cancer   . Heart failure Father 47       MI  . Diabetes Father        type 2  . Hypertension Father   . Heart disease Sister     Social History   Tobacco Use  . Smoking status: Former Smoker    Packs/day: 0.50    Quit date: 06/08/2018    Years since quitting: 1.0  . Smokeless tobacco: Never Used  Substance Use Topics  . Alcohol use: No    Alcohol/week: 0.0 standard drinks  . Drug use: No    Home Medications Prior to Admission medications   Medication Sig Start Date End Date Taking? Authorizing Provider  albuterol (VENTOLIN HFA) 108 (90 Base) MCG/ACT inhaler Inhale 1-2 puffs into the lungs every 6 (six) hours as needed for wheezing or shortness of breath. 03/31/19   Wieters, Hallie C, PA-C  azithromycin (ZITHROMAX) 250 MG tablet Take 1 tablet (250 mg  total) by mouth daily. Take first 2 tablets together, then 1 every day until finished. 03/31/19   Wieters, Hallie C, PA-C  HYDROcodone-homatropine (HYCODAN) 5-1.5 MG/5ML syrup Take 5 mLs by mouth at bedtime. 03/31/19   Wieters, Hallie C, PA-C  Vitamin D, Ergocalciferol, (DRISDOL) 1.25 MG (50000 UT) CAPS capsule Take by mouth. 07/02/18   [provider]  phentermine (ADIPEX-P) 37.5 MG tablet Take 1 tablet (37.5 mg total) by mouth daily before breakfast for 30 days. 08/18/18 03/31/19  Kerin Perna, NP   Allergies    Iodinated diagnostic agents and Morphine and related  Review of Systems   Review of Systems  Constitutional: Positive for chills. Negative for fever.  HENT: Positive for congestion. Negative for ear pain and sore throat.        Positive for loss of taste  Respiratory: Positive for cough. Negative for shortness of breath.   Cardiovascular: Negative for chest pain.  Gastrointestinal:  Positive for diarrhea. Negative for abdominal pain, anal bleeding, blood in stool and vomiting.  Genitourinary: Negative for dysuria.  Musculoskeletal: Positive for myalgias (generalized).  Neurological: Negative for syncope.    Physical Exam Updated Vital Signs BP (!) 121/95 (BP Location: Right Arm)   Pulse 74   Temp 98.4 F (36.9 C) (Oral)   Resp 18   SpO2 100%   Physical Exam Vitals and nursing note reviewed.  Constitutional:      General: She is not in acute distress.    Appearance: She is well-developed.  HENT:     Head: Normocephalic and atraumatic.     Right Ear: Ear canal normal. Tympanic membrane is not perforated, erythematous, retracted or bulging.     Left Ear: Ear canal normal. Tympanic membrane is not perforated, erythematous, retracted or bulging.     Ears:     Comments: No mastoid erythema/swelling/tenderness.     Nose:     Right Sinus: No maxillary sinus tenderness or frontal sinus tenderness.     Left Sinus: No maxillary sinus tenderness or frontal sinus tenderness.     Mouth/Throat:     Pharynx: Uvula midline. No oropharyngeal exudate or posterior oropharyngeal erythema.     Comments: Posterior oropharynx is symmetric appearing. Patient tolerating own secretions without difficulty. No trismus. No drooling. No hot potato voice. No swelling beneath the tongue, submandibular compartment is soft.  Eyes:     General:        Right eye: No discharge.        Left eye: No discharge.     Conjunctiva/sclera: Conjunctivae normal.     Pupils: Pupils are equal, round, and reactive to light.  Cardiovascular:     Rate and Rhythm: Normal rate and regular rhythm.     Heart sounds: No murmur.  Pulmonary:     Effort: Pulmonary effort is normal. No respiratory distress.     Breath sounds: Normal breath sounds. No wheezing, rhonchi or rales.  Abdominal:     General: There is no distension.     Palpations: Abdomen is soft.     Tenderness: There is no abdominal tenderness.  There is no guarding.  Musculoskeletal:     Cervical back: Normal range of motion and neck supple. No edema, rigidity or crepitus.  Lymphadenopathy:     Cervical: No cervical adenopathy.  Skin:    General: Skin is warm and dry.     Findings: No rash.  Neurological:     Mental Status: She is alert.     Comments: Alert.  Clear speech.  CN III through XII grossly intact. Sensation grossly intact to bilateral bilateral upper and lower extremities.  5 out of 5 symmetric grip strength.  5 strength with plantar dorsiflexion bilaterally.  Patient is ambulatory.  Psychiatric:        Behavior: Behavior normal.     ED Results / Procedures / Treatments   Labs (all labs ordered are listed, but only abnormal results are displayed) Labs Reviewed  POC SARS CORONAVIRUS 2 AG -  ED   EKG None  Radiology No results found.  Procedures Procedures (including critical care time)  Medications Ordered in ED Medications - No data to display  ED Course  I have reviewed the triage vital signs and the nursing notes.  Pertinent labs & imaging results that were available during my care of the patient were reviewed by me and considered in my medical decision making (see chart for details).    Rishita AVERY GINGRAS was evaluated in [name] on 06/15/2019 for the symptoms described in the history of present illness. He/she was evaluated in the context of the global COVID-19 pandemic, which necessitated consideration that the patient might be at risk for infection with the SARS-CoV-2 virus that causes COVID-19. Institutional protocols and algorithms that pertain to the evaluation of patients at risk for COVID-19 are in a state of rapid change based on information released by regulatory bodies including the CDC and federal and state organizations. These policies and algorithms were followed during the patient's care in the ED.  MDM Rules/Calculators/A&P                       Patient presents to the ED with  body aches, loss of taste, headaches, congestion, cough, chills & loose stools.  Patient is nontoxic appearing, in no apparent distress, vitals without significant abnormality. Afebrile, no sinus tenderness, sxs < 10 days, doubt acute bacterial sinusitis. No meningismus. No headache redflags. No neuro deficits. No signs of AOM/AOE/mastoiditis. Oropharynx clear. Lungs CTA. CXR without infiltrate, doubt CAP. Abdomen nontender without peritoneal signs. Rapid covid negative. Will send PCR. Seems viral currently. Supportive care. PCP follow up. I discussed results, treatment plan, need for follow-up, and return precautions with the patient. Provided opportunity for questions, patient confirmed understanding and is in agreement with plan.    Final Clinical Impression(s) / ED Diagnoses Final diagnoses:  Body aches    Rx / DC Orders ED Discharge Orders         Ordered    benzonatate (TESSALON) 100 MG capsule  3 times daily PRN     06/15/19 2059    fluticasone (FLONASE) 50 MCG/ACT nasal spray  Daily     06/15/19 2059    naproxen (NAPROSYN) 500 MG tablet  2 times daily     06/15/19 2059           Burgess Sheriff, Glynda Jaeger, PA-C 06/15/19 2104    Isla Pence, MD 06/15/19 2120

## 2019-06-15 NOTE — Discharge Instructions (Signed)
You were seen in the emergency department today for body aches, headaches, cough, and loss of taste.  Your chest x-ray was normal.  Your rapid Covid swab was negative.  We have sent a more accurate Covid swab and call we will call you with results.  We have tested you for COVID 19, we will call you within the next 72 hours if results are positive, you may also view these results on MyChart.   We are instructing patient's with COVID 19 or symptoms of COVID 19 to quarantine themselves for 14 days. You may be able to discontinue self quarantine if the following conditions are met:   Persons with COVID-19 who have symptoms and were directed to care for themselves at home may discontinue home isolation under the  following conditions: - It has been at least 7 days have passed since symptoms first appeared. - AND at least 3 days (72 hours) have passed since recovery defined as resolution of fever without the use of fever-reducing medications and improvement in respiratory symptoms (e.g., cough, shortness of breath)  Please follow the below quarantine instructions.   We are sending you home with Flonase to use 1 spray per nostril daily to help with congestion, tessalon to take 1 tablet every 8 hours to help with coughing, as well as naproxen which is a nonsteroidal anti-inflammatory medication that will help with pain and swelling. Be sure to take this medication as prescribed with food, 1 pill every 12 hours,  It should be taken with food, as it can cause stomach upset, and more seriously, stomach bleeding. Do not take other nonsteroidal anti-inflammatory medications with this such as Advil, Motrin, Aleve, Mobic, Goodie Powder, or Motrin.    You make take Tylenol per over the counter dosing with these medications.   We have prescribed you new medication(s) today. Discuss the medications prescribed today with your pharmacist as they can have adverse effects and interactions with your other medicines  including over the counter and prescribed medications. Seek medical evaluation if you start to experience new or abnormal symptoms after taking one of these medicines, seek care immediately if you start to experience difficulty breathing, feeling of your throat closing, facial swelling, or rash as these could be indications of a more serious allergic reaction   Please follow up with primary care within 3-5 days for re-evaluation- call prior to going to the office to make them aware of your symptoms as some offices are altering their method of seeing patients with COVID 19 symptoms. Return to the ER for new or worsening symptoms including but not limited to increased work of breathing, increased pain, chest pain, passing out, or any other concerns.       Person Under Monitoring Name: [name]  Location: [place] [address]   Infection Prevention Recommendations for Individuals Confirmed to have, or Being Evaluated for, 2019 Novel Coronavirus (COVID-19) Infection Who Receive Care at Home  Individuals who are confirmed to have, or are being evaluated for, COVID-19 should follow the prevention steps below until a healthcare provider or local or state health department says they can return to normal activities.  Stay home except to get medical care You should restrict activities outside your home, except for getting medical care. Do not go to work, school, or public areas, and do not use public transportation or taxis.  Call ahead before visiting your doctor Before your medical appointment, call the healthcare provider and tell them that you have, or  are being evaluated for, COVID-19 infection. This will help the healthcare provider's office take steps to keep other people from getting infected. Ask your healthcare provider to call the local or state health department.  Monitor your symptoms Seek prompt medical attention if your illness is worsening (e.g., difficulty  breathing). Before going to your medical appointment, call the healthcare provider and tell them that you have, or are being evaluated for, COVID-19 infection. Ask your healthcare provider to call the local or state health department.  Wear a facemask You should wear a facemask that covers your nose and mouth when you are in the same room with other people and when you visit a healthcare provider. People who live with or visit you should also wear a facemask while they are in the same room with you.  Separate yourself from other people in your home As much as possible, you should stay in a different room from other people in your home. Also, you should use a separate bathroom, if available.  Avoid sharing household items You should not share dishes, drinking glasses, cups, eating utensils, towels, bedding, or other items with other people in your home. After using these items, you should wash them thoroughly with soap and water.  Cover your coughs and sneezes Cover your mouth and nose with a tissue when you cough or sneeze, or you can cough or sneeze into your sleeve. Throw used tissues in a lined trash can, and immediately wash your hands with soap and water for at least 20 seconds or use an alcohol-based hand rub.  Wash your [facility] your hands often and thoroughly with soap and water for at least 20 seconds. You can use an alcohol-based hand sanitizer if soap and water are not available and if your hands are not visibly dirty. Avoid touching your eyes, nose, and mouth with unwashed hands.   Prevention Steps for Caregivers and Household Members of Individuals Confirmed to have, or Being Evaluated for, COVID-19 Infection Being Cared for in the Home  If you live with, or provide care at home for, a person confirmed to have, or being evaluated for, COVID-19 infection please follow these guidelines to prevent infection:  Follow healthcare provider's instructions Make sure that you  understand and can help the patient follow any healthcare provider instructions for all care.  Provide for the patient's basic needs You should help the patient with basic needs in the home and provide support for getting groceries, prescriptions, and other personal needs.  Monitor the patient's symptoms If they are getting sicker, call his or her medical provider and tell them that the patient has, or is being evaluated for, COVID-19 infection. This will help the healthcare provider's office take steps to keep other people from getting infected. Ask the healthcare provider to call the local or state health department.  Limit the number of people who have contact with the patient If possible, have only one caregiver for the patient. Other household members should stay in another home or place of residence. If this is not possible, they should stay in another room, or be separated from the patient as much as possible. Use a separate bathroom, if available. Restrict visitors who do not have an essential need to be in the home.  Keep older adults, very young children, and other sick people away from the patient Keep older adults, very young children, and those who have compromised immune systems or chronic health conditions away from the patient. This includes people with  chronic heart, lung, or kidney conditions, diabetes, and cancer.  Ensure good ventilation Make sure that shared spaces in the home have good air flow, such as from an air conditioner or an opened window, weather permitting.  Wash your hands often Wash your hands often and thoroughly with soap and water for at least 20 seconds. You can use an alcohol based hand sanitizer if soap and water are not available and if your hands are not visibly dirty. Avoid touching your eyes, nose, and mouth with unwashed hands. Use disposable paper towels to dry your hands. If not available, use dedicated cloth towels and replace them when they  become wet.  Wear a facemask and gloves Wear a disposable facemask at all times in the room and gloves when you touch or have contact with the patient's blood, body fluids, and/or secretions or excretions, such as sweat, saliva, sputum, nasal mucus, vomit, urine, or feces.  Ensure the mask fits over your nose and mouth tightly, and do not touch it during use. Throw out disposable facemasks and gloves after using them. Do not reuse. Wash your hands immediately after removing your facemask and gloves. If your personal clothing becomes contaminated, carefully remove clothing and launder. Wash your hands after handling contaminated clothing. Place all used disposable facemasks, gloves, and other waste in a lined container before disposing them with other household waste. Remove gloves and wash your hands immediately after handling these items.  Do not share dishes, glasses, or other household items with the patient Avoid sharing household items. You should not share dishes, drinking glasses, cups, eating utensils, towels, bedding, or other items with a patient who is confirmed to have, or being evaluated for, COVID-19 infection. After the person uses these items, you should wash them thoroughly with soap and water.  Wash laundry thoroughly Immediately remove and wash clothes or bedding that have blood, body fluids, and/or secretions or excretions, such as sweat, saliva, sputum, nasal mucus, vomit, urine, or feces, on them. Wear gloves when handling laundry from the patient. Read and follow directions on labels of laundry or clothing items and detergent. In general, wash and dry with the warmest temperatures recommended on the label.  Clean all areas the individual has used often Clean all touchable surfaces, such as counters, tabletops, doorknobs, bathroom fixtures, toilets, phones, keyboards, tablets, and bedside tables, every day. Also, clean any surfaces that may have blood, body fluids, and/or  secretions or excretions on them. Wear gloves when cleaning surfaces the patient has come in contact with. Use a diluted bleach solution (e.g., dilute bleach with 1 part bleach and 10 parts water) or a household disinfectant with a label that says EPA-registered for coronaviruses. To make a bleach solution at home, add 1 tablespoon of bleach to 1 quart (4 cups) of water. For a larger supply, add  cup of bleach to 1 gallon (16 cups) of water. Read labels of cleaning products and follow recommendations provided on product labels. Labels contain instructions for safe and effective use of the cleaning product including precautions you should take when applying the product, such as wearing gloves or eye protection and making sure you have good ventilation during use of the product. Remove gloves and wash hands immediately after cleaning.  Monitor yourself for signs and symptoms of illness Caregivers and household members are considered close contacts, should monitor their health, and will be asked to limit movement outside of the home to the extent possible. Follow the monitoring steps for close contacts listed  on the symptom monitoring form.   ? If you have additional questions, contact your local health department or call the epidemiologist on call at [phone] (available 24/7). ? This guidance is subject to change. For the most up-to-date guidance from [facility], please refer to their website: [email or web address]

## 2019-06-16 ENCOUNTER — Other Ambulatory Visit: Payer: Medicaid Other

## 2019-06-16 ENCOUNTER — Ambulatory Visit (INDEPENDENT_AMBULATORY_CARE_PROVIDER_SITE_OTHER): Payer: Medicaid Other | Admitting: Primary Care

## 2019-06-19 LAB — NOVEL CORONAVIRUS, NAA (HOSP ORDER, SEND-OUT TO REF LAB; TAT 18-24 HRS): SARS-CoV-2, NAA: NOT DETECTED

## 2019-08-03 ENCOUNTER — Ambulatory Visit: Payer: Medicaid Other | Attending: Primary Care

## 2019-08-03 ENCOUNTER — Other Ambulatory Visit: Payer: Self-pay

## 2019-09-16 ENCOUNTER — Other Ambulatory Visit: Payer: Self-pay

## 2019-09-16 ENCOUNTER — Encounter (HOSPITAL_COMMUNITY): Payer: Self-pay | Admitting: Emergency Medicine

## 2019-09-16 ENCOUNTER — Emergency Department (HOSPITAL_BASED_OUTPATIENT_CLINIC_OR_DEPARTMENT_OTHER): Payer: Medicaid Other

## 2019-09-16 ENCOUNTER — Emergency Department (HOSPITAL_COMMUNITY)
Admission: EM | Admit: 2019-09-16 | Discharge: 2019-09-16 | Disposition: A | Discharge status: Home / Self Care | Payer: Medicaid Other | Attending: Emergency Medicine | Admitting: Emergency Medicine

## 2019-09-16 DIAGNOSIS — R609 Edema, unspecified: Secondary | ICD-10-CM | POA: Diagnosis not present

## 2019-09-16 DIAGNOSIS — Z79899 Other long term (current) drug therapy: Secondary | ICD-10-CM | POA: Insufficient documentation

## 2019-09-16 DIAGNOSIS — M7989 Other specified soft tissue disorders: Secondary | ICD-10-CM | POA: Diagnosis not present

## 2019-09-16 DIAGNOSIS — Z87891 Personal history of nicotine dependence: Secondary | ICD-10-CM | POA: Insufficient documentation

## 2019-09-16 DIAGNOSIS — M79609 Pain in unspecified limb: Secondary | ICD-10-CM | POA: Diagnosis not present

## 2019-09-16 DIAGNOSIS — R600 Localized edema: Secondary | ICD-10-CM

## 2019-09-16 DIAGNOSIS — M5441 Lumbago with sciatica, right side: Secondary | ICD-10-CM | POA: Diagnosis not present

## 2019-09-16 DIAGNOSIS — R2241 Localized swelling, mass and lump, right lower limb: Secondary | ICD-10-CM | POA: Diagnosis present

## 2019-09-16 MED ORDER — FUROSEMIDE 20 MG PO TABS
20.0000 mg | ORAL_TABLET | Freq: Once | ORAL | Status: AC
  Administered 2019-09-16: 20 mg via ORAL
  Filled 2019-09-16: qty 1

## 2019-09-16 MED ORDER — FUROSEMIDE 20 MG PO TABS: 20.0000 mg | ORAL_TABLET | Freq: Every day | ORAL | 0 refills | Status: DC

## 2019-09-16 NOTE — ED Notes (Signed)
Patient verbalizes understanding of discharge instructions. Opportunity for questioning and answers were provided. Armband removed by staff, pt discharged from ED by wheelchair with daughter to take her home

## 2019-09-16 NOTE — ED Notes (Signed)
Dr. Tamera Punt made aware of patient, verbal order for DVT given to this RN by Dr. Tamera Punt.

## 2019-09-16 NOTE — Progress Notes (Signed)
Right lower extremity venous duplex completed. Refer to "CV Proc" under chart review to view preliminary results.  09/16/2019 3:29 PM Kelby Aline., MHA, RVT, RDCS, RDMS

## 2019-09-16 NOTE — ED Notes (Signed)
Pt is upset requesting her [place] results. This NT explained to the pt that her results would be given to her by one of the providers once she was back in a room. Pt began to yell and use profanity to towards this NT and demanding her results. This NT once again explained to her that she would have to wait. Pt continues to yell and use profanity towards this NT.

## 2019-09-16 NOTE — ED Provider Notes (Signed)
[facility] EMERGENCY DEPARTMENT Provider Note   [name]: [id number] Arrival date & time: 09/16/19  1346     History Chief Complaint  Patient presents with  . Leg Swelling  . Leg Pain    [name] is a 44 y.o. female.  HPI She presents for evaluation of weight gain, 20 to 30 pounds, leg swelling, lower back pain, right leg pain, right knee pain.  She has been seeing a provider at an urgent care, who did labs several days ago which returned as normal except for mild elevation of transaminases.  She had screening tests, CBC, c-Met, ANA, thyroid, and hemoglobin A1c.  No known trauma.  She works as a Recruitment consultant.  She feels her symptoms were progressive since she had Covid infection in January 2021.  She denies current fever, nausea, vomiting, focal weakness or paresthesia.  There are no other known modifying factors.    Past Medical History:  Diagnosis Date  . Cervical dysplasia 1993   . Migraine   . Obesity     Patient Active Problem List   Diagnosis Date Noted  . Exercise-induced asthma 07/21/2018  . Exertional dyspnea 12/15/2017  . Elevated troponin   . Mild obstructive sleep apnea 02/14/2015  . Snoring 11/14/2014  . Inguinal adenopathy 10/31/2014  . Positive D dimer 10/17/2014  . Pedal edema 10/14/2014  . Fatigue 10/14/2014  . Morbid obesity ([place]) 08/07/2006  . TOBACCO DEPENDENCE 08/07/2006  . MIGRAINE, UNSPEC., W/O INTRACTABLE MIGRAINE 08/07/2006  . RHINITIS, ALLERGIC 08/07/2006    Past Surgical History:  Procedure Laterality Date  . CESAREAN SECTION     x 4  . COLPOSCOPY    . TUBAL LIGATION Bilateral 2006  . UMBILICAL HERNIA REPAIR  2007     OB History    Gravida  6   Para  4   Term  4   Preterm      AB  2   Living  4     SAB  1   TAB  1   Ectopic      Multiple      Live Births  4           Family History  Problem Relation Age of Onset  . Ovarian cancer Mother 18  . Heart failure Mother   . Depression Mother     . Cancer Mother        uterine cancer   . Heart failure Father 76       MI  . Diabetes Father        type 2  . Hypertension Father   . Heart disease Sister     Social History   Tobacco Use  . Smoking status: Former Smoker    Packs/day: 0.50    Quit date: 06/08/2018    Years since quitting: 1.2  . Smokeless tobacco: Never Used  Substance Use Topics  . Alcohol use: No    Alcohol/week: 0.0 standard drinks  . Drug use: No    Home Medications Prior to Admission medications   Medication Sig Start Date End Date Taking? Authorizing Provider  albuterol (VENTOLIN HFA) 108 (90 Base) MCG/ACT inhaler Inhale 1-2 puffs into the lungs every 6 (six) hours as needed for wheezing or shortness of breath. 03/31/19   Wieters, Hallie C, PA-C  azithromycin (ZITHROMAX) 250 MG tablet Take 1 tablet (250 mg total) by mouth daily. Take first 2 tablets together, then 1 every day until finished. 03/31/19   Wieters,  Hallie C, PA-C  benzonatate (TESSALON) 100 MG capsule Take 1 capsule (100 mg total) by mouth 3 (three) times daily as needed for cough. 06/15/19   Petrucelli, Samantha R, PA-C  fluticasone (FLONASE) 50 MCG/ACT nasal spray Place 1 spray into both nostrils daily. 06/15/19   Petrucelli, Samantha R, PA-C  furosemide (LASIX) 20 MG tablet Take 1 tablet (20 mg total) by mouth daily. 09/16/19   Daleen Bo, MD  HYDROcodone-homatropine [facility]) 5-1.5 MG/5ML syrup Take 5 mLs by mouth at bedtime. 03/31/19   Wieters, Hallie C, PA-C  naproxen (NAPROSYN) 500 MG tablet Take 1 tablet (500 mg total) by mouth 2 (two) times daily. 06/15/19   Petrucelli, Samantha R, PA-C  Vitamin D, Ergocalciferol, (DRISDOL) 1.25 MG (50000 UT) CAPS capsule Take by mouth. 07/02/18   [provider]  phentermine (ADIPEX-P) 37.5 MG tablet Take 1 tablet (37.5 mg total) by mouth daily before breakfast for 30 days. 08/18/18 03/31/19  Kerin Perna, NP    Allergies    Iodinated diagnostic agents and Morphine and related  Review  of Systems   Review of Systems  All other systems reviewed and are negative.   Physical Exam Updated Vital Signs BP (!) 137/56   Pulse 84   Temp 98.6 F (37 C) (Oral)   Resp 15   SpO2 98%   Physical Exam Vitals and nursing note reviewed.  Constitutional:      General: She is not in acute distress.    Appearance: She is well-developed. She is obese. She is not ill-appearing, toxic-appearing or diaphoretic.  HENT:     Head: Normocephalic and atraumatic.  Eyes:     Conjunctiva/sclera: Conjunctivae normal.     Pupils: Pupils are equal, round, and reactive to light.  Neck:     Trachea: Phonation normal.  Cardiovascular:     Rate and Rhythm: Normal rate and regular rhythm.  Pulmonary:     Effort: Pulmonary effort is normal.     Breath sounds: Normal breath sounds.  Chest:     Chest wall: No tenderness.  Abdominal:     General: There is no distension.     Palpations: Abdomen is soft.     Tenderness: There is no abdominal tenderness. There is no guarding.  Musculoskeletal:        General: Normal range of motion.     Cervical back: Normal range of motion and neck supple.     Comments: Mild swelling right knee with preserved normal range of motion.  Moderate bilateral lower leg and upper leg edema.  Edema is symmetric.  No low back tenderness or altered motion.  Skin:    General: Skin is warm and dry.     Coloration: Skin is not jaundiced or pale.     Comments: Nonspecific mottling of right anterior thigh.  No abnormal skin on lower legs or feet.  Neurological:     Mental Status: She is alert and oriented to person, place, and time.     Motor: No abnormal muscle tone.  Psychiatric:        Mood and Affect: Mood normal.        Behavior: Behavior normal.        Thought Content: Thought content normal.        Judgment: Judgment normal.     ED Results / Procedures / Treatments   Labs (all labs ordered are listed, but only abnormal results are displayed) Labs Reviewed - No  data to display  EKG None  Radiology VAS [place] LOWER EXTREMITY VENOUS (DVT) (ONLY [facility] & [facility])  Result Date: 09/16/2019  Lower Venous DVTStudy Indications: Pain, and Swelling.  Comparison Study: No prior study Performing Technologist: Maudry Mayhew MHA, RDMS, RVT, RDCS  Examination Guidelines: A complete evaluation includes B-mode imaging, spectral Doppler, color Doppler, and power Doppler as needed of all accessible portions of each vessel. Bilateral testing is considered an integral part of a complete examination. Limited examinations for reoccurring indications may be performed as noted. The reflux portion of the exam is performed with the patient in reverse Trendelenburg.  +---------+---------------+---------+-----------+----------+--------------+ RIGHT    CompressibilityPhasicitySpontaneityPropertiesThrombus Aging +---------+---------------+---------+-----------+----------+--------------+ CFV      Full           Yes      Yes                                 +---------+---------------+---------+-----------+----------+--------------+ SFJ      Full                                                        +---------+---------------+---------+-----------+----------+--------------+ FV Prox  Full                                                        +---------+---------------+---------+-----------+----------+--------------+ FV Mid   Full                                                        +---------+---------------+---------+-----------+----------+--------------+ FV DistalFull                                                        +---------+---------------+---------+-----------+----------+--------------+ PFV      Full                                                        +---------+---------------+---------+-----------+----------+--------------+ POP      Full           Yes      Yes                                  +---------+---------------+---------+-----------+----------+--------------+ PTV      Full                                                        +---------+---------------+---------+-----------+----------+--------------+ PERO     Full                                                        +---------+---------------+---------+-----------+----------+--------------+   +----+---------------+---------+-----------+----------+--------------+  LEFTCompressibilityPhasicitySpontaneityPropertiesThrombus Aging +----+---------------+---------+-----------+----------+--------------+ CFV Full           Yes      Yes                                 +----+---------------+---------+-----------+----------+--------------+     Summary: RIGHT: - There is no evidence of deep vein thrombosis in the lower extremity. However, portions of this examination were limited- see technologist comments above.  - No cystic structure found in the popliteal fossa.  LEFT: - No evidence of common femoral vein obstruction.  *See table(s) above for measurements and observations.    Preliminary     Procedures Procedures (including critical care time)  Medications Ordered in ED Medications  furosemide (LASIX) tablet 20 mg (has no administration in time range)    ED Course  I have reviewed the triage vital signs and the nursing notes.  Pertinent labs & imaging results that were available during my care of the patient were reviewed by me and considered in my medical decision making (see chart for details).    MDM Rules/Calculators/A&P                       Patient Vitals for the past 24 hrs:  BP Temp Temp src Pulse Resp SpO2  09/16/19 2000 (!) 137/56 -- -- 84 -- 98 %  09/16/19 1815 (!) 148/80 -- -- 90 15 100 %  09/16/19 1350 (!) 150/79 98.6 F (37 C) Oral (!) 101 20 99 %    8:34 PM Reevaluation with update and discussion. After initial assessment and treatment, an updated evaluation reveals she is  comfortable has no further complaints.  5 discussed questions answered. Daleen Bo   Medical Decision Making:  This patient is presenting for evaluation of bilateral lower leg swelling and low back pain.  This is associated with right leg pain., which does require a range of treatment options, and is a complaint that involves a mild to moderate risk of morbidity and mortality. The differential diagnoses include sciatica, lumbar radiculopathy, spinal myelopathy, degenerative joint disease, venous thromboembolism. I decided  to review old records, and in summary relatively healthy obese female who has significant weight gain recently.  She has signs and symptoms of lumbar radiculopathy, without significant concern for spinal myelopathy.  No traumatic injuries.  Doubt fracture or joint injuries. I ordered and Reviewed the leg ultrasound/Doppler to evaluate for venous thromboembolism' medicine Test.  Critical Interventions-initiation of diuretic treatment  After These Interventions, the Patient was reevaluated and was found stable for discharge with outpatient follow-up  CRITICAL CARE-no Performed by: Daleen Bo   Nursing Notes Reviewed/ Care Coordinated Applicable Imaging Reviewed Interpretation of Laboratory Data incorporated into ED treatment  The patient appears reasonably screened and/or stabilized for discharge and I doubt any other medical condition or other [facility] requiring further screening, evaluation, or treatment in the ED at this time prior to discharge.  Plan: Home Medications-continue usual medications; Home Treatments-low-salt diet monitor weight; return here if the recommended treatment, does not improve the symptoms; Recommended follow up-PCP checkup 1 week    Final Clinical Impression(s) / ED Diagnoses Final diagnoses:  Peripheral edema  Low back pain with right-sided sciatica, unspecified back pain laterality, unspecified chronicity    Rx / DC Orders ED Discharge  Orders         Ordered    furosemide (LASIX) 20 MG tablet  Daily  09/16/19 2039           Daleen Bo, MD 09/16/19 2041

## 2019-09-16 NOTE — Discharge Instructions (Signed)
It is not clear what is causing your swelling, but fluid is a high possibility.  We are prescribing a few days worth of a diuretic to improve your fluid output.  This can lower your potassium so make sure you eat foods which contain potassium while you are on the diuretic medication.  The back pain and right leg pain may be from sciatica.  To treat this, use heat on the sore areas and take Tylenol for pain.  Make sure you follow-up with your primary care doctor in a week or so for further testing and treatment.  Ask her to check your potassium level.

## 2019-09-16 NOTE — ED Triage Notes (Addendum)
Pt states bilateral leg swelling with R leg pain over the past week, [facility], states she went back today for follow up and was sent here for concerns about "mottling" to her R upper leg. She reports that the pain in her R leg is from her hip, down to her foot. Denies cp or sob at this time.

## 2019-09-21 ENCOUNTER — Other Ambulatory Visit: Payer: Self-pay

## 2019-09-21 ENCOUNTER — Encounter (INDEPENDENT_AMBULATORY_CARE_PROVIDER_SITE_OTHER): Payer: Self-pay | Admitting: Primary Care

## 2019-09-21 ENCOUNTER — Ambulatory Visit (INDEPENDENT_AMBULATORY_CARE_PROVIDER_SITE_OTHER): Payer: Medicaid Other | Admitting: Primary Care

## 2019-09-21 DIAGNOSIS — Z6841 Body Mass Index (BMI) 40.0 and over, adult: Secondary | ICD-10-CM

## 2019-09-21 DIAGNOSIS — R600 Localized edema: Secondary | ICD-10-CM

## 2019-09-21 DIAGNOSIS — F17200 Nicotine dependence, unspecified, uncomplicated: Secondary | ICD-10-CM

## 2019-09-21 DIAGNOSIS — G4733 Obstructive sleep apnea (adult) (pediatric): Secondary | ICD-10-CM | POA: Diagnosis not present

## 2019-09-21 DIAGNOSIS — Z09 Encounter for follow-up examination after completed treatment for conditions other than malignant neoplasm: Secondary | ICD-10-CM

## 2019-09-21 MED ORDER — CHLORTHALIDONE 50 MG PO TABS: 50.0000 mg | ORAL_TABLET | Freq: Every day | ORAL | 0 refills | Status: DC

## 2019-09-21 NOTE — Patient Instructions (Signed)

## 2019-09-21 NOTE — Progress Notes (Signed)
Established Patient Office Visit  Subjective:  Patient ID: [name], female    DOB: [birth year]  Age: 44 y.o. MRN: [id number]  CC:  Chief Complaint  Patient presents with  . Hospitalization Follow-up    peripheral edema     HPI [name] is a 44 year old African American female recently seen in the emergency room for peripheral edema and was treated with lasix. She has voiced concerns about fluid gain versus weight gain. Reviewed last weight encounter with [facility] a weight gain of 42 lbs. Shortness of breath with exertion, denies chest pain, palpitation , headaches.     Past Medical History:  Diagnosis Date  . Cervical dysplasia 1993   . Migraine   . Obesity     Past Surgical History:  Procedure Laterality Date  . CESAREAN SECTION     x 4  . COLPOSCOPY    . TUBAL LIGATION Bilateral 2006  . UMBILICAL HERNIA REPAIR  2007    Family History  Problem Relation Age of Onset  . Ovarian cancer Mother 55  . Heart failure Mother   . Depression Mother   . Cancer Mother        uterine cancer   . Heart failure Father 68       MI  . Diabetes Father        type 2  . Hypertension Father   . Heart disease Sister     Social History   Socioeconomic History  . Marital status: Single    Spouse name: Not on file  . Number of children: 4  . Years of education: Not on file  . Highest education level: Not on file  Occupational History    Employer: [employer]   Tobacco Use  . Smoking status: Former Smoker    Packs/day: 0.50    Quit date: 06/08/2018    Years since quitting: 1.2  . Smokeless tobacco: Never Used  Substance and Sexual Activity  . Alcohol use: No    Alcohol/week: 0.0 standard drinks  . Drug use: No  . Sexual activity: Yes  Other Topics Concern  . Not on file  Social History Narrative  . Not on file   Social Determinants of Health   Financial Resource Strain:   . Difficulty of Paying Living Expenses:   Food Insecurity:   . Worried About  Charity fundraiser in the Last Year:   . Arboriculturist in the Last Year:   Transportation Needs:   . Film/video editor (Medical):   Marland Kitchen Lack of Transportation (Non-Medical):   Physical Activity:   . Days of Exercise per Week:   . Minutes of Exercise per Session:   Stress:   . Feeling of Stress :   Social Connections:   . Frequency of Communication with Friends and Family:   . Frequency of Social Gatherings with Friends and Family:   . Attends Religious Services:   . Active Member of Clubs or Organizations:   . Attends Archivist Meetings:   Marland Kitchen Marital Status:   Intimate Partner Violence:   . Fear of Current or Ex-Partner:   . Emotionally Abused:   Marland Kitchen Physically Abused:   . Sexually Abused:     Outpatient Medications Prior to Visit  Medication Sig Dispense Refill  . furosemide (LASIX) 20 MG tablet Take 1 tablet (20 mg total) by mouth daily. 20 tablet 0  . albuterol (VENTOLIN HFA) 108 (90 Base) MCG/ACT inhaler  Inhale 1-2 puffs into the lungs every 6 (six) hours as needed for wheezing or shortness of breath. (Patient not taking: Reported on 09/21/2019) 8 g 0  . fluticasone (FLONASE) 50 MCG/ACT nasal spray Place 1 spray into both nostrils daily. (Patient not taking: Reported on 09/21/2019) 16 g 0  . azithromycin (ZITHROMAX) 250 MG tablet Take 1 tablet (250 mg total) by mouth daily. Take first 2 tablets together, then 1 every day until finished. 6 tablet 0  . benzonatate (TESSALON) 100 MG capsule Take 1 capsule (100 mg total) by mouth 3 (three) times daily as needed for cough. 21 capsule 0  . HYDROcodone-homatropine (HYCODAN) 5-1.5 MG/5ML syrup Take 5 mLs by mouth at bedtime. 70 mL 0  . naproxen (NAPROSYN) 500 MG tablet Take 1 tablet (500 mg total) by mouth 2 (two) times daily. 10 tablet 0  . Vitamin D, Ergocalciferol, (DRISDOL) 1.25 MG (50000 UT) CAPS capsule Take by mouth.     No facility-administered medications prior to visit.    Allergies  Allergen Reactions  .  Iodinated Diagnostic Agents Other (See Comments) and Nausea And Vomiting    Pt became hypotensive,lightheaded,near syncopal the last 2 times she had contrast( prior to 2007)////////NEW UPDATE, COUGHING, TROUBLE BREATHING AND HIVES PER PT//A.CALHOUN  . Morphine And Related Itching    ROS Review of Systems  Constitutional: Positive for fatigue.  Respiratory: Positive for shortness of breath.   All other systems reviewed and are negative.     Objective:    Physical Exam  Constitutional: She is oriented to person, place, and time. She appears well-developed and well-nourished.  Morbid   HENT:  Head: Normocephalic.  Cardiovascular: Normal rate and regular rhythm.  Pulmonary/Chest: Effort normal and breath sounds normal.  Abdominal: Soft. Bowel sounds are normal.  Musculoskeletal:        General: Normal range of motion.     Cervical back: Normal range of motion and neck supple.  Neurological: She is alert and oriented to person, place, and time. She has normal reflexes.  Skin: Skin is warm and dry.  Psychiatric: She has a normal mood and affect. Her behavior is normal. Judgment and thought content normal.    BP 110/74 (BP Location: Left Arm, Patient Position: Sitting, Cuff Size: Large)   Pulse 92   Temp (!) 97.3 F (36.3 C) (Temporal)   Ht 5\' 7"  (1.702 m)   Wt (!) 351 lb 3.2 oz (159.3 kg)   SpO2 96%   BMI 55.01 kg/m  Wt Readings from Last 3 Encounters:  09/21/19 (!) 351 lb 3.2 oz (159.3 kg)  01/12/19 (!) 309 lb (140.2 kg)  08/18/18 (!) 317 lb 9.6 oz (144.1 kg)     Health Maintenance Due  Topic Date Due  . PAP SMEAR-Modifier  06/12/2015    There are no preventive care reminders to display for this patient.  Lab Results  Component Value Date   TSH 1.960 06/24/2018   Lab Results  Component Value Date   WBC 7.2 06/24/2018   HGB 11.7 06/24/2018   HCT 35.9 06/24/2018   MCV 90 06/24/2018   PLT 275 06/24/2018   Lab Results  Component Value Date   NA 139  06/24/2018   K 4.2 06/24/2018   CO2 26 06/24/2018   GLUCOSE 99 06/24/2018   BUN 11 06/24/2018   CREATININE 0.87 06/24/2018   BILITOT 0.7 06/24/2018   ALKPHOS 75 06/24/2018   AST 36 06/24/2018   ALT 77 (H) 06/24/2018   PROT 6.3 06/24/2018  ALBUMIN 3.8 06/24/2018   CALCIUM 9.3 06/24/2018   ANIONGAP 7 12/15/2017   Lab Results  Component Value Date   CHOL 179 06/24/2018   Lab Results  Component Value Date   HDL 58 06/24/2018   Lab Results  Component Value Date   LDLCALC 92 06/24/2018   Lab Results  Component Value Date   TRIG 147 06/24/2018   Lab Results  Component Value Date   CHOLHDL 3.1 06/24/2018   Lab Results  Component Value Date   HGBA1C 5.0 01/09/2018      Assessment & Plan:  Vickie was seen today for hospitalization follow-up.  Diagnoses and all orders for this visit:  Morbid obesity ([place]) Morbid Obesity is greater than  indicating an excess in caloric intake or underlining conditions. This may lead to other co-morbidities such as  Lifestyle modifications of diet and exercise may reduce obesity.   OSA (obstructive sleep apnea) Uses cpap anytime she lays down  Patient presents with possible obstructive sleep apnea. Patent has a  year history of symptoms of OSA .   Tobacco use disorder   Increased risk for lung cancer and other respiratory diseases recommend cessation.  This will be reminded at each clinical visit.  Pedal edema Recommend compression hose and added  Chlorthalidone 50 mg by mouth daily.  Other orders -     chlorthalidone (HYGROTON) 50 MG tablet; Take 1 tablet (50 mg total) by mouth daily.    Follow-up: Return in about 2 years (around 09/20/2021) for in nperson weight, labs Bp.    Kerin Perna, NP

## 2019-09-27 ENCOUNTER — Telehealth (INDEPENDENT_AMBULATORY_CARE_PROVIDER_SITE_OTHER): Payer: Self-pay

## 2019-09-27 NOTE — Telephone Encounter (Signed)
Spoke with patient she will keep current appointment next week to determine return to work date. Patient in agreement

## 2019-09-27 NOTE — Telephone Encounter (Signed)
Sent to PCP ?

## 2019-09-27 NOTE — Telephone Encounter (Signed)
Patient would like to know if PCP can clear her to go back to work until her appointment on the 28 of April. If so patient would like for letter to be uploaded on to mychart.  Please advice [phone]

## 2019-10-06 ENCOUNTER — Ambulatory Visit (INDEPENDENT_AMBULATORY_CARE_PROVIDER_SITE_OTHER): Payer: Medicaid Other | Admitting: Primary Care

## 2019-10-06 ENCOUNTER — Other Ambulatory Visit: Payer: Self-pay

## 2019-10-06 ENCOUNTER — Encounter (INDEPENDENT_AMBULATORY_CARE_PROVIDER_SITE_OTHER): Payer: Self-pay | Admitting: Primary Care

## 2019-10-06 VITALS — BP 110/76 | HR 85 | Temp 97.7°F | Resp 99 | Ht 67.0 in | Wt 358.4 lb

## 2019-10-06 DIAGNOSIS — F17200 Nicotine dependence, unspecified, uncomplicated: Secondary | ICD-10-CM

## 2019-10-06 DIAGNOSIS — R600 Localized edema: Secondary | ICD-10-CM | POA: Diagnosis not present

## 2019-10-06 MED ORDER — PHENTERMINE HCL 37.5 MG PO TABS: 37.5000 mg | ORAL_TABLET | Freq: Every day | ORAL | 1 refills | Status: DC

## 2019-10-06 MED ORDER — FUROSEMIDE 20 MG PO TABS: 20.0000 mg | ORAL_TABLET | Freq: Every day | ORAL | 1 refills | Status: DC

## 2019-10-06 NOTE — Patient Instructions (Signed)
Phentermine tablets or capsules What is this medicine? PHENTERMINE (FEN ter meen) decreases your appetite. It is used with a reduced calorie diet and exercise to help you lose weight. This medicine may be used for other purposes; ask your health care provider or pharmacist if you have questions. COMMON BRAND NAME(S): Adipex-P, Atti-Plex P, Atti-Plex P Spansule, Fastin, Lomaira, Pro-Fast, Tara-8 What should I tell my health care provider before I take this medicine? They need to know if you have any of these conditions:  agitation or nervousness  diabetes  glaucoma  heart disease  high blood pressure  history of drug abuse or addiction  history of stroke  kidney disease  lung disease called Primary Pulmonary Hypertension (PPH)  taken an MAOI like Carbex, Eldepryl, Marplan, Nardil, or Parnate in last 14 days  taking stimulant medicines for attention disorders, weight loss, or to stay awake  thyroid disease  an unusual or allergic reaction to phentermine, other medicines, foods, dyes, or preservatives  pregnant or trying to get pregnant  breast-feeding How should I use this medicine? Take this medicine by mouth with a glass of water. Follow the directions on the prescription label. Take your medicine at regular intervals. Do not take it more often than directed. Do not stop taking except on your doctor's advice. Talk to your pediatrician regarding the use of this medicine in children. While this drug may be prescribed for children 17 years or older for selected conditions, precautions do apply. Overdosage: If you think you have taken too much of this medicine contact a poison control center or emergency room at once. NOTE: This medicine is only for you. Do not share this medicine with others. What if I miss a dose? If you miss a dose, take it as soon as you can. If it is almost time for your next dose, take only that dose. Do not take double or extra doses. What may interact  with this medicine? Do not take this medicine with any of the following medications:  MAOIs like Carbex, Eldepryl, Marplan, Nardil, and Parnate This medicine may also interact with the following medications:  alcohol  certain medicines for depression, anxiety, or psychotic disorders  certain medicines for high blood pressure  linezolid  medicines for colds or breathing difficulties like pseudoephedrine or phenylephrine  medicines for diabetes  sibutramine  stimulant medicines for attention disorders, weight loss, or to stay awake This list may not describe all possible interactions. Give your health care provider a list of all the medicines, herbs, non-prescription drugs, or dietary supplements you use. Also tell them if you smoke, drink alcohol, or use illegal drugs. Some items may interact with your medicine. What should I watch for while using this medicine? Visit your doctor or health care provider for regular checks on your progress. Do not stop taking except on your health care provider's advice. You may develop a severe reaction. Your health care provider will tell you how much medicine to take. Do not take this medicine close to bedtime. It may prevent you from sleeping. You may get drowsy or dizzy. Do not drive, use machinery, or do anything that needs mental alertness until you know how this medicine affects you. Do not stand or sit up quickly, especially if you are an older patient. This reduces the risk of dizzy or fainting spells. Alcohol may increase dizziness and drowsiness. Avoid alcoholic drinks. This medicine may affect blood sugar levels. Ask your healthcare provider if changes in diet or medicines are needed  if you have diabetes. Women should inform their health care provider if they wish to become pregnant or think they might be pregnant. Losing weight while pregnant is not advised and may cause harm to the unborn child. Talk to your health care provider for more  information. What side effects may I notice from receiving this medicine? Side effects that you should report to your doctor or health care professional as soon as possible:  allergic reactions like skin rash, itching or hives, swelling of the face, lips, or tongue  breathing problems  changes in emotions or moods  changes in vision  chest pain or chest tightness  fast, irregular heartbeat  feeling faint or lightheaded  increased blood pressure  irritable  restlessness  tremors  seizures  signs and symptoms of a stroke like changes in vision; confusion; trouble speaking or understanding; severe headaches; sudden numbness or weakness of the face, arm or leg; trouble walking; dizziness; loss of balance or coordination  unusually weak or tired Side effects that usually do not require medical attention (report to your doctor or health care professional if they continue or are bothersome):  changes in taste  constipation or diarrhea  dizziness  dry mouth  headache  trouble sleeping  upset stomach This list may not describe all possible side effects. Call your doctor for medical advice about side effects. You may report side effects to FDA at 1-800-FDA-1088. Where should I keep my medicine? Keep out of the reach of children. This medicine can be abused. Keep your medicine in a safe place to protect it from theft. Do not share this medicine with anyone. Selling or giving away this medicine is dangerous and against the law. This medicine may cause harm and death if it is taken by other adults, children, or pets. Return medicine that has not been used to an official disposal site. Contact the DEA at 1-800-882-9539 or your city/county government to find a site. If you cannot return the medicine, mix any unused medicine with a substance like cat litter or coffee grounds. Then throw the medicine away in a sealed container like a sealed bag or coffee can with a lid. Do not use the  medicine after the expiration date. Store at room temperature between 20 and 25 degrees C (68 and 77 degrees F). Keep container tightly closed. NOTE: This sheet is a summary. It may not cover all possible information. If you have questions about this medicine, talk to your doctor, pharmacist, or health care provider.  2020 Elsevier/Gold Standard (2019-04-02 12:54:20)  

## 2019-10-06 NOTE — Progress Notes (Signed)
Established Patient Office Visit  Subjective:  Patient ID: [name], female    DOB: December 19, 1975  Age: 44 y.o. MRN: [id number]  CC:  Chief Complaint  Patient presents with  . Blood Pressure Check  . Weight Check    HPI [name] presents for feeling dizzy and not feeling well from change in diuretic medication chlorthalidone 50 mg was taking lasix 20 mg. She also stated she was using the bathroom less instead of more.Denies shortness of breath, headaches, chest pain or lower extremity edema, sudden onset, vision changes, unilateral weakness,  paresthesias  Past Medical History:  Diagnosis Date  . Cervical dysplasia 1993   . Migraine   . Obesity     Past Surgical History:  Procedure Laterality Date  . CESAREAN SECTION     x 4  . COLPOSCOPY    . TUBAL LIGATION Bilateral 2006  . UMBILICAL HERNIA REPAIR  2007    Family History  Problem Relation Age of Onset  . Ovarian cancer Mother 42  . Heart failure Mother   . Depression Mother   . Cancer Mother        uterine cancer   . Heart failure Father 64       MI  . Diabetes Father        type 2  . Hypertension Father   . Heart disease Sister     Social History   Socioeconomic History  . Marital status: Single    Spouse name: Not on file  . Number of children: 4  . Years of education: Not on file  . Highest education level: Not on file  Occupational History    Employer: [name]   Tobacco Use  . Smoking status: Former Smoker    Packs/day: 0.50    Quit date: 06/08/2018    Years since quitting: 1.3  . Smokeless tobacco: Never Used  Substance and Sexual Activity  . Alcohol use: No    Alcohol/week: 0.0 standard drinks  . Drug use: No  . Sexual activity: Yes  Other Topics Concern  . Not on file  Social History Narrative  . Not on file   Social Determinants of Health   Financial Resource Strain:   . Difficulty of Paying Living Expenses:   Food Insecurity:   . Worried About Charity fundraiser in  the Last Year:   . Arboriculturist in the Last Year:   Transportation Needs:   . Film/video editor (Medical):   Marland Kitchen Lack of Transportation (Non-Medical):   Physical Activity:   . Days of Exercise per Week:   . Minutes of Exercise per Session:   Stress:   . Feeling of Stress :   Social Connections:   . Frequency of Communication with Friends and Family:   . Frequency of Social Gatherings with Friends and Family:   . Attends Religious Services:   . Active Member of Clubs or Organizations:   . Attends Archivist Meetings:   Marland Kitchen Marital Status:   Intimate Partner Violence:   . Fear of Current or Ex-Partner:   . Emotionally Abused:   Marland Kitchen Physically Abused:   . Sexually Abused:     Outpatient Medications Prior to Visit  Medication Sig Dispense Refill  . chlorthalidone (HYGROTON) 50 MG tablet Take 1 tablet (50 mg total) by mouth daily. 90 tablet 0  . albuterol (VENTOLIN HFA) 108 (90 Base) MCG/ACT inhaler Inhale 1-2 puffs into the lungs every 6 (six) hours  as needed for wheezing or shortness of breath. (Patient not taking: Reported on 09/21/2019) 8 g 0  . fluticasone (FLONASE) 50 MCG/ACT nasal spray Place 1 spray into both nostrils daily. (Patient not taking: Reported on 09/21/2019) 16 g 0  . furosemide (LASIX) 20 MG tablet Take 1 tablet (20 mg total) by mouth daily. (Patient not taking: Reported on 10/06/2019) 20 tablet 0   No facility-administered medications prior to visit.    Allergies  Allergen Reactions  . Iodinated Diagnostic Agents Other (See Comments) and Nausea And Vomiting    Pt became hypotensive,lightheaded,near syncopal the last 2 times she had contrast( prior to 2007)////////NEW UPDATE, COUGHING, TROUBLE BREATHING AND HIVES PER PT//A.CALHOUN  . Morphine And Related Itching    ROS Review of Systems  Neurological: Positive for dizziness and weakness.  All other systems reviewed and are negative.     Objective:    Physical Exam  Constitutional: She is  oriented to person, place, and time. She appears well-developed and well-nourished.  HENT:  Head: Normocephalic.  Neck:  thick  Cardiovascular: Normal rate and regular rhythm.  Pulmonary/Chest: Effort normal and breath sounds normal.  Abdominal: Soft. Bowel sounds are normal.  Musculoskeletal:     Cervical back: Normal range of motion and neck supple.  Neurological: She is alert and oriented to person, place, and time.  Skin: Skin is warm and dry.  Psychiatric: She has a normal mood and affect. Her behavior is normal. Judgment and thought content normal.    BP 110/76 (BP Location: Left Arm, Patient Position: Sitting, Cuff Size: Large)   Pulse 85   Temp 97.7 F (36.5 C) (Temporal)   Resp (!) 99   Ht 5' 7"  (1.702 m)   Wt (!) 358 lb 6.4 oz (162.6 kg)   BMI 56.13 kg/m  Wt Readings from Last 3 Encounters:  10/06/19 (!) 358 lb 6.4 oz (162.6 kg)  09/21/19 (!) 351 lb 3.2 oz (159.3 kg)  01/12/19 (!) 309 lb (140.2 kg)     Health Maintenance Due  Topic Date Due  . COVID-19 Vaccine (1) Never done  . PAP SMEAR-Modifier  06/12/2015    There are no preventive care reminders to display for this patient.  Lab Results  Component Value Date   TSH 1.960 06/24/2018   Lab Results  Component Value Date   WBC 7.2 06/24/2018   HGB 11.7 06/24/2018   HCT 35.9 06/24/2018   MCV 90 06/24/2018   PLT 275 06/24/2018   Lab Results  Component Value Date   NA 141 10/06/2019   K 4.0 10/06/2019   CO2 24 10/06/2019   GLUCOSE 110 (H) 10/06/2019   BUN 13 10/06/2019   CREATININE 0.95 10/06/2019   BILITOT 0.6 10/06/2019   ALKPHOS 83 10/06/2019   AST 24 10/06/2019   ALT 35 (H) 10/06/2019   PROT 6.2 10/06/2019   ALBUMIN 4.1 10/06/2019   CALCIUM 9.6 10/06/2019   ANIONGAP 7 12/15/2017   Lab Results  Component Value Date   CHOL 179 06/24/2018   Lab Results  Component Value Date   HDL 58 06/24/2018   Lab Results  Component Value Date   LDLCALC 92 06/24/2018   Lab Results  Component  Value Date   TRIG 147 06/24/2018   Lab Results  Component Value Date   CHOLHDL 3.1 06/24/2018   Lab Results  Component Value Date   HGBA1C 5.0 01/09/2018      Assessment & Plan:  [name] was seen today for blood pressure check  and weight check.  Diagnoses and all orders for this visit:  Pedal edema Change diuretic from chlorthalidone 50 to lasix 20 mg daily. Daily weight call office if weight gain is 5 lbs. Monitor closely   -     CMP14+EGFR  Morbid obesity ([facility]) BMI > 40 is a indication for appetite suppressant, this includes diet modification and excercise -     phentermine (ADIPEX-P) 37.5 MG tablet; Take 1 tablet (37.5 mg total) by mouth daily before breakfast.  Tobacco use disorder She is aware of increased risk for lung cancer and other respiratory diseases recommend cessation.  This will be reminded at each clinical visit.    Other orders -     furosemide (LASIX) 20 MG tablet; Take 1 tablet (20 mg total) by mouth daily.    Meds ordered this encounter  Medications  . phentermine (ADIPEX-P) 37.5 MG tablet    Sig: Take 1 tablet (37.5 mg total) by mouth daily before breakfast.    Dispense:  30 tablet    Refill:  1  . furosemide (LASIX) 20 MG tablet    Sig: Take 1 tablet (20 mg total) by mouth daily.    Dispense:  90 tablet    Refill:  1    Follow-up: Return in about 5 weeks (around 11/10/2019) for tele weight check.    Kerin Perna, NP

## 2019-10-06 NOTE — Progress Notes (Signed)
Pt states chlorthalidone is not working and it causes her to feel dizzy

## 2019-10-07 LAB — CMP14+EGFR
ALT: 35 IU/L — ABNORMAL HIGH (ref 0–32)
AST: 24 IU/L (ref 0–40)
Albumin/Globulin Ratio: 2 (ref 1.2–2.2)
Albumin: 4.1 g/dL (ref 3.8–4.8)
Alkaline Phosphatase: 83 IU/L (ref 39–117)
BUN/Creatinine Ratio: 14 (ref 9–23)
BUN: 13 mg/dL (ref 6–24)
Bilirubin Total: 0.6 mg/dL (ref 0.0–1.2)
CO2: 24 mmol/L (ref 20–29)
Calcium: 9.6 mg/dL (ref 8.7–10.2)
Chloride: 101 mmol/L (ref 96–106)
Creatinine, Ser: 0.95 mg/dL (ref 0.57–1.00)
GFR calc Af Amer: 84 mL/min/{1.73_m2} (ref >59)
GFR calc non Af Amer: 73 mL/min/{1.73_m2} (ref >59)
Globulin, Total: 2.1 g/dL (ref 1.5–4.5)
Glucose: 110 mg/dL — ABNORMAL HIGH (ref 65–99)
Potassium: 4 mmol/L (ref 3.5–5.2)
Sodium: 141 mmol/L (ref 134–144)
Total Protein: 6.2 g/dL (ref 6.0–8.5)

## 2019-11-12 ENCOUNTER — Other Ambulatory Visit: Payer: Self-pay

## 2019-11-12 ENCOUNTER — Telehealth (INDEPENDENT_AMBULATORY_CARE_PROVIDER_SITE_OTHER): Payer: Medicaid Other | Admitting: Primary Care

## 2019-11-24 ENCOUNTER — Other Ambulatory Visit: Payer: Self-pay

## 2019-11-24 ENCOUNTER — Ambulatory Visit (HOSPITAL_COMMUNITY)
Admission: RE | Admit: 2019-11-24 | Discharge: 2019-11-24 | Disposition: A | Discharge status: Home / Self Care | Payer: Medicaid Other | Attending: Primary Care | Admitting: Primary Care

## 2019-11-24 ENCOUNTER — Telehealth (INDEPENDENT_AMBULATORY_CARE_PROVIDER_SITE_OTHER): Payer: Medicaid Other | Admitting: Primary Care

## 2019-11-24 ENCOUNTER — Ambulatory Visit: Admission: RE | Admit: 2019-11-24 | Payer: Medicaid Other | Source: Home / Self Care

## 2019-11-24 ENCOUNTER — Emergency Department (HOSPITAL_COMMUNITY): Admission: EM | Admit: 2019-11-24 | Payer: Medicaid Other | Source: Home / Self Care

## 2019-11-24 ENCOUNTER — Ambulatory Visit (HOSPITAL_COMMUNITY)
Admission: RE | Admit: 2019-11-24 | Discharge: 2019-11-24 | Disposition: A | Discharge status: Home / Self Care | Payer: Medicaid Other | Source: Ambulatory Visit | Attending: Primary Care | Admitting: Primary Care

## 2019-11-24 DIAGNOSIS — R05 Cough: Secondary | ICD-10-CM

## 2019-11-24 DIAGNOSIS — R058 Other specified cough: Secondary | ICD-10-CM

## 2019-11-24 NOTE — ED Notes (Signed)
Pt stated she didn't want any vital signs taken, and didn't want to be triaged. Pt stated "my dr sent me to go straight to get a chest xray done  And I dont want my insurance to charge me for an emergency room visit". She refused vital signs and wanted to talk to the nurse.

## 2019-11-24 NOTE — Progress Notes (Addendum)
Virtual Visit via Telephone Note  I connected with [name] on 11/24/19 at  2:10 PM EDT by telephone and verified that I am speaking with the correct person using two identifiers.   I discussed the limitations, risks, security and privacy concerns of performing an evaluation and management service by telephone and the availability of in person appointments. I also discussed with the patient that there may be a patient responsible charge related to this service. The patient expressed understanding and agreed to proceed. Patient was at home Pamela Mire, NP was at [facility]    History of Present Illness: [name] is a 44 year old female that has not felt well since November 11, 2019 febrile 100.4 for 3 days, productive cough body ache, shortness of breath  and pain.   Past Medical History:  Diagnosis Date  . Cervical dysplasia 1993   . Migraine   . Obesity    Current Outpatient Medications on File Prior to Visit  Medication Sig Dispense Refill  . albuterol (VENTOLIN HFA) 108 (90 Base) MCG/ACT inhaler Inhale 1-2 puffs into the lungs every 6 (six) hours as needed for wheezing or shortness of breath. (Patient not taking: Reported on 09/21/2019) 8 g 0  . fluticasone (FLONASE) 50 MCG/ACT nasal spray Place 1 spray into both nostrils daily. (Patient not taking: Reported on 09/21/2019) 16 g 0  . furosemide (LASIX) 20 MG tablet Take 1 tablet (20 mg total) by mouth daily. 90 tablet 1  . phentermine (ADIPEX-P) 37.5 MG tablet Take 1 tablet (37.5 mg total) by mouth daily before breakfast. 30 tablet 1   No current facility-administered medications on file prior to visit.   Observations/Objective: Review of Systems  Constitutional: Positive for chills and fever.  HENT: Positive for congestion.   Respiratory: Positive for sputum production and shortness of breath.   Musculoskeletal: Positive for myalgias.  All other systems reviewed and are negative.   Assessment and  Plan: Diagnoses and all orders for this visit:  Cough productive of clear sputum -     DG Chest 2 View; Future  Follow Up Instructions:    I discussed the assessment and treatment plan with the patient. The patient was provided an opportunity to ask questions and all were answered. The patient agreed with the plan and demonstrated an understanding of the instructions.   The patient was advised to call back or seek an in-person evaluation if the symptoms worsen or if the condition fails to improve as anticipated.  I provided 10 minutes of non-face-to-face time during this encounter.   Kerin Perna, NP

## 2019-11-26 ENCOUNTER — Telehealth (INDEPENDENT_AMBULATORY_CARE_PROVIDER_SITE_OTHER): Payer: Self-pay

## 2019-11-26 NOTE — Telephone Encounter (Signed)
Patient called requesting medication for her cough stating it has not improved. Patient was just seen on 11-24-19.  Please advice [phone]  Patient uses [facility] [place]

## 2019-11-27 ENCOUNTER — Other Ambulatory Visit (INDEPENDENT_AMBULATORY_CARE_PROVIDER_SITE_OTHER): Payer: Self-pay | Admitting: Primary Care

## 2019-11-27 MED ORDER — HYDROCOD POLST-CPM POLST ER 10-8 MG/5ML PO SUER: 5.0000 mL | Freq: Two times a day (BID) | ORAL | 0 refills | Status: DC | PRN

## 2019-11-27 NOTE — Telephone Encounter (Signed)
Sent in cough medication

## 2019-11-29 NOTE — Telephone Encounter (Signed)
Patient is aware 

## 2019-12-03 ENCOUNTER — Telehealth (INDEPENDENT_AMBULATORY_CARE_PROVIDER_SITE_OTHER): Payer: Self-pay

## 2019-12-03 ENCOUNTER — Other Ambulatory Visit (INDEPENDENT_AMBULATORY_CARE_PROVIDER_SITE_OTHER): Payer: Self-pay | Admitting: Primary Care

## 2019-12-03 NOTE — Telephone Encounter (Signed)
Patient called to request a work not when she was seen on 11-12-19. Patient states the nurse contacted her via phone call trying to beginning her appointment but nurse notice her cough was extremely bad and suggested to either continue with appointment or reschedule. Patient agreed to reschedule, patient had an appointment on 11-24-19. Patient is requesting note for work since she has not been able to work with her cough.   Please advice [phone]

## 2019-12-07 ENCOUNTER — Telehealth (INDEPENDENT_AMBULATORY_CARE_PROVIDER_SITE_OTHER): Payer: Medicaid Other | Admitting: Primary Care

## 2019-12-07 ENCOUNTER — Other Ambulatory Visit: Payer: Self-pay

## 2019-12-07 DIAGNOSIS — R05 Cough: Secondary | ICD-10-CM

## 2019-12-07 DIAGNOSIS — R058 Other specified cough: Secondary | ICD-10-CM

## 2019-12-07 MED ORDER — AZITHROMYCIN 250 MG PO TABS: ORAL_TABLET | ORAL | 0 refills | Status: DC

## 2019-12-07 MED ORDER — LEVOCETIRIZINE DIHYDROCHLORIDE 5 MG PO TABS: 5.0000 mg | ORAL_TABLET | Freq: Every evening | ORAL | 0 refills | Status: DC

## 2019-12-07 MED ORDER — FLUTICASONE PROPIONATE 50 MCG/ACT NA SUSP: 1.0000 | Freq: Every day | NASAL | 0 refills | Status: DC

## 2019-12-07 MED ORDER — FLUCONAZOLE 150 MG PO TABS: 150.0000 mg | ORAL_TABLET | Freq: Once | ORAL | 0 refills | Status: AC

## 2019-12-07 NOTE — Progress Notes (Addendum)
Virtual Visit via Telephone Note  I connected with [name] on 12/07/19 at  8:50 AM EDT by telephone and verified that I am speaking with the correct person using two identifiers.   I discussed the limitations, risks, security and privacy concerns of performing an evaluation and management service by telephone and the availability of in person appointments. I also discussed with the patient that there may be a patient responsible charge related to this service. The patient expressed understanding and agreed to proceed. Patient location unknown Pamela Mire, NP was at [facility]   History of Present Illness: [name] is a 44 year old female having a tele visit with the same symptoms and problems. Last visit order chest x-ray The heart size and mediastinal contours are within normal limits. Both lungs are clear. The visualized skeletal structures are Unremarkable. She admits to her grand child having RSV   Past Medical History:  Diagnosis Date  . Cervical dysplasia 1993   . Migraine   . Obesity    Current Outpatient Medications on File Prior to Visit  Medication Sig Dispense Refill  . chlorpheniramine-HYDROcodone (TUSSIONEX PENNKINETIC ER) 10-8 MG/5ML SUER Take 5 mLs by mouth every 12 (twelve) hours as needed for cough. 60 mL 0  . furosemide (LASIX) 20 MG tablet Take 1 tablet (20 mg total) by mouth daily. 90 tablet 1  . phentermine (ADIPEX-P) 37.5 MG tablet Take 1 tablet (37.5 mg total) by mouth daily before breakfast. 30 tablet 1   No current facility-administered medications on file prior to visit.   Observations/Objective: Review of Systems  Constitutional: Positive for chills, fever and malaise/fatigue.  HENT: Positive for congestion and sore throat.   Respiratory: Positive for cough, sputum production and shortness of breath.   All other systems reviewed and are negative.   Assessment and Plan: Diagnoses and all orders for this  visit:  Cough productive of clear sputum More than likely viral and maybe RSV but on examination patient had maxillary tenderness and swollen cervical chains on the left side treat sinus/bronchitis/ with antibiotics if no improvement go to urgent care Other orders -     Discontinue: fluticasone (FLONASE) 50 MCG/ACT nasal spray; Place 1 spray into both nostrils daily. -     fluconazole (DIFLUCAN) 150 MG tablet; Take 1 tablet (150 mg total) by mouth once for 1 dose. -     azithromycin (ZITHROMAX Z-PAK) 250 MG tablet; Take 2 tablets day 1 than 1 tablet every day until gone -     levocetirizine (XYZAL) 5 MG tablet; Take 1 tablet (5 mg total) by mouth every evening. -     fluticasone (FLONASE) 50 MCG/ACT nasal spray; Place 1 spray into both nostrils daily.    Follow Up Instructions:    I discussed the assessment and treatment plan with the patient. The patient was provided an opportunity to ask questions and all were answered. The patient agreed with the plan and demonstrated an understanding of the instructions.   The patient was advised to call back or seek an in-person evaluation if the symptoms worsen or if the condition fails to improve as anticipated.  I provided 73minutes of non-face-to-face time during this encounter.   Kerin Perna, NP

## 2019-12-07 NOTE — Progress Notes (Signed)
Cough  Since June 4. Now productive Worse at night  fever x 3 days Head congestion More fatigue  Daughter dx with RSV in May and was tested for COVID then  Taken tussionex and mucinex

## 2020-01-11 ENCOUNTER — Other Ambulatory Visit: Payer: Self-pay

## 2020-01-11 ENCOUNTER — Ambulatory Visit
Admission: EM | Admit: 2020-01-11 | Discharge: 2020-01-11 | Disposition: A | Discharge status: Home / Self Care | Payer: Medicaid Other | Attending: Physician Assistant | Admitting: Physician Assistant

## 2020-01-11 DIAGNOSIS — J209 Acute bronchitis, unspecified: Secondary | ICD-10-CM | POA: Diagnosis not present

## 2020-01-11 MED ORDER — ALBUTEROL SULFATE HFA 108 (90 BASE) MCG/ACT IN AERS: 1.0000 | INHALATION_SPRAY | Freq: Four times a day (QID) | RESPIRATORY_TRACT | 0 refills | Status: DC | PRN

## 2020-01-11 MED ORDER — PREDNISONE 50 MG PO TABS: 50.0000 mg | ORAL_TABLET | Freq: Every day | ORAL | 0 refills | Status: DC

## 2020-01-11 MED ORDER — AZELASTINE HCL 0.1 % NA SOLN: 2.0000 | Freq: Two times a day (BID) | NASAL | 0 refills | Status: DC

## 2020-01-11 NOTE — ED Triage Notes (Signed)
Pt states has been tx'd for cough and chest congestion since June and was taking out of work. States unable to get in to see her PCP. States trying to go back to work so she needs to be evaluated for her cough, wheezing and a covid test.

## 2020-01-11 NOTE — Discharge Instructions (Signed)
COVID testing ordered. Prednisone as directed. Continue flonase and xyzal, add azelastine nasal spray for congestion/possible allergies. Albuterol as needed for cough/shortness of breath. Follow up with PCP if symptoms does not improve for further workup. If having sudden worsening of symptoms, chest pain, trouble breathing, go to the ED for further evaluation.

## 2020-01-11 NOTE — ED Provider Notes (Signed)
[name]    [name]: [id number] Arrival date & time: 01/11/20  1107      History   Chief Complaint Chief Complaint  Patient presents with  . chest congestion    HPI [name] is a 44 y.o. female.   44 year old female with history of exercise induced asthma, OSA on CPAP comes in for continued URI symptoms x 2 months. Has had 2 televisit with PCP, negative CXR and was given course of azithromycin, flonase, xyzal. States finished medication with improvement of symptoms. Still having residual cough, though significantly improved from prior.  Cough occasionally can be productive.  Notices cough worse at night, and when moving/getting hot or flustered.  Denies shortness of breath, wheezing.  Had fever when symptoms first started, this has resolved for more than a few weeks.  She restarted smoking 2 weeks ago, 0.5 pack/day. Also with exposure to mold, RSV. No COVID testing since symptom started.      Past Medical History:  Diagnosis Date  . Cervical dysplasia 1993   . Migraine   . Obesity     Patient Active Problem List   Diagnosis Date Noted  . Exercise-induced asthma 07/21/2018  . Exertional dyspnea 12/15/2017  . Elevated troponin   . Mild obstructive sleep apnea 02/14/2015  . Snoring 11/14/2014  . Inguinal adenopathy 10/31/2014  . Positive D dimer 10/17/2014  . Pedal edema 10/14/2014  . Fatigue 10/14/2014  . Morbid obesity ([place]) 08/07/2006  . TOBACCO DEPENDENCE 08/07/2006  . MIGRAINE, UNSPEC., W/O INTRACTABLE MIGRAINE 08/07/2006  . RHINITIS, ALLERGIC 08/07/2006    Past Surgical History:  Procedure Laterality Date  . CESAREAN SECTION     x 4  . COLPOSCOPY    . TUBAL LIGATION Bilateral 2006  . UMBILICAL HERNIA REPAIR  2007    OB History    Gravida  6   Para  4   Term  4   Preterm      AB  2   Living  4     SAB  1   TAB  1   Ectopic      Multiple      Live Births  4            Home Medications    Prior to Admission  medications   Medication Sig Start Date End Date Taking? Authorizing Provider  albuterol (VENTOLIN HFA) 108 (90 Base) MCG/ACT inhaler Inhale 1-2 puffs into the lungs every 6 (six) hours as needed for wheezing or shortness of breath. 01/11/20   Tasia Catchings, Thanos Cousineau V, PA-C  azelastine (ASTELIN) 0.1 % nasal spray Place 2 sprays into both nostrils 2 (two) times daily. 01/11/20   Tasia Catchings, Demarie Uhlig V, PA-C  fluticasone (FLONASE) 50 MCG/ACT nasal spray Place 1 spray into both nostrils daily. 12/07/19   Kerin Perna, NP  furosemide (LASIX) 20 MG tablet Take 1 tablet (20 mg total) by mouth daily. 10/06/19   Kerin Perna, NP  levocetirizine (XYZAL) 5 MG tablet Take 1 tablet (5 mg total) by mouth every evening. 12/07/19   Kerin Perna, NP  phentermine (ADIPEX-P) 37.5 MG tablet Take 1 tablet (37.5 mg total) by mouth daily before breakfast. 10/06/19 11/05/19  Kerin Perna, NP  predniSONE (DELTASONE) 50 MG tablet Take 1 tablet (50 mg total) by mouth daily with breakfast. 01/11/20   Ok Edwards, PA-C    Family History Family History  Problem Relation Age of Onset  . Ovarian cancer Mother 32  .  Heart failure Mother   . Depression Mother   . Cancer Mother        uterine cancer   . Heart failure Father 78       MI  . Diabetes Father        type 2  . Hypertension Father   . Heart disease Sister     Social History Social History   Tobacco Use  . Smoking status: Former Smoker    Packs/day: 0.50    Quit date: 06/08/2018    Years since quitting: 1.5  . Smokeless tobacco: Never Used  Substance Use Topics  . Alcohol use: No    Alcohol/week: 0.0 standard drinks  . Drug use: No     Allergies   Iodinated diagnostic agents and Morphine and related   Review of Systems Review of Systems  Reason unable to perform ROS: See HPI as above.     Physical Exam Triage Vital Signs ED Triage Vitals  Enc Vitals Group     BP 01/11/20 1132 118/82     Pulse Rate 01/11/20 1132 90     Resp 01/11/20 1132 20      Temp 01/11/20 1132 98.1 F (36.7 C)     Temp Source 01/11/20 1132 Oral     SpO2 01/11/20 1132 97 %     Weight --      Height --      Head Circumference --      Peak Flow --      Pain Score 01/11/20 1141 0     Pain Loc --      Pain Edu? --      Excl. in [place]? --    No data found.  Updated Vital Signs BP 118/82 (BP Location: Left Arm)   Pulse 90   Temp 98.1 F (36.7 C) (Oral)   Resp 20   SpO2 97%   Physical Exam Constitutional:      General: She is not in acute distress.    Appearance: Normal appearance. She is well-developed. She is not toxic-appearing or diaphoretic.  HENT:     Head: Normocephalic and atraumatic.  Eyes:     Conjunctiva/sclera: Conjunctivae normal.     Pupils: Pupils are equal, round, and reactive to light.  Cardiovascular:     Rate and Rhythm: Normal rate and regular rhythm.  Pulmonary:     Effort: Pulmonary effort is normal. No respiratory distress.     Comments: LCTAB Musculoskeletal:     Cervical back: Normal range of motion and neck supple.  Skin:    General: Skin is warm and dry.  Neurological:     Mental Status: She is alert and oriented to person, place, and time.      UC Treatments / Results  Labs (all labs ordered are listed, but only abnormal results are displayed) Labs Reviewed  NOVEL CORONAVIRUS, NAA    EKG   Radiology No results found.  Procedures Procedures (including critical care time)  Medications Ordered in UC Medications - No data to display  Initial Impression / Assessment and Plan / UC Course  I have reviewed the triage vital signs and the nursing notes.  Pertinent labs & imaging results that were available during my care of the patient were reviewed by me and considered in my medical decision making (see chart for details).    Patient requiring Covid testing prior to returning to work, Covid testing ordered.  Otherwise at this time patient afebrile, nontoxic.  Lungs clear to  auscultation bilaterally without  adventitious lung sounds.  Will start prednisone for residual cough, other symptomatic treatment added.  Return precautions given.  Patient expresses understanding and agrees to plan.  Final Clinical Impressions(s) / UC Diagnoses   Final diagnoses:  Acute bronchitis, unspecified organism   ED Prescriptions    Medication Sig Dispense Auth. Provider   predniSONE (DELTASONE) 50 MG tablet Take 1 tablet (50 mg total) by mouth daily with breakfast. 5 tablet Amerie Beaumont V, PA-C   azelastine (ASTELIN) 0.1 % nasal spray Place 2 sprays into both nostrils 2 (two) times daily. 30 mL Dailyn Kempner V, PA-C   albuterol (VENTOLIN HFA) 108 (90 Base) MCG/ACT inhaler Inhale 1-2 puffs into the lungs every 6 (six) hours as needed for wheezing or shortness of breath. 8 g Ok Edwards, PA-C     PDMP not reviewed this encounter.   Ok Edwards, PA-C 01/11/20 1222

## 2020-01-13 LAB — SARS-COV-2, NAA 2 DAY TAT

## 2020-01-13 LAB — NOVEL CORONAVIRUS, NAA: SARS-CoV-2, NAA: NOT DETECTED

## 2020-01-26 ENCOUNTER — Other Ambulatory Visit (INDEPENDENT_AMBULATORY_CARE_PROVIDER_SITE_OTHER): Payer: Self-pay | Admitting: Primary Care

## 2020-01-26 NOTE — Telephone Encounter (Signed)
Requested medication (s) are due for refill today:no  Requested medication (s) are on the active medication list: yes  Last refill:  11/23/2019  Future visit scheduled: no  Notes to clinic:  this refill cannot be delegated    Requested Prescriptions  Pending Prescriptions Disp Refills   phentermine (ADIPEX-P) 37.5 MG tablet [Pharmacy Med Name: PHENTERMINE 37.5MG  TABLETS] 30 tablet     Sig: TAKE 1 TABLET(37.5 MG) BY MOUTH DAILY BEFORE BREAKFAST      Not Delegated - Gastroenterology:  Antiobesity Agents Failed - 01/26/2020  1:25 PM      Failed - This refill cannot be delegated      Passed - Last BP in normal range    BP Readings from Last 1 Encounters:  01/11/20 118/82          Passed - Last Heart Rate in normal range    Pulse Readings from Last 1 Encounters:  01/11/20 90          Passed - Valid encounter within last 12 months    Recent Outpatient Visits           1 month ago Cough productive of clear sputum   [place] Kerin Perna, NP   2 months ago Cough productive of clear sputum   [facility] Kerin Perna, NP   3 months ago Pedal edema   [facility] Kerin Perna, NP   4 months ago Morbid obesity ([place])   [place] Kerin Perna, NP   1 year ago Morbid obesity ([place])   [place] Kerin Perna, NP

## 2020-03-07 ENCOUNTER — Other Ambulatory Visit: Payer: Medicaid Other

## 2020-03-07 DIAGNOSIS — Z20822 Contact with and (suspected) exposure to covid-19: Secondary | ICD-10-CM

## 2020-03-08 LAB — NOVEL CORONAVIRUS, NAA: SARS-CoV-2, NAA: NOT DETECTED

## 2020-03-08 LAB — SARS-COV-2, NAA 2 DAY TAT

## 2020-04-06 ENCOUNTER — Other Ambulatory Visit: Payer: Self-pay | Admitting: Primary Care

## 2020-04-06 DIAGNOSIS — Z1231 Encounter for screening mammogram for malignant neoplasm of breast: Secondary | ICD-10-CM

## 2020-04-13 ENCOUNTER — Telehealth: Payer: Self-pay | Admitting: Primary Care

## 2020-04-13 NOTE — Telephone Encounter (Signed)
Our office has not been able to reach patient as well to schedule OV for incontinence.

## 2020-04-13 NOTE — Telephone Encounter (Signed)
Copied from [place] [id number]. Topic: General - Other >> Apr 13, 2020 10:14 AM Oneta Rack wrote: Osvaldo Human name: Amy  Relation to pt: [facility]  Call back number: [phone]    Reason for call:  Unable to contact patient to provide incontinence supplies.

## 2020-05-12 ENCOUNTER — Other Ambulatory Visit: Payer: Self-pay

## 2020-05-12 ENCOUNTER — Ambulatory Visit
Admission: RE | Admit: 2020-05-12 | Discharge: 2020-05-12 | Disposition: A | Discharge status: Home / Self Care | Payer: Commercial Managed Care - PPO | Source: Ambulatory Visit | Attending: Primary Care | Admitting: Primary Care

## 2020-05-12 DIAGNOSIS — Z1231 Encounter for screening mammogram for malignant neoplasm of breast: Secondary | ICD-10-CM

## 2020-06-21 ENCOUNTER — Other Ambulatory Visit: Payer: Self-pay

## 2020-06-21 ENCOUNTER — Other Ambulatory Visit (INDEPENDENT_AMBULATORY_CARE_PROVIDER_SITE_OTHER): Payer: Self-pay | Admitting: Primary Care

## 2020-06-21 ENCOUNTER — Ambulatory Visit
Admission: EM | Admit: 2020-06-21 | Discharge: 2020-06-21 | Disposition: A | Discharge status: Home / Self Care | Payer: Commercial Managed Care - PPO | Attending: Emergency Medicine | Admitting: Emergency Medicine

## 2020-06-21 DIAGNOSIS — T8130XA Disruption of wound, unspecified, initial encounter: Secondary | ICD-10-CM

## 2020-06-21 MED ORDER — CEFTRIAXONE SODIUM 1 G IJ SOLR
1.0000 g | Freq: Once | INTRAMUSCULAR | Status: AC
  Administered 2020-06-21: 1 g via INTRAMUSCULAR

## 2020-06-21 MED ORDER — DOXYCYCLINE HYCLATE 100 MG PO CAPS: 100.0000 mg | ORAL_CAPSULE | Freq: Two times a day (BID) | ORAL | 0 refills | Status: AC

## 2020-06-21 NOTE — Discharge Instructions (Signed)
Begin doxycycline twice daily for the next 10 days Keep wound clean and dry Follow-up if wound is not healing, developing increased pain swelling or drainage from this area or if you are developing any fevers dizziness or lightheadedness

## 2020-06-21 NOTE — ED Triage Notes (Addendum)
Pt states she tested covid positive on 12/29 with no sx's. States on 06/12/20 developed fever/SOB/cough/nasal drip. States treating her chills with a heating pad to lower abdomen and burnt herself. Pt c/o wound to lower abdomen since 01/03 with drainage and now an odor. States the wound is around her c-section scar where she is numb and can't feel pain there.  Pt states still having nasal drip and SOB at times.

## 2020-06-21 NOTE — ED Provider Notes (Signed)
[name]    [name]: [id number] Arrival date & time: 06/21/20  1621      History   Chief Complaint Chief Complaint  Patient presents with  . Wound Check    HPI [name] is a 45 y.o. female presenting today for evaluation of wound check.  Patient recently had COVID, tested positive on 12/29, symptoms developed on 06/12/2020.  She was having a lot of of fever and body aches with chills.  Had heating pad applied to the lower abdomen and ended up developing a burn to this area.  Has had 4 C-sections along with hernia repair and reports that her lower abdomen has persisted numbness.  She has noticed over the past week she has developed increased scabbing drainage and odor.  Denies any fevers.  HPI  Past Medical History:  Diagnosis Date  . Cervical dysplasia 1993   . Migraine   . Obesity     Patient Active Problem List   Diagnosis Date Noted  . Exercise-induced asthma 07/21/2018  . Exertional dyspnea 12/15/2017  . Elevated troponin   . Mild obstructive sleep apnea 02/14/2015  . Snoring 11/14/2014  . Inguinal adenopathy 10/31/2014  . Positive D dimer 10/17/2014  . Pedal edema 10/14/2014  . Fatigue 10/14/2014  . Morbid obesity ([place]) 08/07/2006  . TOBACCO DEPENDENCE 08/07/2006  . MIGRAINE, UNSPEC., W/O INTRACTABLE MIGRAINE 08/07/2006  . RHINITIS, ALLERGIC 08/07/2006    Past Surgical History:  Procedure Laterality Date  . CESAREAN SECTION     x 4  . COLPOSCOPY    . TUBAL LIGATION Bilateral 2006  . UMBILICAL HERNIA REPAIR  2007    OB History    Gravida  6   Para  4   Term  4   Preterm      AB  2   Living  4     SAB  1   IAB  1   Ectopic      Multiple      Live Births  4            Home Medications    Prior to Admission medications   Medication Sig Start Date End Date Taking? Authorizing Provider  doxycycline (VIBRAMYCIN) 100 MG capsule Take 1 capsule (100 mg total) by mouth 2 (two) times daily for 10 days. 06/21/20 07/01/20  Yes Lyle Niblett C, PA-C  albuterol (VENTOLIN HFA) 108 (90 Base) MCG/ACT inhaler Inhale 1-2 puffs into the lungs every 6 (six) hours as needed for wheezing or shortness of breath. 01/11/20   Tasia Catchings, Amy V, PA-C  azelastine (ASTELIN) 0.1 % nasal spray Place 2 sprays into both nostrils 2 (two) times daily. 01/11/20   Tasia Catchings, Amy V, PA-C  furosemide (LASIX) 20 MG tablet Take 1 tablet (20 mg total) by mouth daily. 10/06/19   Kerin Perna, NP  levocetirizine (XYZAL) 5 MG tablet Take 1 tablet (5 mg total) by mouth every evening. 12/07/19   Kerin Perna, NP  phentermine (ADIPEX-P) 37.5 MG tablet TAKE 1 TABLET(37.5 MG) BY MOUTH DAILY BEFORE BREAKFAST 01/27/20   Kerin Perna, NP    Family History Family History  Problem Relation Age of Onset  . Ovarian cancer Mother 28  . Heart failure Mother   . Depression Mother   . Cancer Mother        uterine cancer   . Heart failure Father 6       MI  . Diabetes Father  type 2  . Hypertension Father   . Heart disease Sister     Social History Social History   Tobacco Use  . Smoking status: Former Smoker    Packs/day: 0.50    Quit date: 06/08/2018    Years since quitting: 2.0  . Smokeless tobacco: Never Used  Substance Use Topics  . Alcohol use: No    Alcohol/week: 0.0 standard drinks  . Drug use: No     Allergies   Iodinated diagnostic agents and Morphine and related   Review of Systems Review of Systems  Constitutional: Negative for fatigue and fever.  HENT: Negative for mouth sores.   Eyes: Negative for visual disturbance.  Respiratory: Negative for shortness of breath.   Cardiovascular: Negative for chest pain.  Gastrointestinal: Negative for abdominal pain, nausea and vomiting.  Genitourinary: Negative for genital sores.  Musculoskeletal: Negative for arthralgias and joint swelling.  Skin: Positive for color change and wound. Negative for rash.  Neurological: Negative for dizziness, weakness, light-headedness and  headaches.     Physical Exam Triage Vital Signs ED Triage Vitals  Enc Vitals Group     BP 06/21/20 1843 (!) 144/81     Pulse Rate 06/21/20 1843 97     Resp 06/21/20 1843 18     Temp 06/21/20 1843 97.9 F (36.6 C)     Temp Source 06/21/20 1843 Oral     SpO2 06/21/20 1843 99 %     Weight --      Height --      Head Circumference --      Peak Flow --      Pain Score 06/21/20 1844 7     Pain Loc --      Pain Edu? --      Excl. in [place]? --    No data found.  Updated Vital Signs BP (!) 144/81 (BP Location: Left Arm)   Pulse 97   Temp 97.9 F (36.6 C) (Oral)   Resp 18   SpO2 99%   Visual Acuity Right Eye Distance:   Left Eye Distance:   Bilateral Distance:    Right Eye Near:   Left Eye Near:    Bilateral Near:     Physical Exam Vitals and nursing note reviewed.  Constitutional:      Appearance: She is well-developed and well-nourished.     Comments: No acute distress  HENT:     Head: Normocephalic and atraumatic.     Nose: Nose normal.  Eyes:     Conjunctiva/sclera: Conjunctivae normal.  Cardiovascular:     Rate and Rhythm: Normal rate.  Pulmonary:     Effort: Pulmonary effort is normal. No respiratory distress.  Abdominal:     General: There is no distension.  Musculoskeletal:        General: Normal range of motion.     Cervical back: Neck supple.  Skin:    General: Skin is warm and dry.     Comments: Lower abdomen with 5.75 cm length scabbed area, inferior portion approximately 2 cm wide, superior portion approximately 1 cm wide, superior portion also with some wound dehiscence and appears slightly moist, mild induration without significant erythema noted around wound, no palpable abscess noted  Neurological:     Mental Status: She is alert and oriented to person, place, and time.  Psychiatric:        Mood and Affect: Mood and affect normal.      UC Treatments / Results  Labs (all labs  ordered are listed, but only abnormal results are  displayed) Labs Reviewed - No data to display  EKG   Radiology No results found.  Procedures Procedures (including critical care time)  Medications Ordered in UC Medications  cefTRIAXone (ROCEPHIN) injection 1 g (1 g Intramuscular Given 06/21/20 1922)    Initial Impression / Assessment and Plan / UC Course  I have reviewed the triage vital signs and the nursing notes.  Pertinent labs & imaging results that were available during my care of the patient were reviewed by me and considered in my medical decision making (see chart for details).     Opted to treat for wound infection, overall wound appears dry, but wound does appear to be becoming deeper compared to patient's prior pictures she shows on phone.  Providing Rocephin prior to discharge and continuing on doxycycline.  Advised patient to follow-up in the emergency room if developing worsening symptoms or any systemic symptoms.  Vital signs stable today.  Discussed strict return precautions. Patient verbalized understanding and is agreeable with plan.  Final Clinical Impressions(s) / UC Diagnoses   Final diagnoses:  Abdominal wound dehiscence, initial encounter     Discharge Instructions     Begin doxycycline twice daily for the next 10 days Keep wound clean and dry Follow-up if wound is not healing, developing increased pain swelling or drainage from this area or if you are developing any fevers dizziness or lightheadedness    ED Prescriptions    Medication Sig Dispense Auth. Provider   doxycycline (VIBRAMYCIN) 100 MG capsule Take 1 capsule (100 mg total) by mouth 2 (two) times daily for 10 days. 20 capsule Creg Gilmer, Stafford C, PA-C     PDMP not reviewed this encounter.   Janith Lima, [place] 06/21/20 1943

## 2020-06-22 NOTE — Telephone Encounter (Signed)
  Notes to clinic Not Delegated  

## 2020-06-28 ENCOUNTER — Other Ambulatory Visit: Payer: Self-pay

## 2020-06-28 ENCOUNTER — Ambulatory Visit
Admission: EM | Admit: 2020-06-28 | Discharge: 2020-06-28 | Disposition: A | Discharge status: Home / Self Care | Payer: Commercial Managed Care - PPO | Attending: Emergency Medicine | Admitting: Emergency Medicine

## 2020-06-28 ENCOUNTER — Telehealth (INDEPENDENT_AMBULATORY_CARE_PROVIDER_SITE_OTHER): Payer: Self-pay

## 2020-06-28 ENCOUNTER — Encounter: Payer: Self-pay | Admitting: Emergency Medicine

## 2020-06-28 ENCOUNTER — Other Ambulatory Visit (INDEPENDENT_AMBULATORY_CARE_PROVIDER_SITE_OTHER): Payer: Self-pay | Admitting: Primary Care

## 2020-06-28 DIAGNOSIS — T8130XA Disruption of wound, unspecified, initial encounter: Secondary | ICD-10-CM

## 2020-06-28 DIAGNOSIS — T2122XA Burn of second degree of abdominal wall, initial encounter: Secondary | ICD-10-CM

## 2020-06-28 MED ORDER — FLUCONAZOLE 150 MG PO TABS: 150.0000 mg | ORAL_TABLET | Freq: Once | ORAL | 0 refills | Status: AC

## 2020-06-28 MED ORDER — AMOXICILLIN-POT CLAVULANATE 875-125 MG PO TABS: 1.0000 | ORAL_TABLET | Freq: Two times a day (BID) | ORAL | 0 refills | Status: DC

## 2020-06-28 MED ORDER — MUPIROCIN 2 % EX OINT: 1.0000 "application " | TOPICAL_OINTMENT | Freq: Two times a day (BID) | CUTANEOUS | 0 refills | Status: DC

## 2020-06-28 NOTE — ED Triage Notes (Signed)
Pt here for wound check after being seen for draining wound to abd area on 1/12; pt sts wound is not improved and still has an odor

## 2020-06-28 NOTE — Telephone Encounter (Signed)
Patient sent a MyChart message wanting to schedule an appointment for a wound infection. I have scheduled the patient an appointment for Jan 31 @ 2:50pm. Patient states that the wound is not getting better and its starting to have a smell. Ms.Glade wants to know if PCP can prescribe any medication to help. Patient was also advised to go to urgent care if PCP was not able to prescribe any medication without being seen.  Please advise [phone]  Thank you

## 2020-06-28 NOTE — Telephone Encounter (Signed)
Does patient need to see PCP or wound care please advise.
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
## 2020-06-28 NOTE — Progress Notes (Unsigned)
Called patient to inform to go to ER for fever, odor , swelling, warmth to touch. These are signs of infection.

## 2020-06-28 NOTE — Discharge Instructions (Addendum)
Continue doxycycline Begin Augmentin twice daily for the next week Please follow-up with wound clinic-2 contacts provided Continue regular dressing changes and keeping area clean and dry  Follow-up for any concerns

## 2020-06-29 ENCOUNTER — Telehealth: Payer: Self-pay | Admitting: *Deleted

## 2020-06-29 NOTE — Telephone Encounter (Signed)
Left message asking patient to return call to [phone].

## 2020-06-29 NOTE — Telephone Encounter (Signed)
Patient verified DOB Patient was advised to go to the ER for concern. Patient was seen on yesterday by UC and prescribed medication and cream. Patient has a referral to wound care and will contact them this morning for 2wk FU appointment. Patient will FU with PCP after appointment with wound care.

## 2020-07-06 ENCOUNTER — Other Ambulatory Visit (INDEPENDENT_AMBULATORY_CARE_PROVIDER_SITE_OTHER): Payer: Self-pay | Admitting: Primary Care

## 2020-07-10 ENCOUNTER — Ambulatory Visit (INDEPENDENT_AMBULATORY_CARE_PROVIDER_SITE_OTHER): Payer: Commercial Managed Care - PPO | Admitting: Primary Care

## 2020-07-13 ENCOUNTER — Ambulatory Visit: Payer: Commercial Managed Care - PPO | Admitting: Physician Assistant

## 2020-07-17 ENCOUNTER — Telehealth (INDEPENDENT_AMBULATORY_CARE_PROVIDER_SITE_OTHER): Payer: Self-pay

## 2020-07-17 NOTE — Telephone Encounter (Signed)
Copied from [place] [id number]. Topic: General - Other >> Jul 17, 2020  1:04 PM Alanda Slim E wrote: Reason for CRM: Pt was written out of work 12.22.21 and Annmarie from [place] called to fin out why pt was written out of work/ please advise  Please advise

## 2020-07-17 NOTE — Telephone Encounter (Signed)
Routed to PCP 

## 2020-07-20 ENCOUNTER — Encounter: Payer: Commercial Managed Care - PPO | Attending: Physician Assistant | Admitting: Physician Assistant

## 2020-07-20 ENCOUNTER — Other Ambulatory Visit: Payer: Self-pay

## 2020-07-20 DIAGNOSIS — W860XXA Exposure to domestic wiring and appliances, initial encounter: Secondary | ICD-10-CM | POA: Diagnosis not present

## 2020-07-20 DIAGNOSIS — T2122XA Burn of second degree of abdominal wall, initial encounter: Secondary | ICD-10-CM | POA: Diagnosis not present

## 2020-07-20 DIAGNOSIS — T2102XA Burn of unspecified degree of abdominal wall, initial encounter: Secondary | ICD-10-CM | POA: Diagnosis present
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
## 2020-07-20 NOTE — Progress Notes (Signed)
RAKEYA, GLAB ([id number]) Visit Report for 07/20/2020 Abuse/Suicide Risk Screen Details Patient Name: [name], [name]. Date of Service: 07/20/2020 10:30 AM Medical Record Number: [id number] Patient Account Number: [id number] Date of Birth/Sex: 1976-03-04 (44 y.o. Female) Treating RN: Carlene Coria Primary Care Jacklynn Dehaas: [name] Other Clinician: Referring Natanel Snavely: Debara Pickett Treating Avyaan Summer/Extender: Skipper Cliche in Treatment: 0 Abuse/Suicide Risk Screen Items Answer ABUSE RISK SCREEN: Has anyone close to you tried to hurt or harm you recentlyo No Do you feel uncomfortable with anyone in your familyo No Has anyone forced you do things that you didnot want to doo No Electronic Signature(s) Signed: 07/20/2020 5:17:48 PM By: Carlene Coria RN Entered By: Carlene Coria on 07/20/2020 11:14:57 [name] ([id number]) -------------------------------------------------------------------------------- Activities of Daily Living Details Patient Name: [name], [name]. Date of Service: 07/20/2020 10:30 AM Medical Record Number: [id number] Patient Account Number: [id number] Date of Birth/Sex: Oct 03, 1975 (44 y.o. Female) Treating RN: Carlene Coria Primary Care Sultan Pargas: [name] Other Clinician: Referring Marlys Stegmaier: Debara Pickett Treating Juno Alers/Extender: Skipper Cliche in Treatment: 0 Activities of Daily Living Items Answer Activities of Daily Living (Please select one for each item) Drive Automobile Completely Able Take Medications Completely Able Use Telephone Completely Able Care for Appearance Completely Able Use Toilet Completely Able Bath / Shower Completely Able Dress Self Completely Able Feed Self Completely Able Walk Completely Able Get In / Out Bed Completely Able Housework Completely Able Prepare Meals Completely Able Handle Money Completely Able Shop for Self Completely Able Electronic Signature(s) Signed: 07/20/2020 5:17:48 PM By:  Carlene Coria RN Entered By: Carlene Coria on 07/20/2020 11:15:40 [name] ([id number]) -------------------------------------------------------------------------------- Education Screening Details Patient Name: [name] Date of Service: 07/20/2020 10:30 AM Medical Record Number: [id number] Patient Account Number: [id number] Date of Birth/Sex: February 18, 1976 (44 y.o. Female) Treating RN: Carlene Coria Primary Care Artis Buechele: [name] Other Clinician: Referring Yeng Frankie: Debara Pickett Treating Montia Haslip/Extender: Skipper Cliche in Treatment: 0 Learning Preferences/Education Level/Primary Language Learning Preference: Explanation Highest Education Level: College or Above Preferred Language: English Cognitive Barrier Language Barrier: No Translator Needed: No Memory Deficit: No Emotional Barrier: No Cultural/Religious Beliefs Affecting Medical Care: No Physical Barrier Impaired Vision: Yes Glasses Impaired Hearing: No Decreased Hand dexterity: No Knowledge/Comprehension Knowledge Level: Medium Comprehension Level: High Ability to understand written instructions: High Ability to understand verbal instructions: High Motivation Anxiety Level: Anxious Cooperation: Cooperative Education Importance: Acknowledges Need Interest in Health Problems: Asks Questions Perception: Coherent Willingness to Engage in Self-Management High Activities: Readiness to Engage in Self-Management High Activities: Electronic Signature(s) Signed: 07/20/2020 5:17:48 PM By: Carlene Coria RN Entered By: Carlene Coria on 07/20/2020 11:16:26 [name] ([id number]) -------------------------------------------------------------------------------- Fall Risk Assessment Details Patient Name: [name] Date of Service: 07/20/2020 10:30 AM Medical Record Number: [id number] Patient Account Number: [id number] Date of Birth/Sex: 05-23-1976 (44 y.o. Female) Treating RN: Carlene Coria Primary Care Stormy Connon: [name] Other Clinician: Referring Tiandra Swoveland: Debara Pickett Treating Wylee Dorantes/Extender: Skipper Cliche in Treatment: 0 Fall Risk Assessment Items Have you had 2 or more falls in the last 12 monthso 0 No Have you had any fall that resulted in injury in the last 12 monthso 0 No FALLS RISK SCREEN History of falling - immediate or within 3 months 0 No Secondary diagnosis (Do you have 2 or more medical diagnoseso) 0 No Ambulatory aid None/bed rest/wheelchair/nurse 0 No Crutches/cane/walker 0 No Furniture 0 No Intravenous therapy Access/Saline/Heparin Lock 0 No Gait/Transferring Normal/ bed rest/ wheelchair 0 No Weak (short steps with or without shuffle,  stooped but able to lift head while walking, may 0 No seek support from furniture) Impaired (short steps with shuffle, may have difficulty arising from chair, head down, impaired 0 No balance) Mental Status Oriented to own ability 0 No Electronic Signature(s) Signed: 07/20/2020 5:17:48 PM By: Carlene Coria RN Entered By: Carlene Coria on 07/20/2020 11:16:52 [name] ([id number]) -------------------------------------------------------------------------------- Foot Assessment Details Patient Name: [name] Date of Service: 07/20/2020 10:30 AM Medical Record Number: [id number] Patient Account Number: [id number] Date of Birth/Sex: January 17, 1976 (44 y.o. Female) Treating RN: Carlene Coria Primary Care Macarena Langseth: [name] Other Clinician: Referring Tynisa Vohs: Debara Pickett Treating Jojuan Champney/Extender: Skipper Cliche in Treatment: 0 Foot Assessment Items Site Locations + = Sensation present, - = Sensation absent, C = Callus, U = Ulcer R = Redness, W = Warmth, M = Maceration, PU = Pre-ulcerative lesion F = Fissure, S = Swelling, D = Dryness Assessment Right: Left: Other Deformity: No No Prior Foot Ulcer: No No Prior Amputation: No No Charcot Joint: No No Ambulatory  Status: Ambulatory Without Help Gait: Steady Electronic Signature(s) Signed: 07/20/2020 5:17:48 PM By: Carlene Coria RN Entered By: Carlene Coria on 07/20/2020 11:17:56 [name] ([id number]) -------------------------------------------------------------------------------- Nutrition Risk Screening Details Patient Name: [name] Date of Service: 07/20/2020 10:30 AM Medical Record Number: [id number] Patient Account Number: [id number] Date of Birth/Sex: 05/06/1976 (44 y.o. Female) Treating RN: Carlene Coria Primary Care Rosette Bellavance: [name] Other Clinician: Referring Avionna Bower: Debara Pickett Treating Rutledge Selsor/Extender: Skipper Cliche in Treatment: 0 Height (in): 67 Weight (lbs): 323 Body Mass Index (BMI): 50.6 Nutrition Risk Screening Items Score Screening NUTRITION RISK SCREEN: I have an illness or condition that made me change the kind and/or amount of food I eat 0 No I eat fewer than two meals per day 0 No I eat few fruits and vegetables, or milk products 0 No I have three or more drinks of beer, liquor or wine almost every day 0 No I have tooth or mouth problems that make it hard for me to eat 0 No I don't always have enough money to buy the food I need 0 No I eat alone most of the time 0 No I take three or more different prescribed or over-the-counter drugs a day 1 Yes Without wanting to, I have lost or gained 10 pounds in the last six months 0 No I am not always physically able to shop, cook and/or feed myself 0 No Nutrition Protocols Good Risk Protocol 0 No interventions needed Moderate Risk Protocol High Risk Proctocol Risk Level: Good Risk Score: 1 Electronic Signature(s) Signed: 07/20/2020 5:17:48 PM By: Carlene Coria RN Entered By: Carlene Coria on 07/20/2020 11:17:30

## 2020-07-20 NOTE — Progress Notes (Signed)
[name], [name] ([id number]) Visit Report for 07/20/2020 Allergy List Details Patient Name: [name], [name]. Date of Service: 07/20/2020 10:30 AM Medical Record Number: [id number] Patient Account Number: [id number] Date of Birth/Sex: 02/17/1976 (45 y.o. Female) Treating RN: Carlene Coria Primary Care Josette Shimabukuro: [name] Other Clinician: Referring Daisha Filosa: Debara Pickett Treating Jaiden Dinkins/Extender: Skipper Cliche in Treatment: 0 Allergies Active Allergies morphine sulfate Type: Food contrast dye Type: Medication Allergy Notes Electronic Signature(s) Signed: 07/20/2020 5:17:48 PM By: Carlene Coria RN Entered By: Carlene Coria on 07/20/2020 11:11:36 [name] ([id number]) -------------------------------------------------------------------------------- Arrival Information Details Patient Name: [name] Date of Service: 07/20/2020 10:30 AM Medical Record Number: [id number] Patient Account Number: [id number] Date of Birth/Sex: 21-Jan-1976 (45 y.o. Female) Treating RN: Carlene Coria Primary Care Selenne Coggin: [name] Other Clinician: Referring Vincentina Sollers: Debara Pickett Treating Pearlee Arvizu/Extender: Skipper Cliche in Treatment: 0 Visit Information Patient Arrived: Ambulatory Arrival Time: 10:49 Accompanied By: self Transfer Assistance: None Patient Identification Verified: Yes Secondary Verification Process Completed: Yes Patient Requires Transmission-Based Precautions: No Patient Has Alerts: No Electronic Signature(s) Signed: 07/20/2020 5:17:48 PM By: Carlene Coria RN Entered By: Carlene Coria on 07/20/2020 10:54:15 [name] ([id number]) -------------------------------------------------------------------------------- Clinic Level of Care Assessment Details Patient Name: [name] Date of Service: 07/20/2020 10:30 AM Medical Record Number: [id number] Patient Account Number: [id number] Date of Birth/Sex: 1976/05/14 (45 y.o. Female) Treating  RN: Dolan Amen Primary Care Kendra Woolford: [name] Other Clinician: Referring Bowman Higbie: Debara Pickett Treating Lyncoln Maskell/Extender: Skipper Cliche in Treatment: 0 Clinic Level of Care Assessment Items TOOL 4 Quantity Score X - Use when only an EandM is performed on FOLLOW-UP visit 1 0 ASSESSMENTS - Nursing Assessment / Reassessment X - Reassessment of Co-morbidities (includes updates in patient status) 1 10 X- 1 5 Reassessment of Adherence to Treatment Plan ASSESSMENTS - Wound and Skin Assessment / Reassessment X - Simple Wound Assessment / Reassessment - one wound 1 5 []  - 0 Complex Wound Assessment / Reassessment - multiple wounds []  - 0 Dermatologic / Skin Assessment (not related to wound area) ASSESSMENTS - Focused Assessment []  - Circumferential Edema Measurements - multi extremities 0 []  - 0 Nutritional Assessment / Counseling / Intervention []  - 0 Lower Extremity Assessment (monofilament, tuning fork, pulses) []  - 0 Peripheral Arterial Disease Assessment (using hand held doppler) ASSESSMENTS - Ostomy and/or Continence Assessment and Care []  - Incontinence Assessment and Management 0 []  - 0 Ostomy Care Assessment and Management (repouching, etc.) PROCESS - Coordination of Care X - Simple Patient / Family Education for ongoing care 1 15 []  - 0 Complex (extensive) Patient / Family Education for ongoing care []  - 0 Staff obtains Programmer, systems, Records, Test Results / Process Orders []  - 0 Staff telephones HHA, Nursing Homes / Clarify orders / etc []  - 0 Routine Transfer to another Facility (non-emergent condition) []  - 0 Routine Hospital Admission (non-emergent condition) []  - 0 New Admissions / Biomedical engineer / Ordering NPWT, Apligraf, etc. []  - 0 Emergency Hospital Admission (emergent condition) X- 1 10 Simple Discharge Coordination []  - 0 Complex (extensive) Discharge Coordination PROCESS - Special Needs []  - Pediatric / Minor Patient  Management 0 []  - 0 Isolation Patient Management []  - 0 Hearing / Language / Visual special needs []  - 0 Assessment of Community assistance (transportation, D/C planning, etc.) []  - 0 Additional assistance / Altered mentation []  - 0 Support Surface(s) Assessment (bed, cushion, seat, etc.) INTERVENTIONS - Wound Cleansing / Measurement [name], [name] ([id number]) X- 1 5 Simple Wound Cleansing -  one wound []  - 0 Complex Wound Cleansing - multiple wounds X- 1 5 Wound Imaging (photographs - any number of wounds) []  - 0 Wound Tracing (instead of photographs) X- 1 5 Simple Wound Measurement - one wound []  - 0 Complex Wound Measurement - multiple wounds INTERVENTIONS - Wound Dressings []  - Small Wound Dressing one or multiple wounds 0 X- 1 15 Medium Wound Dressing one or multiple wounds []  - 0 Large Wound Dressing one or multiple wounds []  - 0 Application of Medications - topical []  - 0 Application of Medications - injection INTERVENTIONS - Miscellaneous []  - External ear exam 0 []  - 0 Specimen Collection (cultures, biopsies, blood, body fluids, etc.) []  - 0 Specimen(s) / Culture(s) sent or taken to Lab for analysis []  - 0 Patient Transfer (multiple staff / Civil Service fast streamer / Similar devices) []  - 0 Simple Staple / Suture removal (25 or less) []  - 0 Complex Staple / Suture removal (26 or more) []  - 0 Hypo / Hyperglycemic Management (close monitor of Blood Glucose) []  - 0 Ankle / Brachial Index (ABI) - do not check if billed separately X- 1 5 Vital Signs Has the patient been seen at the hospital within the last three years: Yes Total Score: 80 Level Of Care: New/Established - Level 3 Electronic Signature(s) Signed: 07/20/2020 12:49:19 PM By: Georges Mouse, Minus Breeding RN Entered By: Georges Mouse, Kenia on 07/20/2020 11:44:52 [name] ([id number]) -------------------------------------------------------------------------------- Encounter Discharge Information  Details Patient Name: [name] Date of Service: 07/20/2020 10:30 AM Medical Record Number: [id number] Patient Account Number: [id number] Date of Birth/Sex: [birth year] (45 y.o. Female) Treating RN: Dolan Amen Primary Care Marina Desire: [name] Other Clinician: Referring Nymir Ringler: Debara Pickett Treating Dillard Pascal/Extender: Skipper Cliche in Treatment: 0 Encounter Discharge Information Items Discharge Condition: Stable Ambulatory Status: Ambulatory Discharge Destination: Home Transportation: Private Auto Accompanied By: self Schedule Follow-up Appointment: Yes Clinical Summary of Care: Electronic Signature(s) Signed: 07/20/2020 11:47:10 AM By: Georges Mouse, Minus Breeding RN Entered By: Georges Mouse, Minus Breeding on 07/20/2020 11:47:10 [name] ([id number]) -------------------------------------------------------------------------------- Lower Extremity Assessment Details Patient Name: [name] Date of Service: 07/20/2020 10:30 AM Medical Record Number: [id number] Patient Account Number: [id number] Date of Birth/Sex: 08/01/1975 (45 y.o. Female) Treating RN: Carlene Coria Primary Care Mychele Seyller: [name] Other Clinician: Referring Mahamadou Weltz: Debara Pickett Treating Genetta Fiero/Extender: Skipper Cliche in Treatment: 0 Electronic Signature(s) Signed: 07/20/2020 5:17:48 PM By: Carlene Coria RN Entered By: Carlene Coria on 07/20/2020 11:09:52 [name] ([id number]) -------------------------------------------------------------------------------- Multi Wound Chart Details Patient Name: [name] Date of Service: 07/20/2020 10:30 AM Medical Record Number: [id number] Patient Account Number: [id number] Date of Birth/Sex: 1975/11/30 (45 y.o. Female) Treating RN: Dolan Amen Primary Care Aluna Whiston: [name] Other Clinician: Referring Eulla Kochanowski: Debara Pickett Treating Shauniece Kwan/Extender: Skipper Cliche in Treatment: 0 Vital  Signs Height(in): 67 Pulse(bpm): 114 Weight(lbs): 323 Blood Pressure(mmHg): 84/61 Body Mass Index(BMI): 51 Temperature(F): 97.9 Respiratory Rate(breaths/min): 18 Photos: [N/A:N/A] Wound Location: Abdomen - midline N/A N/A Wounding Event: Trauma N/A N/A Primary Etiology: 3rd degree Burn N/A N/A Date Acquired: 06/07/2020 N/A N/A Weeks of Treatment: 0 N/A N/A Wound Status: Open N/A N/A Measurements L x W x D (cm) 1.7x3.5x0.2 N/A N/A Area (cm) : 4.673 N/A N/A Volume (cm) : 0.935 N/A N/A Classification: Full Thickness Without Exposed N/A N/A Support Structures Exudate Amount: Medium N/A N/A Exudate Type: Serosanguineous N/A N/A Exudate Color: red, brown N/A N/A Granulation Amount: Small (1-33%) N/A N/A Granulation Quality: Pink N/A N/A Necrotic Amount: Large (67-100%) N/A  N/A Exposed Structures: Fat Layer (Subcutaneous Tissue): N/A N/A Yes Fascia: No Tendon: No Muscle: No Joint: No Bone: No Epithelialization: None N/A N/A Treatment Notes Electronic Signature(s) Signed: 07/20/2020 12:49:19 PM By: Georges Mouse, Minus Breeding RN Entered By: Georges Mouse, Minus Breeding on 07/20/2020 11:36:25 [name] ([id number]) -------------------------------------------------------------------------------- Multi-Disciplinary Care Plan Details Patient Name: [name] Date of Service: 07/20/2020 10:30 AM Medical Record Number: [id number] Patient Account Number: [id number] Date of Birth/Sex: 21-Feb-1976 (45 y.o. Female) Treating RN: Dolan Amen Primary Care Blythe Veach: [name] Other Clinician: Referring Astria Jordahl: Debara Pickett Treating Adayah Arocho/Extender: Skipper Cliche in Treatment: 0 Active Inactive Necrotic Tissue Nursing Diagnoses: Impaired tissue integrity related to necrotic/devitalized tissue Goals: Necrotic/devitalized tissue will be minimized in the wound bed Date Initiated: 07/20/2020 Target Resolution Date: 07/20/2020 Goal Status: Active Patient/caregiver  will verbalize understanding of reason and process for debridement of necrotic tissue Date Initiated: 07/20/2020 Target Resolution Date: 07/20/2020 Goal Status: Active Interventions: Assess patient pain level pre-, during and post procedure and prior to discharge Provide education on necrotic tissue and debridement process Treatment Activities: Apply topical anesthetic as ordered : 07/20/2020 Excisional debridement : 07/20/2020 Notes: Wound/Skin Impairment Nursing Diagnoses: Impaired tissue integrity Goals: Patient/caregiver will verbalize understanding of skin care regimen Date Initiated: 07/20/2020 Target Resolution Date: 07/20/2020 Goal Status: Active Ulcer/skin breakdown will have a volume reduction of 30% by week 4 Date Initiated: 07/20/2020 Target Resolution Date: 08/17/2020 Goal Status: Active Ulcer/skin breakdown will have a volume reduction of 50% by week 8 Date Initiated: 07/20/2020 Target Resolution Date: 09/17/2020 Goal Status: Active Interventions: Assess patient/caregiver ability to obtain necessary supplies Assess patient/caregiver ability to perform ulcer/skin care regimen upon admission and as needed Assess ulceration(s) every visit Provide education on ulcer and skin care Treatment Activities: Skin care regimen initiated : 07/20/2020 Topical wound management initiated : 07/20/2020 Notes: Electronic Signature(s) Signed: 07/20/2020 12:49:19 PM By: Georges Mouse, Minus Breeding RN [name], [name] ([id number]) Entered By: Georges Mouse, Minus Breeding on 07/20/2020 11:36:13 [name] ([id number]) -------------------------------------------------------------------------------- Pain Assessment Details Patient Name: [name] Date of Service: 07/20/2020 10:30 AM Medical Record Number: [id number] Patient Account Number: [id number] Date of Birth/Sex: 11/16/1975 (45 y.o. Female) Treating RN: Carlene Coria Primary Care Kendre Jacinto: [name] Other Clinician: Referring  Omere Marti: Debara Pickett Treating Bruno Leach/Extender: Skipper Cliche in Treatment: 0 Active Problems Location of Pain Severity and Description of Pain Patient Has Paino No Site Locations Pain Management and Medication Current Pain Management: Electronic Signature(s) Signed: 07/20/2020 5:17:48 PM By: Carlene Coria RN Entered By: Carlene Coria on 07/20/2020 10:55:44 [name] ([id number]) -------------------------------------------------------------------------------- Patient/Caregiver Education Details Patient Name: [name] Date of Service: 07/20/2020 10:30 AM Medical Record Number: [id number] Patient Account Number: [id number] Date of Birth/Gender: 1975-06-25 (45 y.o. Female) Treating RN: Dolan Amen Primary Care Physician: [name] Other Clinician: Referring Physician: Debara Pickett Treating Physician/Extender: Skipper Cliche in Treatment: 0 Education Assessment Education Provided To: Patient Education Topics Provided Wound/Skin Impairment: Methods: Explain/Verbal Responses: State content correctly Electronic Signature(s) Signed: 07/20/2020 12:49:19 PM By: Georges Mouse, Minus Breeding RN Entered By: Georges Mouse, Minus Breeding on 07/20/2020 11:45:14 [name] ([id number]) -------------------------------------------------------------------------------- Wound Assessment Details Patient Name: [name] Date of Service: 07/20/2020 10:30 AM Medical Record Number: [id number] Patient Account Number: [id number] Date of Birth/Sex: 09-16-1975 (45 y.o. Female) Treating RN: Carlene Coria Primary Care Dejah Droessler: [name] Other Clinician: Referring Sharyl Panchal: Debara Pickett Treating Danyiel Crespin/Extender: Skipper Cliche in Treatment: 0 Wound Status Wound Number: 1 Primary Etiology: 3rd degree Burn Wound Location: Abdomen - midline Wound  Status: Open Wounding Event: Trauma Date Acquired: 06/07/2020 Weeks Of Treatment: 0 Clustered Wound:  No Photos Wound Measurements Length: (cm) 1.7 Width: (cm) 3.5 Depth: (cm) 0.2 Area: (cm) 4.673 Volume: (cm) 0.935 % Reduction in Area: % Reduction in Volume: Epithelialization: None Tunneling: No Undermining: No Wound Description Classification: Full Thickness Without Exposed Support Structu Exudate Amount: Medium Exudate Type: Serosanguineous Exudate Color: red, brown res Foul Odor After Cleansing: No Slough/Fibrino Yes Wound Bed Granulation Amount: Small (1-33%) Exposed Structure Granulation Quality: Pink Fascia Exposed: No Necrotic Amount: Large (67-100%) Fat Layer (Subcutaneous Tissue) Exposed: Yes Necrotic Quality: Adherent Slough Tendon Exposed: No Muscle Exposed: No Joint Exposed: No Bone Exposed: No Electronic Signature(s) Signed: 07/20/2020 5:17:48 PM By: Carlene Coria RN Entered By: Carlene Coria on 07/20/2020 11:08:36 [name] ([id number]) -------------------------------------------------------------------------------- Vitals Details Patient Name: [name] Date of Service: 07/20/2020 10:30 AM Medical Record Number: [id number] Patient Account Number: [id number] Date of Birth/Sex: [birth year] (45 y.o. Female) Treating RN: Carlene Coria Primary Care Lucca Ballo: [name] Other Clinician: Referring Jerricka Carvey: Debara Pickett Treating Areta Terwilliger/Extender: Skipper Cliche in Treatment: 0 Vital Signs Time Taken: 10:55 Temperature (F): 97.9 Height (in): 67 Pulse (bpm): 114 Source: Stated Respiratory Rate (breaths/min): 18 Weight (lbs): 323 Blood Pressure (mmHg): 84/61 Body Mass Index (BMI): 50.6 Reference Range: 80 - 120 mg / dl Electronic Signature(s) Signed: 07/20/2020 5:17:48 PM By: Carlene Coria RN Entered By: Carlene Coria on 07/20/2020 10:57:18

## 2020-07-21 ENCOUNTER — Encounter (INDEPENDENT_AMBULATORY_CARE_PROVIDER_SITE_OTHER): Payer: Self-pay | Admitting: Primary Care

## 2020-07-27 ENCOUNTER — Encounter: Payer: Commercial Managed Care - PPO | Admitting: Physician Assistant

## 2020-07-27 ENCOUNTER — Other Ambulatory Visit: Payer: Self-pay

## 2020-07-27 DIAGNOSIS — T2122XA Burn of second degree of abdominal wall, initial encounter: Secondary | ICD-10-CM | POA: Diagnosis not present
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
## 2020-07-31 NOTE — Progress Notes (Signed)
GLADYCE, MCRAY ([id number]) Visit Report for 07/27/2020 Arrival Information Details Patient Name: [name], REGISTER. Date of Service: 07/27/2020 12:45 PM Medical Record Number: [id number] Patient Account Number: [id number] Date of Birth/Sex: 10/19/1975 (45 y.o. F) Treating RN: Carlene Coria Primary Care Copelyn Widmer: [name] Other Clinician: Referring Oceana Walthall: [name] Treating Cordarrel Stiefel/Extender: Skipper Cliche in Treatment: 1 Visit Information History Since Last Visit All ordered tests and consults were completed: No Patient Arrived: Ambulatory Added or deleted any medications: No Arrival Time: 12:47 Any new allergies or adverse reactions: No Accompanied By: self Had a fall or experienced change in No Transfer Assistance: None activities of daily living that may affect Patient Identification Verified: Yes risk of falls: Secondary Verification Process Completed: Yes Signs or symptoms of abuse/neglect since last visito No Patient Requires Transmission-Based Precautions: No Hospitalized since last visit: No Patient Has Alerts: No Implantable device outside of the clinic excluding No cellular tissue based products placed in the center since last visit: Has Dressing in Place as Prescribed: Yes Pain Present Now: No Electronic Signature(s) Signed: 07/31/2020 4:57:18 PM By: Carlene Coria RN Entered By: Carlene Coria on 07/27/2020 12:48:17 [name] ([id number]) -------------------------------------------------------------------------------- Clinic Level of Care Assessment Details Patient Name: [name] Date of Service: 07/27/2020 12:45 PM Medical Record Number: [id number] Patient Account Number: [id number] Date of Birth/Sex: 1976-05-31 (45 y.o. F) Treating RN: Dolan Amen Primary Care Analissa Bayless: [name] Other Clinician: Referring Rithy Mandley: [name] Treating Dawt Reeb/Extender: Skipper Cliche in Treatment: 1 Clinic Level of Care  Assessment Items TOOL 4 Quantity Score X - Use when only an EandM is performed on FOLLOW-UP visit 1 0 ASSESSMENTS - Nursing Assessment / Reassessment X - Reassessment of Co-morbidities (includes updates in patient status) 1 10 X- 1 5 Reassessment of Adherence to Treatment Plan ASSESSMENTS - Wound and Skin Assessment / Reassessment X - Simple Wound Assessment / Reassessment - one wound 1 5 []  - 0 Complex Wound Assessment / Reassessment - multiple wounds []  - 0 Dermatologic / Skin Assessment (not related to wound area) ASSESSMENTS - Focused Assessment []  - Circumferential Edema Measurements - multi extremities 0 []  - 0 Nutritional Assessment / Counseling / Intervention []  - 0 Lower Extremity Assessment (monofilament, tuning fork, pulses) []  - 0 Peripheral Arterial Disease Assessment (using hand held doppler) ASSESSMENTS - Ostomy and/or Continence Assessment and Care []  - Incontinence Assessment and Management 0 []  - 0 Ostomy Care Assessment and Management (repouching, etc.) PROCESS - Coordination of Care X - Simple Patient / Family Education for ongoing care 1 15 []  - 0 Complex (extensive) Patient / Family Education for ongoing care []  - 0 Staff obtains Programmer, systems, Records, Test Results / Process Orders []  - 0 Staff telephones HHA, Nursing Homes / Clarify orders / etc []  - 0 Routine Transfer to another Facility (non-emergent condition) []  - 0 Routine Hospital Admission (non-emergent condition) []  - 0 New Admissions / Biomedical engineer / Ordering NPWT, Apligraf, etc. []  - 0 Emergency Hospital Admission (emergent condition) X- 1 10 Simple Discharge Coordination []  - 0 Complex (extensive) Discharge Coordination PROCESS - Special Needs []  - Pediatric / Minor Patient Management 0 []  - 0 Isolation Patient Management []  - 0 Hearing / Language / Visual special needs []  - 0 Assessment of Community assistance (transportation, D/C planning, etc.) []  - 0 Additional  assistance / Altered mentation []  - 0 Support Surface(s) Assessment (bed, cushion, seat, etc.) INTERVENTIONS - Wound Cleansing / Measurement Baumgarner, Jerry N. ([id number]) X- 1 5 Simple Wound Cleansing -  one wound []  - 0 Complex Wound Cleansing - multiple wounds X- 1 5 Wound Imaging (photographs - any number of wounds) []  - 0 Wound Tracing (instead of photographs) X- 1 5 Simple Wound Measurement - one wound []  - 0 Complex Wound Measurement - multiple wounds INTERVENTIONS - Wound Dressings []  - Small Wound Dressing one or multiple wounds 0 X- 1 15 Medium Wound Dressing one or multiple wounds []  - 0 Large Wound Dressing one or multiple wounds []  - 0 Application of Medications - topical []  - 0 Application of Medications - injection INTERVENTIONS - Miscellaneous []  - External ear exam 0 []  - 0 Specimen Collection (cultures, biopsies, blood, body fluids, etc.) []  - 0 Specimen(s) / Culture(s) sent or taken to Lab for analysis []  - 0 Patient Transfer (multiple staff / Civil Service fast streamer / Similar devices) []  - 0 Simple Staple / Suture removal (25 or less) []  - 0 Complex Staple / Suture removal (26 or more) []  - 0 Hypo / Hyperglycemic Management (close monitor of Blood Glucose) []  - 0 Ankle / Brachial Index (ABI) - do not check if billed separately X- 1 5 Vital Signs Has the patient been seen at the hospital within the last three years: Yes Total Score: 80 Level Of Care: New/Established - Level 3 Electronic Signature(s) Signed: 07/27/2020 5:25:46 PM By: Georges Mouse, Minus Breeding RN Entered By: Georges Mouse, Kenia on 07/27/2020 13:05:07 [name] ([id number]) -------------------------------------------------------------------------------- Encounter Discharge Information Details Patient Name: [name] Date of Service: 07/27/2020 12:45 PM Medical Record Number: [id number] Patient Account Number: [id number] Date of Birth/Sex: 1975-11-10 (45 y.o. F) Treating RN:  Dolan Amen Primary Care Taylorann Tkach: [name] Other Clinician: Referring Wadell Craddock: [name] Treating Greidys Deland/Extender: Skipper Cliche in Treatment: 1 Encounter Discharge Information Items Discharge Condition: Stable Ambulatory Status: Ambulatory Discharge Destination: Home Transportation: Private Auto Accompanied By: self Schedule Follow-up Appointment: Yes Clinical Summary of Care: Electronic Signature(s) Signed: 07/27/2020 5:25:46 PM By: Georges Mouse, Minus Breeding RN Entered By: Georges Mouse, Minus Breeding on 07/27/2020 13:05:54 [name] ([id number]) -------------------------------------------------------------------------------- Lower Extremity Assessment Details Patient Name: [name] Date of Service: 07/27/2020 12:45 PM Medical Record Number: [id number] Patient Account Number: [id number] Date of Birth/Sex: [birth year] (45 y.o. F) Treating RN: Carlene Coria Primary Care Aram Domzalski: [name] Other Clinician: Referring Citlally Captain: [name] Treating Raeli Wiens/Extender: Skipper Cliche in Treatment: 1 Electronic Signature(s) Signed: 07/31/2020 4:57:18 PM By: Carlene Coria RN Entered By: Carlene Coria on 07/27/2020 12:54:27 [name] ([id number]) -------------------------------------------------------------------------------- Multi Wound Chart Details Patient Name: [name] Date of Service: 07/27/2020 12:45 PM Medical Record Number: [id number] Patient Account Number: [id number] Date of Birth/Sex: 24-Mar-1976 (45 y.o. F) Treating RN: Dolan Amen Primary Care Yesica Kemler: [name] Other Clinician: Referring Hailey Miles: [name] Treating Cieanna Stormes/Extender: Skipper Cliche in Treatment: 1 Vital Signs Height(in): 67 Pulse(bpm): 97 Weight(lbs): 323 Blood Pressure(mmHg): 146/83 Body Mass Index(BMI): 51 Temperature(F): 98.5 Respiratory Rate(breaths/min): 18 Photos: [N/A:N/A] Wound Location: Abdomen - midline  N/A N/A Wounding Event: Trauma N/A N/A Primary Etiology: 2nd degree Burn N/A N/A Date Acquired: 06/07/2020 N/A N/A Weeks of Treatment: 1 N/A N/A Wound Status: Open N/A N/A Measurements L x W x D (cm) 1x3x0.1 N/A N/A Area (cm) : 2.356 N/A N/A Volume (cm) : 0.236 N/A N/A % Reduction in Area: 49.60% N/A N/A % Reduction in Volume: 74.80% N/A N/A Classification: Full Thickness Without Exposed N/A N/A Support Structures Exudate Amount: Medium N/A N/A Exudate Type: Serosanguineous N/A N/A Exudate Color: red, brown N/A N/A Granulation Amount:  Small (1-33%) N/A N/A Granulation Quality: Pink N/A N/A Necrotic Amount: Large (67-100%) N/A N/A Exposed Structures: Fat Layer (Subcutaneous Tissue): N/A N/A Yes Fascia: No Tendon: No Muscle: No Joint: No Bone: No Epithelialization: None N/A N/A Treatment Notes Electronic Signature(s) Signed: 07/27/2020 5:25:46 PM By: Georges Mouse, Minus Breeding RN Entered By: Georges Mouse, Minus Breeding on 07/27/2020 13:03:36 [name] ([id number]) -------------------------------------------------------------------------------- Multi-Disciplinary Care Plan Details Patient Name: [name] Date of Service: 07/27/2020 12:45 PM Medical Record Number: [id number] Patient Account Number: [id number] Date of Birth/Sex: [birth year] (45 y.o. F) Treating RN: Dolan Amen Primary Care Jeiry Birnbaum: [name] Other Clinician: Referring Shalin Linders: [name] Treating Debany Vantol/Extender: Skipper Cliche in Treatment: 1 Active Inactive Necrotic Tissue Nursing Diagnoses: Impaired tissue integrity related to necrotic/devitalized tissue Goals: Necrotic/devitalized tissue will be minimized in the wound bed Date Initiated: 07/20/2020 Target Resolution Date: 07/20/2020 Goal Status: Active Patient/caregiver will verbalize understanding of reason and process for debridement of necrotic tissue Date Initiated: 07/20/2020 Target Resolution Date: 07/20/2020 Goal  Status: Active Interventions: Assess patient pain level pre-, during and post procedure and prior to discharge Provide education on necrotic tissue and debridement process Treatment Activities: Apply topical anesthetic as ordered : 07/20/2020 Excisional debridement : 07/20/2020 Notes: Wound/Skin Impairment Nursing Diagnoses: Impaired tissue integrity Goals: Patient/caregiver will verbalize understanding of skin care regimen Date Initiated: 07/20/2020 Date Inactivated: 07/27/2020 Target Resolution Date: 07/20/2020 Goal Status: Met Ulcer/skin breakdown will have a volume reduction of 30% by week 4 Date Initiated: 07/20/2020 Target Resolution Date: 08/17/2020 Goal Status: Active Ulcer/skin breakdown will have a volume reduction of 50% by week 8 Date Initiated: 07/20/2020 Target Resolution Date: 09/17/2020 Goal Status: Active Interventions: Assess patient/caregiver ability to obtain necessary supplies Assess patient/caregiver ability to perform ulcer/skin care regimen upon admission and as needed Assess ulceration(s) every visit Provide education on ulcer and skin care Treatment Activities: Skin care regimen initiated : 07/20/2020 Topical wound management initiated : 07/20/2020 Notes: Electronic Signature(s) Signed: 07/27/2020 5:25:46 PM By: Georges Mouse, [place], Dawn. ([id number]) Entered By: Georges Mouse, Minus Breeding on 07/27/2020 13:03:25 [name] ([id number]) -------------------------------------------------------------------------------- Pain Assessment Details Patient Name: [name] Date of Service: 07/27/2020 12:45 PM Medical Record Number: [id number] Patient Account Number: [id number] Date of Birth/Sex: 1975/07/26 (45 y.o. F) Treating RN: Carlene Coria Primary Care Curby Carswell: [name] Other Clinician: Referring Satsuki Zillmer: [name] Treating Nixxon Faria/Extender: Skipper Cliche in Treatment: 1 Active Problems Location of Pain Severity  and Description of Pain Patient Has Paino No Site Locations Pain Management and Medication Current Pain Management: Electronic Signature(s) Signed: 07/31/2020 4:57:18 PM By: Carlene Coria RN Entered By: Carlene Coria on 07/27/2020 12:48:55 [name] ([id number]) -------------------------------------------------------------------------------- Patient/Caregiver Education Details Patient Name: [name] Date of Service: 07/27/2020 12:45 PM Medical Record Number: [id number] Patient Account Number: [id number] Date of Birth/Gender: 07/04/1975 (45 y.o. F) Treating RN: Dolan Amen Primary Care Physician: [name] Other Clinician: Referring Physician: [name] Treating Physician/Extender: Skipper Cliche in Treatment: 1 Education Assessment Education Provided To: Patient Education Topics Provided Wound/Skin Impairment: Methods: Explain/Verbal Responses: State content correctly Electronic Signature(s) Signed: 07/27/2020 5:25:46 PM By: Georges Mouse, Minus Breeding RN Entered By: Georges Mouse, Minus Breeding on 07/27/2020 13:05:23 [name] ([id number]) -------------------------------------------------------------------------------- Wound Assessment Details Patient Name: [name] Date of Service: 07/27/2020 12:45 PM Medical Record Number: [id number] Patient Account Number: [id number] Date of Birth/Sex: 1975/11/07 (45 y.o. F) Treating RN: Carlene Coria Primary Care Haven Pylant: [name] Other Clinician: Referring Aeron Lheureux: [name] Treating Mega Kinkade/Extender: Skipper Cliche in Treatment:  1 Wound Status Wound Number: 1 Primary Etiology: 2nd degree Burn Wound Location: Abdomen - midline Wound Status: Open Wounding Event: Trauma Date Acquired: 06/07/2020 Weeks Of Treatment: 1 Clustered Wound: No Photos Wound Measurements Length: (cm) 1 Width: (cm) 3 Depth: (cm) 0.1 Area: (cm) 2.356 Volume: (cm) 0.236 % Reduction in Area:  49.6% % Reduction in Volume: 74.8% Epithelialization: None Tunneling: No Undermining: No Wound Description Classification: Full Thickness Without Exposed Support Structures Exudate Amount: Medium Exudate Type: Serosanguineous Exudate Color: red, brown Foul Odor After Cleansing: No Slough/Fibrino Yes Wound Bed Granulation Amount: Small (1-33%) Exposed Structure Granulation Quality: Pink Fascia Exposed: No Necrotic Amount: Large (67-100%) Fat Layer (Subcutaneous Tissue) Exposed: Yes Necrotic Quality: Adherent Slough Tendon Exposed: No Muscle Exposed: No Joint Exposed: No Bone Exposed: No Treatment Notes Wound #1 (Abdomen - midline) Cleanser Normal Saline Discharge Instruction: Wash your hands with soap and water. Remove old dressing, discard into plastic bag and place into trash. Cleanse the wound with Normal Saline prior to applying a clean dressing using gauze sponges, not tissues or cotton balls. Do not scrub or use excessive force. Pat dry using gauze sponges, not tissue or cotton balls. BUSHRA, DENMAN ([id number]) Peri-Wound Care Topical Primary Dressing Hydrofera Blue Ready Transfer Foam, 2.5x2.5 (in/in) Discharge Instruction: Apply Hydrofera Blue Ready to wound bed as directed Secondary Dressing ABD Pad 5x9 (in/in) Discharge Instruction: Cover with ABD pad Secured With 46M [place] Surgical Tape, 2x2 (in/yd) Compression Wrap Compression Stockings Add-Ons Electronic Signature(s) Signed: 07/31/2020 4:57:18 PM By: Carlene Coria RN Entered By: Carlene Coria on 07/27/2020 12:54:12 [name] ([id number]) -------------------------------------------------------------------------------- Vitals Details Patient Name: [name] Date of Service: 07/27/2020 12:45 PM Medical Record Number: [id number] Patient Account Number: [id number] Date of Birth/Sex: 1976/01/15 (45 y.o. F) Treating RN: Carlene Coria Primary Care Dearius Hoffmann: [name] Other  Clinician: Referring Keymari Sato: [name] Treating Kennidy Lamke/Extender: Skipper Cliche in Treatment: 1 Vital Signs Time Taken: 12:48 Temperature (F): 98.5 Height (in): 67 Pulse (bpm): 97 Weight (lbs): 323 Respiratory Rate (breaths/min): 18 Body Mass Index (BMI): 50.6 Blood Pressure (mmHg): 146/83 Reference Range: 80 - 120 mg / dl Electronic Signature(s) Signed: 07/31/2020 4:57:18 PM By: Carlene Coria RN Entered By: Carlene Coria on 07/27/2020 12:48:49

## 2020-08-04 ENCOUNTER — Encounter: Payer: Commercial Managed Care - PPO | Admitting: Physician Assistant

## 2020-08-04 ENCOUNTER — Other Ambulatory Visit: Payer: Self-pay

## 2020-08-04 DIAGNOSIS — T2122XA Burn of second degree of abdominal wall, initial encounter: Secondary | ICD-10-CM | POA: Diagnosis not present
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
## 2020-08-18 ENCOUNTER — Encounter: Payer: Commercial Managed Care - PPO | Attending: Physician Assistant | Admitting: Physician Assistant

## 2020-08-18 ENCOUNTER — Other Ambulatory Visit: Payer: Self-pay

## 2020-08-18 DIAGNOSIS — L98499 Non-pressure chronic ulcer of skin of other sites with unspecified severity: Secondary | ICD-10-CM | POA: Diagnosis present
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
## 2020-08-21 NOTE — Progress Notes (Addendum)
LYNITA, GROSECLOSE ([id number]) Visit Report for 08/18/2020 Arrival Information Details Patient Name: [name], [name]. Date of Service: 08/18/2020 10:30 AM Medical Record Number: [id number] Patient Account Number: [id number] Date of Birth/Sex: 1976/03/28 (44 y.o. F) Treating RN: Carlene Coria Primary Care Jayquan Bradsher: [name] Other Clinician: Referring Evolette Pendell: [name] Treating Elissia Spiewak/Extender: Skipper Cliche in Treatment: 4 Visit Information History Since Last Visit Added or deleted any medications: No Patient Arrived: Ambulatory Had a fall or experienced change in No Arrival Time: 10:46 activities of daily living that may affect Accompanied By: self risk of falls: Transfer Assistance: None Hospitalized since last visit: No Patient Identification Verified: Yes Pain Present Now: No Secondary Verification Process Completed: Yes Patient Requires Transmission-Based Precautions: No Patient Has Alerts: No Electronic Signature(s) Signed: 08/21/2020 11:47:12 AM By: Jeanine Luz Entered By: Jeanine Luz on 08/18/2020 10:46:45 [name] ([id number]) -------------------------------------------------------------------------------- Clinic Level of Care Assessment Details Patient Name: [name] Date of Service: 08/18/2020 10:30 AM Medical Record Number: [id number] Patient Account Number: [id number] Date of Birth/Sex: 01/27/1976 (44 y.o. F) Treating RN: Carlene Coria Primary Care Jayquon Theiler: [name] Other Clinician: Referring Lenix Benoist: [name] Treating Sharita Bienaime/Extender: Skipper Cliche in Treatment: 4 Clinic Level of Care Assessment Items TOOL 4 Quantity Score X - Use when only an EandM is performed on FOLLOW-UP visit 1 0 ASSESSMENTS - Nursing Assessment / Reassessment X - Reassessment of Co-morbidities (includes updates in patient status) 1 10 X- 1 5 Reassessment of Adherence to Treatment Plan ASSESSMENTS - Wound and Skin  Assessment / Reassessment X - Simple Wound Assessment / Reassessment - one wound 1 5 _0  - 0 Complex Wound Assessment / Reassessment - multiple wounds _1  - 0 Dermatologic / Skin Assessment (not related to wound area) ASSESSMENTS - Focused Assessment _2  - Circumferential Edema Measurements - multi extremities 0 _3  - 0 Nutritional Assessment / Counseling / Intervention _4  - 0 Lower Extremity Assessment (monofilament, tuning fork, pulses) _5  - 0 Peripheral Arterial Disease Assessment (using hand held doppler) ASSESSMENTS - Ostomy and/or Continence Assessment and Care _6  - Incontinence Assessment and Management 0 _7  - 0 Ostomy Care Assessment and Management (repouching, etc.) PROCESS - Coordination of Care X - Simple Patient / Family Education for ongoing care 1 15 _8  - 0 Complex (extensive) Patient / Family Education for ongoing care _9  - 0 Staff obtains Programmer, systems, Records, Test Results / Process Orders _10  - 0 Staff telephones HHA, Nursing Homes / Clarify orders / etc _11  - 0 Routine Transfer to another Facility (non-emergent condition) _12  - 0 Routine Hospital Admission (non-emergent condition) _13  - 0 New Admissions / Biomedical engineer / Ordering NPWT, Apligraf, etc. _14  - 0 Emergency Hospital Admission (emergent condition) X- 1 10 Simple Discharge Coordination _15  - 0 Complex (extensive) Discharge Coordination PROCESS - Special Needs _16  - Pediatric / Minor Patient Management 0 _17  - 0 Isolation Patient Management _18  - 0 Hearing / Language / Visual special needs _19  - 0 Assessment of Community assistance (transportation, D/C planning, etc.) _20  - 0 Additional assistance / Altered mentation _21  - 0 Support Surface(s) Assessment (bed, cushion, seat, etc.) INTERVENTIONS - Wound Cleansing / Measurement [name], [name] ([id number]) X- 1 5 Simple Wound Cleansing - one wound _22  - 0 Complex Wound Cleansing - multiple wounds X- 1 5 Wound Imaging (photographs - any number of  wounds) _23  - 0 Wound Tracing (instead of photographs) X- 1 5 Simple Wound Measurement - one wound _24  - 0 Complex Wound Measurement - multiple wounds INTERVENTIONS - Wound  Dressings X - Small Wound Dressing one or multiple wounds 1 10 _0  - 0 Medium Wound Dressing one or multiple wounds _1  - 0 Large Wound Dressing one or multiple wounds X- 1 5 Application of Medications - topical <YBFXOVANVBTYOMAY>_0<\/KHTXHFSFSELTRVUY>_2  - 0 Application of Medications - injection INTERVENTIONS - Miscellaneous _3  - External ear exam 0 _4  - 0 Specimen Collection (cultures, biopsies, blood, body fluids, etc.) _5  - 0 Specimen(s) / Culture(s) sent or taken to Lab for analysis _6  - 0 Patient Transfer (multiple staff / Harrel Lemon Lift / Similar devices) _7  - 0 Simple Staple / Suture removal (25 or less) _8  - 0 Complex Staple / Suture removal (26 or more) _9  - 0 Hypo / Hyperglycemic Management (close monitor of Blood Glucose) _10  - 0 Ankle / Brachial Index (ABI) - do not check if billed separately X- 1 5 Vital Signs Has the patient been seen at the hospital within the last three years: Yes Total Score: 80 Level Of Care: New/Established - Level 3 Electronic Signature(s) Signed: 08/22/2020 4:27:39 PM By: Carlene Coria RN Entered By: Carlene Coria on 08/18/2020 11:00:42 [name] ([id number]) -------------------------------------------------------------------------------- Encounter Discharge Information Details Patient Name: [name] Date of Service: 08/18/2020 10:30 AM Medical Record Number: [id number] Patient Account Number: [id number] Date of Birth/Sex: 18-May-1976 (44 y.o. F) Treating RN: Carlene Coria Primary Care Ronetta Molla: [name] Other Clinician: Referring Tayla Panozzo: [name] Treating Corneluis Allston/Extender: Skipper Cliche in Treatment: 4 Encounter Discharge Information Items Discharge Condition: Stable Ambulatory Status: Ambulatory Discharge Destination: Home Transportation: Private Auto Accompanied  By: self Schedule Follow-up Appointment: Yes Clinical Summary of Care: Patient Declined Electronic Signature(s) Signed: 08/22/2020 4:27:39 PM By: Carlene Coria RN Entered By: Carlene Coria on 08/18/2020 11:32:40 [name] ([id number]) -------------------------------------------------------------------------------- Lower Extremity Assessment Details Patient Name: [name] Date of Service: 08/18/2020 10:30 AM Medical Record Number: [id number] Patient Account Number: [id number] Date of Birth/Sex: Aug 05, 1975 (44 y.o. F) Treating RN: Carlene Coria Primary Care Jeury Mcnab: [name] Other Clinician: Referring Misheel Gowans: [name] Treating Hollynn Garno/Extender: Skipper Cliche in Treatment: 4 Electronic Signature(s) Signed: 08/21/2020 11:47:12 AM By: Jeanine Luz Signed: 08/22/2020 4:27:39 PM By: Carlene Coria RN Entered By: Jeanine Luz on 08/18/2020 10:51:22 [name] ([id number]) -------------------------------------------------------------------------------- Multi Wound Chart Details Patient Name: [name] Date of Service: 08/18/2020 10:30 AM Medical Record Number: [id number] Patient Account Number: [id number] Date of Birth/Sex: 1976-02-26 (44 y.o. F) Treating RN: Carlene Coria Primary Care Janise Gora: [name] Other Clinician: Referring Valente Fosberg: [name] Treating Kaiyu Mirabal/Extender: Skipper Cliche in Treatment: 4 Vital Signs Height(in): 67 Pulse(bpm): 16 Weight(lbs): 323 Blood Pressure(mmHg): 111/67 Body Mass Index(BMI): 51 Temperature(F): 98.4 Respiratory Rate(breaths/min): 16 Photos: [N/A:N/A] Wound Location: Abdomen - midline N/A N/A Wounding Event: Trauma N/A N/A Primary Etiology: 2nd degree Burn N/A N/A Date Acquired: 06/07/2020 N/A N/A Weeks of Treatment: 4 N/A N/A Wound Status: Open N/A N/A Measurements L x W x D (cm) 0.4x0.5x0.1 N/A N/A Area (cm) : 0.157 N/A N/A Volume (cm) : 0.016 N/A N/A % Reduction  in Area: 96.60% N/A N/A % Reduction in Volume: 98.30% N/A N/A Classification: Full Thickness Without Exposed N/A N/A Support Structures Exudate Amount: Small N/A N/A Exudate Type: Serous N/A N/A Exudate Color: amber N/A N/A Granulation Amount: Large (67-100%) N/A N/A Granulation Quality: Red, Pink N/A N/A Necrotic Amount: None Present (0%) N/A N/A Exposed Structures: Fat Layer (Subcutaneous Tissue): N/A N/A Yes Fascia: No Tendon: No Muscle: No Joint: No Bone: No Epithelialization: Large (67-100%) N/A N/A Treatment Notes Electronic Signature(s) Signed:  08/22/2020 4:27:39 PM By: Carlene Coria RN Entered By: Carlene Coria on 08/18/2020 10:57:25 [name] ([id number]) -------------------------------------------------------------------------------- [place] Details Patient Name: [name], [name] Date of Service: 08/18/2020 10:30 AM Medical Record Number: [id number] Patient Account Number: [id number] Date of Birth/Sex: [birth year] (44 y.o. F) Treating RN: Carlene Coria Primary Care Heberto Sturdevant: [name] Other Clinician: Referring Ferrell Flam: [name] Treating Jamonta Goerner/Extender: Skipper Cliche in Treatment: 4 Active Inactive Wound/Skin Impairment Nursing Diagnoses: Impaired tissue integrity Goals: Patient/caregiver will verbalize understanding of skin care regimen Date Initiated: 07/20/2020 Date Inactivated: 07/27/2020 Target Resolution Date: 07/20/2020 Goal Status: Met Ulcer/skin breakdown will have a volume reduction of 30% by week 4 Date Initiated: 07/20/2020 Date Inactivated: 08/18/2020 Target Resolution Date: 08/17/2020 Goal Status: Met Ulcer/skin breakdown will have a volume reduction of 50% by week 8 Date Initiated: 07/20/2020 Target Resolution Date: 09/17/2020 Goal Status: Active Interventions: Assess patient/caregiver ability to obtain necessary supplies Assess patient/caregiver ability to perform ulcer/skin care regimen upon  admission and as needed Assess ulceration(s) every visit Provide education on ulcer and skin care Treatment Activities: Skin care regimen initiated : 07/20/2020 Topical wound management initiated : 07/20/2020 Notes: Electronic Signature(s) Signed: 08/22/2020 4:27:39 PM By: Carlene Coria RN Entered By: Carlene Coria on 08/18/2020 10:57:08 [name] ([id number]) -------------------------------------------------------------------------------- Pain Assessment Details Patient Name: [name] Date of Service: 08/18/2020 10:30 AM Medical Record Number: [id number] Patient Account Number: [id number] Date of Birth/Sex: 04-18-1976 (44 y.o. F) Treating RN: Carlene Coria Primary Care Aviyana Sonntag: [name] Other Clinician: Referring Aara Jacquot: [name] Treating Saralynn Langhorst/Extender: Skipper Cliche in Treatment: 4 Active Problems Location of Pain Severity and Description of Pain Patient Has Paino No Site Locations Pain Management and Medication Current Pain Management: Electronic Signature(s) Signed: 08/21/2020 11:47:12 AM By: Jeanine Luz Signed: 08/22/2020 4:27:39 PM By: Carlene Coria RN Entered By: Jeanine Luz on 08/18/2020 10:47:16 [name] ([id number]) -------------------------------------------------------------------------------- Patient/Caregiver Education Details Patient Name: [name] Date of Service: 08/18/2020 10:30 AM Medical Record Number: [id number] Patient Account Number: [id number] Date of Birth/Gender: 10/10/1975 (45 y.o. F) Treating RN: Carlene Coria Primary Care Physician: [name] Other Clinician: Referring Physician: [name] Treating Physician/Extender: Skipper Cliche in Treatment: 4 Education Assessment Education Provided To: Patient Education Topics Provided Wound/Skin Impairment: Methods: Explain/Verbal Responses: State content correctly Electronic Signature(s) Signed: 08/22/2020 4:27:39 PM By: Carlene Coria RN Entered By: Carlene Coria on 08/18/2020 11:01:04 [name] ([id number]) -------------------------------------------------------------------------------- Wound Assessment Details Patient Name: [name] Date of Service: 08/18/2020 10:30 AM Medical Record Number: [id number] Patient Account Number: [id number] Date of Birth/Sex: [birth year] (44 y.o. F) Treating RN: Carlene Coria Primary Care Brent Taillon: [name] Other Clinician: Referring Navi Ewton: [name] Treating Tywone Bembenek/Extender: Skipper Cliche in Treatment: 4 Wound Status Wound Number: 1 Primary Etiology: 2nd degree Burn Wound Location: Abdomen - midline Wound Status: Open Wounding Event: Trauma Date Acquired: 06/07/2020 Weeks Of Treatment: 4 Clustered Wound: No Photos Wound Measurements Length: (cm) 0.4 Width: (cm) 0.5 Depth: (cm) 0.1 Area: (cm) 0.157 Volume: (cm) 0.016 % Reduction in Area: 96.6% % Reduction in Volume: 98.3% Epithelialization: Large (67-100%) Wound Description Classification: Full Thickness Without Exposed Support Structures Exudate Amount: Small Exudate Type: Serous Exudate Color: amber Foul Odor After Cleansing: No Slough/Fibrino No Wound Bed Granulation Amount: Large (67-100%) Exposed Structure Granulation Quality: Red, Pink Fascia Exposed: No Necrotic Amount: None Present (0%) Fat Layer (Subcutaneous Tissue) Exposed: Yes Tendon Exposed: No Muscle Exposed: No Joint Exposed: No Bone Exposed: No Treatment Notes Wound #1 (Abdomen - midline)  Cleanser Normal Saline Discharge Instruction: Wash your hands with soap and water. Remove old dressing, discard into plastic bag and place into trash. Cleanse the wound with Normal Saline prior to applying a clean dressing using gauze sponges, not tissues or cotton balls. Do not scrub or use excessive force. Pat dry using gauze sponges, not tissue or cotton balls. [name], [name] ([id number]) Peri-Wound  Care Topical Primary Dressing Hydrofera Blue Ready Transfer Foam, 2.5x2.5 (in/in) Discharge Instruction: Apply Hydrofera Blue Ready to wound bed as directed Secondary Dressing Secured With Tegaderm Film Transparent 4x4.75 (in/in) Discharge Instruction: Apply to wound bed Compression Wrap Compression Stockings Add-Ons Electronic Signature(s) Signed: 08/21/2020 11:47:12 AM By: Jeanine Luz Signed: 08/22/2020 4:27:39 PM By: Carlene Coria RN Entered By: Jeanine Luz on 08/18/2020 10:50:40 [name] ([id number]) -------------------------------------------------------------------------------- Vitals Details Patient Name: [name] Date of Service: 08/18/2020 10:30 AM Medical Record Number: [id number] Patient Account Number: [id number] Date of Birth/Sex: 11-24-1975 (44 y.o. F) Treating RN: Carlene Coria Primary Care Kasidee Voisin: [name] Other Clinician: Referring Korde Jeppsen: [name] Treating Kayson Tasker/Extender: Skipper Cliche in Treatment: 4 Vital Signs Time Taken: 10:43 Temperature (F): 98.4 Height (in): 67 Pulse (bpm): 87 Weight (lbs): 323 Respiratory Rate (breaths/min): 16 Body Mass Index (BMI): 50.6 Blood Pressure (mmHg): 111/67 Reference Range: 80 - 120 mg / dl Electronic Signature(s) Signed: 08/21/2020 11:47:12 AM By: Jeanine Luz Entered By: Jeanine Luz on 08/18/2020 10:47:11

## 2020-09-01 ENCOUNTER — Ambulatory Visit: Payer: Commercial Managed Care - PPO | Admitting: Physician Assistant

## 2020-09-08 ENCOUNTER — Encounter: Payer: Commercial Managed Care - PPO | Attending: Physician Assistant | Admitting: Physician Assistant

## 2020-09-08 ENCOUNTER — Other Ambulatory Visit: Payer: Self-pay

## 2020-09-08 DIAGNOSIS — Z09 Encounter for follow-up examination after completed treatment for conditions other than malignant neoplasm: Secondary | ICD-10-CM | POA: Diagnosis not present

## 2020-09-08 DIAGNOSIS — T2122XA Burn of second degree of abdominal wall, initial encounter: Secondary | ICD-10-CM | POA: Diagnosis present

## 2020-09-08 NOTE — Progress Notes (Addendum)
[name], [name] ([id number]) Visit Report for 09/08/2020 Arrival Information Details Patient Name: [name], [name]. Date of Service: 09/08/2020 2:45 PM Medical Record Number: [id number] Patient Account Number: [id number] Date of Birth/Sex: February 08, 1976 (45 y.o. F) Treating RN: Carlene Coria Primary Care Zhuri Krass: [name] Other Clinician: Jeanine Luz Referring Essam Lowdermilk: [name] Treating Paysley Poplar/Extender: Skipper Cliche in Treatment: 7 Visit Information History Since Last Visit Added or deleted any medications: No Patient Arrived: Ambulatory Had a fall or experienced change in No Arrival Time: 14:58 activities of daily living that may affect Accompanied By: self risk of falls: Transfer Assistance: None Hospitalized since last visit: No Patient Identification Verified: Yes Pain Present Now: No Secondary Verification Process Completed: Yes Patient Requires Transmission-Based Precautions: No Patient Has Alerts: No Electronic Signature(s) Signed: 09/11/2020 4:17:01 PM By: Jeanine Luz Entered By: Jeanine Luz on 09/08/2020 14:58:15 [name] ([id number]) -------------------------------------------------------------------------------- Clinic Level of Care Assessment Details Patient Name: [name] Date of Service: 09/08/2020 2:45 PM Medical Record Number: [id number] Patient Account Number: [id number] Date of Birth/Sex: 01/02/1976 (45 y.o. F) Treating RN: Carlene Coria Primary Care Markeya Mincy: [name] Other Clinician: Jeanine Luz Referring Chryl Holten: [name] Treating Jorey Dollard/Extender: Skipper Cliche in Treatment: 7 Clinic Level of Care Assessment Items TOOL 4 Quantity Score X - Use when only an EandM is performed on FOLLOW-UP visit 1 0 ASSESSMENTS - Nursing Assessment / Reassessment X - Reassessment of Co-morbidities (includes updates in patient status) 1 10 X- 1 5 Reassessment of Adherence to Treatment  Plan ASSESSMENTS - Wound and Skin Assessment / Reassessment X - Simple Wound Assessment / Reassessment - one wound 1 5 []  - 0 Complex Wound Assessment / Reassessment - multiple wounds []  - 0 Dermatologic / Skin Assessment (not related to wound area) ASSESSMENTS - Focused Assessment []  - Circumferential Edema Measurements - multi extremities 0 []  - 0 Nutritional Assessment / Counseling / Intervention []  - 0 Lower Extremity Assessment (monofilament, tuning fork, pulses) []  - 0 Peripheral Arterial Disease Assessment (using hand held doppler) ASSESSMENTS - Ostomy and/or Continence Assessment and Care []  - Incontinence Assessment and Management 0 []  - 0 Ostomy Care Assessment and Management (repouching, etc.) PROCESS - Coordination of Care X - Simple Patient / Family Education for ongoing care 1 15 []  - 0 Complex (extensive) Patient / Family Education for ongoing care []  - 0 Staff obtains Programmer, systems, Records, Test Results / Process Orders []  - 0 Staff telephones HHA, Nursing Homes / Clarify orders / etc []  - 0 Routine Transfer to another Facility (non-emergent condition) []  - 0 Routine Hospital Admission (non-emergent condition) []  - 0 New Admissions / Biomedical engineer / Ordering NPWT, Apligraf, etc. []  - 0 Emergency Hospital Admission (emergent condition) X- 1 10 Simple Discharge Coordination []  - 0 Complex (extensive) Discharge Coordination PROCESS - Special Needs []  - Pediatric / Minor Patient Management 0 []  - 0 Isolation Patient Management []  - 0 Hearing / Language / Visual special needs []  - 0 Assessment of Community assistance (transportation, D/C planning, etc.) []  - 0 Additional assistance / Altered mentation []  - 0 Support Surface(s) Assessment (bed, cushion, seat, etc.) INTERVENTIONS - Wound Cleansing / Measurement Schlechter, Debbe N. ([id number]) X- 1 5 Simple Wound Cleansing - one wound []  - 0 Complex Wound Cleansing - multiple wounds X- 1 5 Wound  Imaging (photographs - any number of wounds) []  - 0 Wound Tracing (instead of photographs) X- 1 5 Simple Wound Measurement - one wound []  - 0 Complex Wound Measurement - multiple  wounds INTERVENTIONS - Wound Dressings []  - Small Wound Dressing one or multiple wounds 0 []  - 0 Medium Wound Dressing one or multiple wounds []  - 0 Large Wound Dressing one or multiple wounds X- 1 5 Application of Medications - topical []  - 0 Application of Medications - injection INTERVENTIONS - Miscellaneous []  - External ear exam 0 []  - 0 Specimen Collection (cultures, biopsies, blood, body fluids, etc.) []  - 0 Specimen(s) / Culture(s) sent or taken to Lab for analysis []  - 0 Patient Transfer (multiple staff / Harrel Lemon Lift / Similar devices) []  - 0 Simple Staple / Suture removal (25 or less) []  - 0 Complex Staple / Suture removal (26 or more) []  - 0 Hypo / Hyperglycemic Management (close monitor of Blood Glucose) []  - 0 Ankle / Brachial Index (ABI) - do not check if billed separately X- 1 5 Vital Signs Has the patient been seen at the hospital within the last three years: Yes Total Score: 70 Level Of Care: New/Established - Level 2 Electronic Signature(s) Signed: 09/15/2020 8:11:40 AM By: Carlene Coria RN Entered By: Carlene Coria on 09/08/2020 15:38:15 [name] ([id number]) -------------------------------------------------------------------------------- Encounter Discharge Information Details Patient Name: [name] Date of Service: 09/08/2020 2:45 PM Medical Record Number: [id number] Patient Account Number: [id number] Date of Birth/Sex: [birth year] (45 y.o. F) Treating RN: Carlene Coria Primary Care Patryck Kilgore: [name] Other Clinician: Jeanine Luz Referring Bryssa Tones: [name] Treating Aamiyah Derrick/Extender: Skipper Cliche in Treatment: 7 Encounter Discharge Information Items Discharge Condition: Stable Ambulatory Status: Ambulatory Discharge Destination:  Home Transportation: Private Auto Accompanied By: self Schedule Follow-up Appointment: Yes Clinical Summary of Care: Patient Declined Electronic Signature(s) Signed: 09/15/2020 8:11:40 AM By: Carlene Coria RN Entered By: Carlene Coria on 09/08/2020 15:39:36 [name] ([id number]) -------------------------------------------------------------------------------- Lower Extremity Assessment Details Patient Name: [name] Date of Service: 09/08/2020 2:45 PM Medical Record Number: [id number] Patient Account Number: [id number] Date of Birth/Sex: 1976-04-09 (45 y.o. F) Treating RN: Carlene Coria Primary Care Lebron Nauert: [name] Other Clinician: Jeanine Luz Referring Eldra Word: [name] Treating Jenavee Laguardia/Extender: Skipper Cliche in Treatment: 7 Electronic Signature(s) Signed: 09/11/2020 4:17:01 PM By: Jeanine Luz Signed: 09/15/2020 8:11:40 AM By: Carlene Coria RN Entered By: Jeanine Luz on 09/08/2020 15:00:18 [name] ([id number]) -------------------------------------------------------------------------------- Multi Wound Chart Details Patient Name: [name] Date of Service: 09/08/2020 2:45 PM Medical Record Number: [id number] Patient Account Number: [id number] Date of Birth/Sex: [birth year] (45 y.o. F) Treating RN: Carlene Coria Primary Care Jaonna Word: [name] Other Clinician: Jeanine Luz Referring Alexzandria Massman: [name] Treating Janika Jedlicka/Extender: Skipper Cliche in Treatment: 7 Vital Signs Height(in): 67 Pulse(bpm): 105 Weight(lbs): 323 Blood Pressure(mmHg): 136/75 Body Mass Index(BMI): 51 Temperature(F): 98.4 Respiratory Rate(breaths/min): 20 Photos: [N/A:N/A] Wound Location: Abdomen - midline N/A N/A Wounding Event: Trauma N/A N/A Primary Etiology: 2nd degree Burn N/A N/A Date Acquired: 06/07/2020 N/A N/A Weeks of Treatment: 7 N/A N/A Wound Status: Open N/A N/A Measurements L x W x D (cm) 0.1x0.1x0.1 N/A  N/A Area (cm) : 0.008 N/A N/A Volume (cm) : 0.001 N/A N/A % Reduction in Area: 99.80% N/A N/A % Reduction in Volume: 99.90% N/A N/A Classification: Full Thickness Without Exposed N/A N/A Support Structures Exudate Amount: None Present N/A N/A Granulation Amount: None Present (0%) N/A N/A Necrotic Amount: None Present (0%) N/A N/A Exposed Structures: Fascia: No N/A N/A Fat Layer (Subcutaneous Tissue): No Tendon: No Muscle: No Joint: No Bone: No Epithelialization: None N/A N/A Treatment Notes Electronic Signature(s) Signed: 09/15/2020 8:11:40 AM By: Carlene Coria  RN Entered By: Carlene Coria on 09/08/2020 15:36:21 [name] ([id number]) -------------------------------------------------------------------------------- [place] Details Patient Name: [name], [name]. Date of Service: 09/08/2020 2:45 PM Medical Record Number: [id number] Patient Account Number: [id number] Date of Birth/Sex: May 22, 1976 (45 y.o. F) Treating RN: Carlene Coria Primary Care Austen Oyster: [name] Other Clinician: Jeanine Luz Referring Kathaleen Dudziak: [name] Treating Dyshaun Bonzo/Extender: Skipper Cliche in Treatment: 7 Active Inactive Electronic Signature(s) Signed: 09/15/2020 8:11:40 AM By: Carlene Coria RN Entered By: Carlene Coria on 09/08/2020 15:39:02 [name] ([id number]) -------------------------------------------------------------------------------- Pain Assessment Details Patient Name: [name] Date of Service: 09/08/2020 2:45 PM Medical Record Number: [id number] Patient Account Number: [id number] Date of Birth/Sex: 05/25/1976 (45 y.o. F) Treating RN: Carlene Coria Primary Care Shanina Kepple: [name] Other Clinician: Jeanine Luz Referring Jazmynn Pho: [name] Treating Shacarra Choe/Extender: Skipper Cliche in Treatment: 7 Active Problems Location of Pain Severity and Description of Pain Patient Has Paino No Site Locations Rate  the pain. Current Pain Level: 0 Pain Management and Medication Current Pain Management: Electronic Signature(s) Signed: 09/11/2020 4:17:01 PM By: Jeanine Luz Signed: 09/15/2020 8:11:40 AM By: Carlene Coria RN Entered By: Jeanine Luz on 09/08/2020 15:00:10 [name] ([id number]) -------------------------------------------------------------------------------- Patient/Caregiver Education Details Patient Name: [name] Date of Service: 09/08/2020 2:45 PM Medical Record Number: [id number] Patient Account Number: [id number] Date of Birth/Gender: 01-14-1976 (45 y.o. F) Treating RN: Carlene Coria Primary Care Physician: [name] Other Clinician: Jeanine Luz Referring Physician: [name] Treating Physician/Extender: Skipper Cliche in Treatment: 7 Education Assessment Education Provided To: Patient Education Topics Provided Wound/Skin Impairment: Methods: Explain/Verbal Responses: State content correctly Electronic Signature(s) Signed: 09/15/2020 8:11:40 AM By: Carlene Coria RN Entered By: Carlene Coria on 09/08/2020 15:38:31 [name] ([id number]) -------------------------------------------------------------------------------- Wound Assessment Details Patient Name: [name] Date of Service: 09/08/2020 2:45 PM Medical Record Number: [id number] Patient Account Number: [id number] Date of Birth/Sex: 1976/03/27 (45 y.o. F) Treating RN: Carlene Coria Primary Care Abdullah Rizzi: [name] Other Clinician: Jeanine Luz Referring Kennett Symes: [name] Treating Kwynn Schlotter/Extender: Skipper Cliche in Treatment: 7 Wound Status Wound Number: 1 Primary Etiology: 2nd degree Burn Wound Location: Abdomen - midline Wound Status: Open Wounding Event: Trauma Date Acquired: 06/07/2020 Weeks Of Treatment: 7 Clustered Wound: No Photos Wound Measurements Length: (cm) 0.1 Width: (cm) 0.1 Depth: (cm) 0.1 Area: (cm) 0.008 Volume: (cm)  0.001 % Reduction in Area: 99.8% % Reduction in Volume: 99.9% Epithelialization: None Tunneling: No Undermining: No Wound Description Classification: Full Thickness Without Exposed Support Structure Exudate Amount: None Present s Foul Odor After Cleansing: No Slough/Fibrino No Wound Bed Granulation Amount: None Present (0%) Exposed Structure Necrotic Amount: None Present (0%) Fascia Exposed: No Fat Layer (Subcutaneous Tissue) Exposed: No Tendon Exposed: No Muscle Exposed: No Joint Exposed: No Bone Exposed: No Treatment Notes Wound #1 (Abdomen - midline) Cleanser Peri-Wound Care Topical Primary Dressing [name], [name] ([id number]) Secondary Dressing Secured With Compression Wrap Compression Stockings Add-Ons Electronic Signature(s) Signed: 09/11/2020 4:17:01 PM By: Jeanine Luz Signed: 09/15/2020 8:11:40 AM By: Carlene Coria RN Entered By: Jeanine Luz on 09/08/2020 15:02:43 [name] ([id number]) -------------------------------------------------------------------------------- Vitals Details Patient Name: [name] Date of Service: 09/08/2020 2:45 PM Medical Record Number: [id number] Patient Account Number: [id number] Date of Birth/Sex: March 07, 1976 (46 y.o. F) Treating RN: Carlene Coria Primary Care Hezikiah Retzloff: [name] Other Clinician: Jeanine Luz Referring Justyce Yeater: [name] Treating Jaylynn Mcaleer/Extender: Skipper Cliche in Treatment: 7 Vital Signs Time Taken: 15:59 Temperature (F): 98.4 Height (in): 67 Pulse (bpm): 105 Weight (lbs): 323  Respiratory Rate (breaths/min): 20 Body Mass Index (BMI): 50.6 Blood Pressure (mmHg): 136/75 Reference Range: 80 - 120 mg / dl Electronic Signature(s) Signed: 09/11/2020 4:17:01 PM By: Jeanine Luz Entered By: Jeanine Luz on 09/08/2020 15:00:03
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
## 2020-10-08 IMAGING — US US PELVIS COMPLETE TRANSABD/TRANSVAG
1 series · 15 of 25 positions shown · non-contrast
Comparison: CT 06/02/2017

CLINICAL DATA: Amenorrhea.  Right ovarian cyst



[Series 1: us pelvis complete transabd/transvag · 15 of 47 slices shown]
[im 1/47]
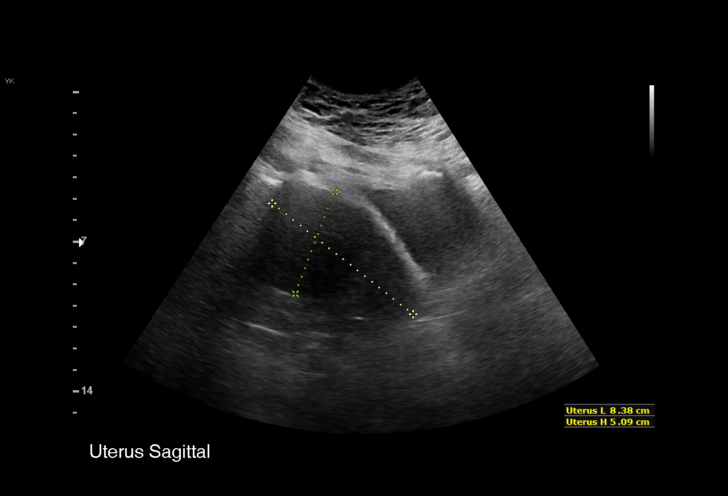
[im 4/47]
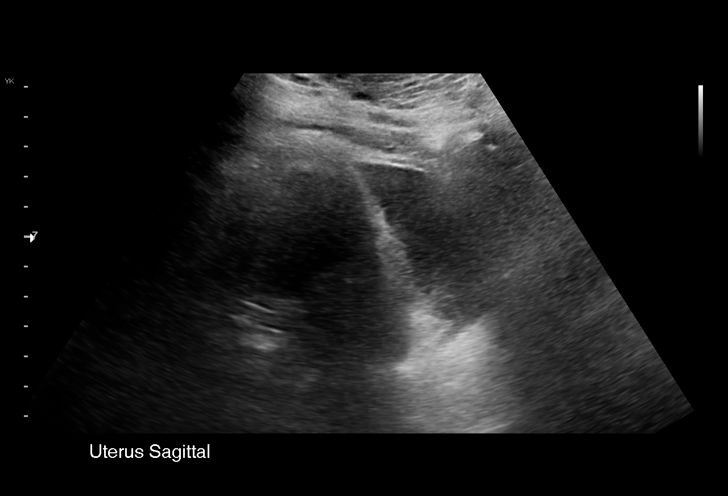
[im 8/47]
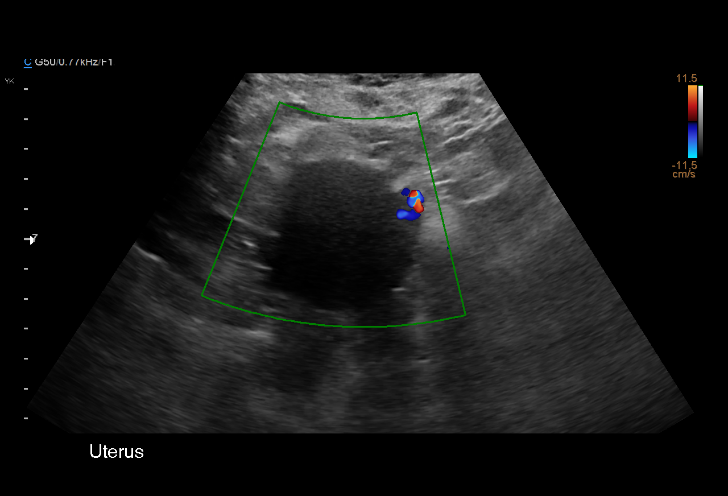
[im 10/47]
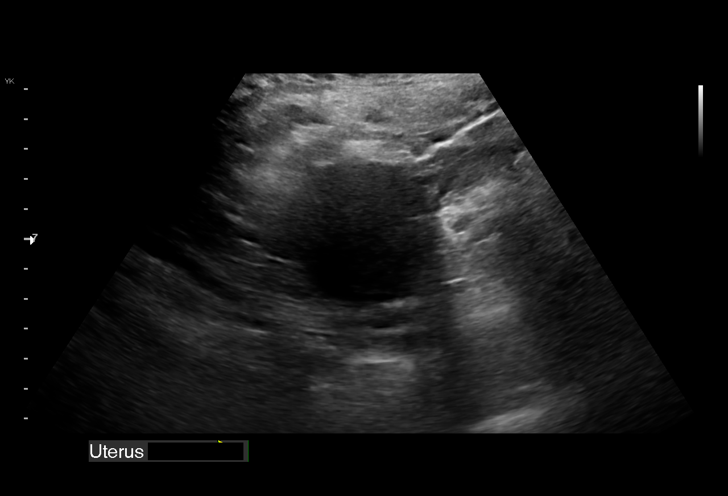
[im 14/47]
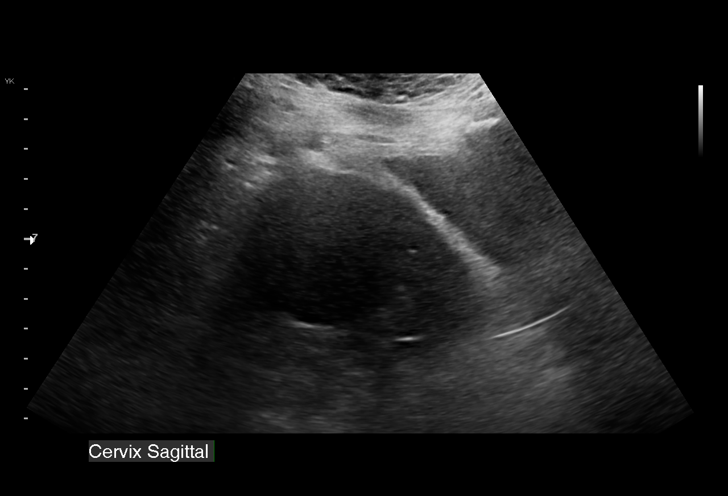
[im 18/47]
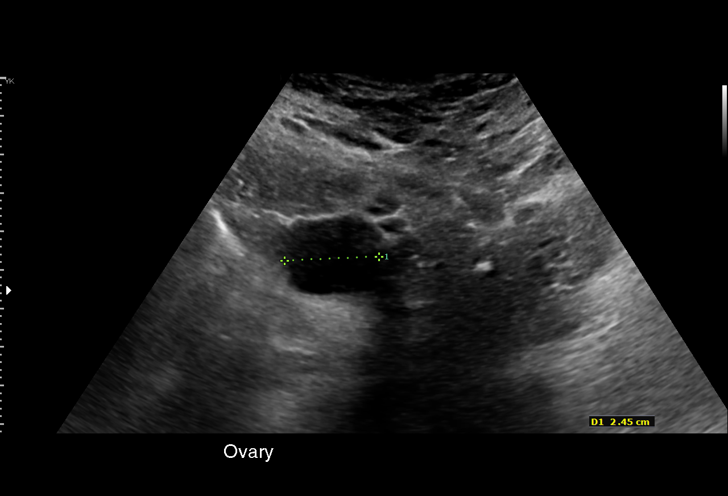
[im 20/47]
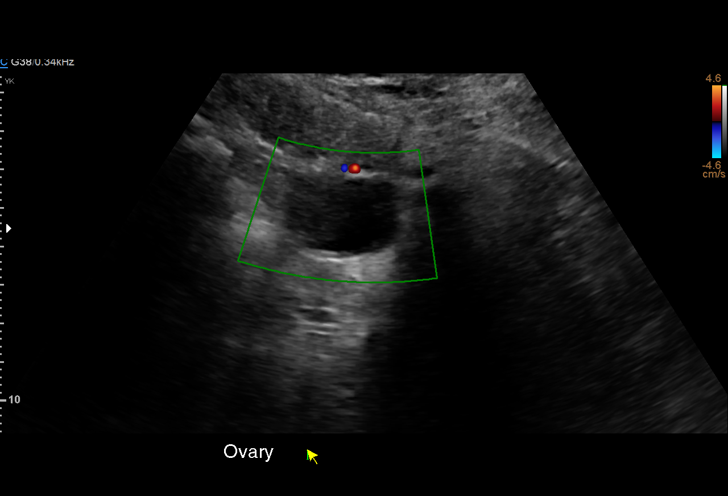
[im 24/47]
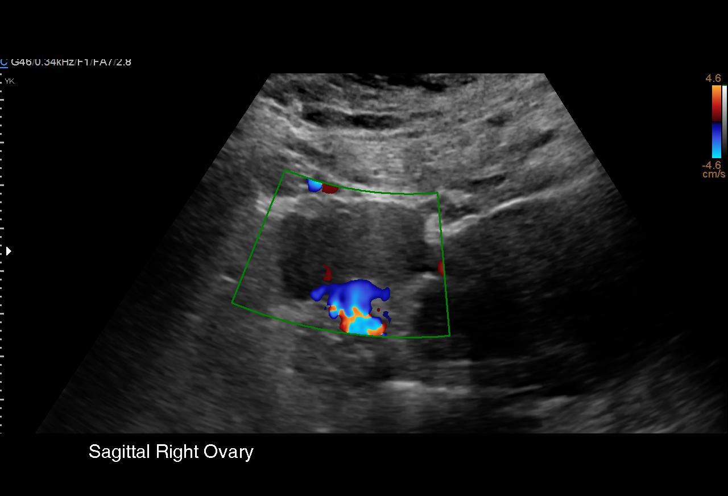
[im 27/47]
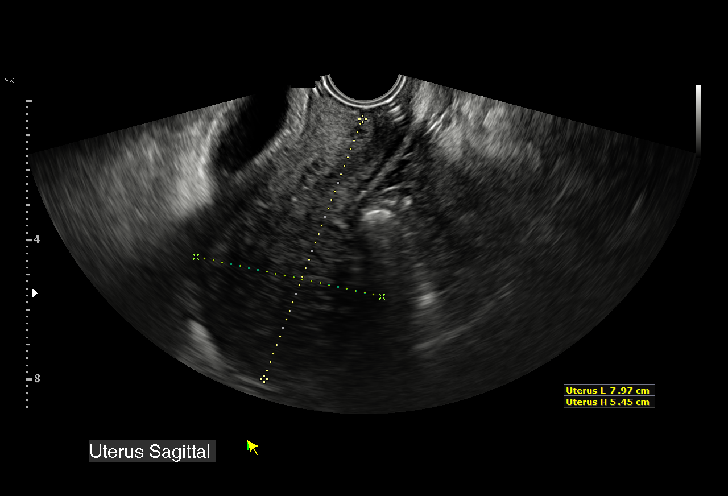
[im 29/47]
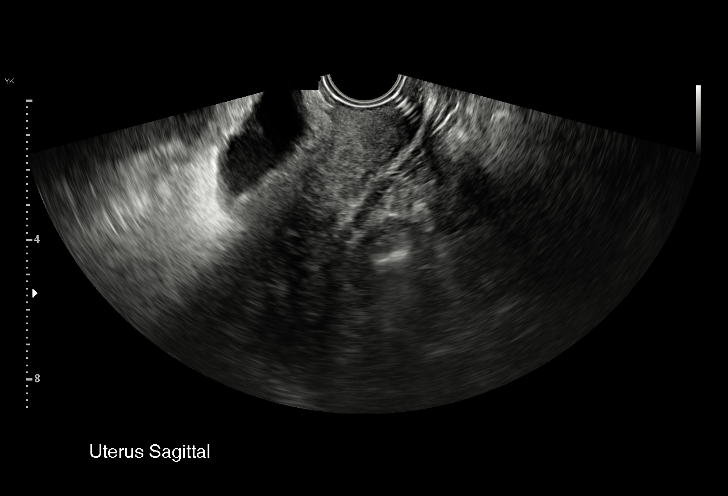
[im 33/47]
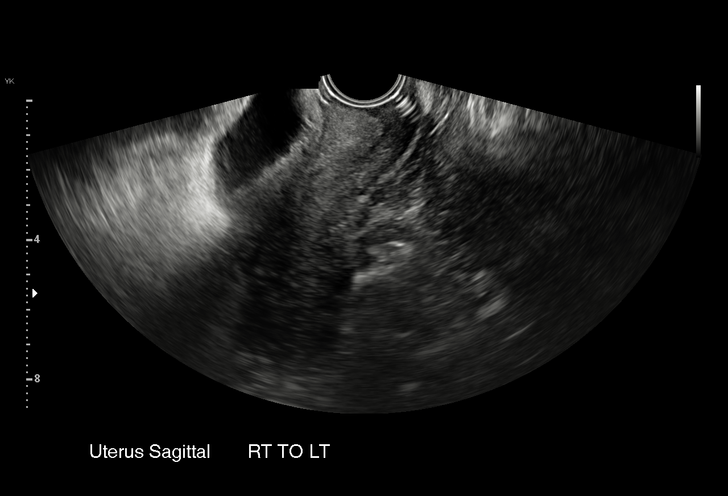
[im 37/47]
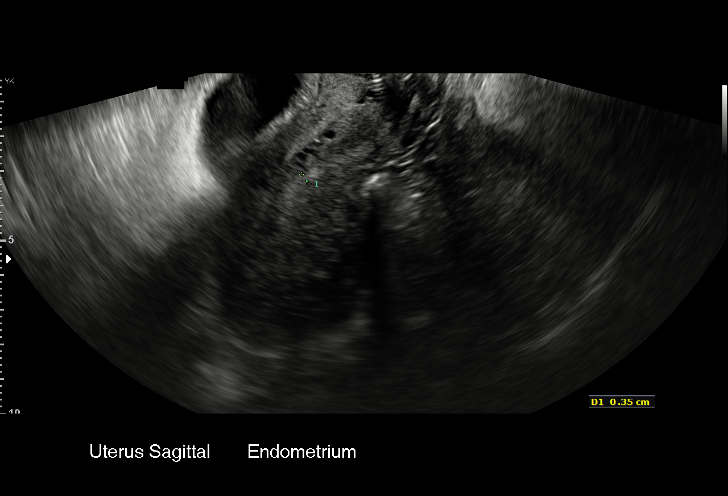
[im 39/47]
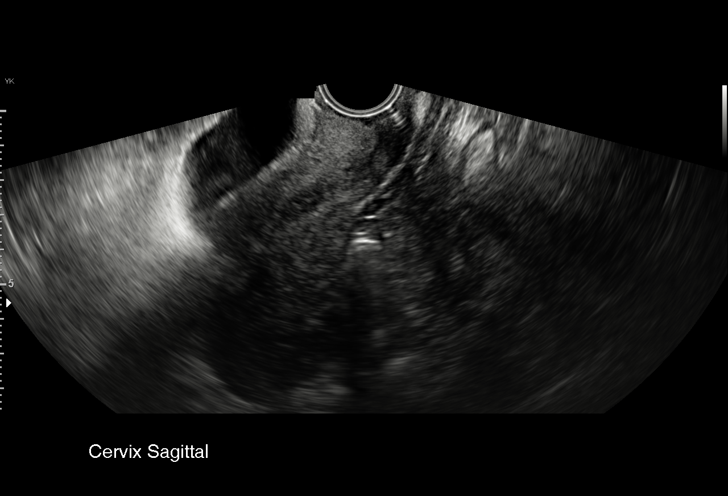
[im 43/47]
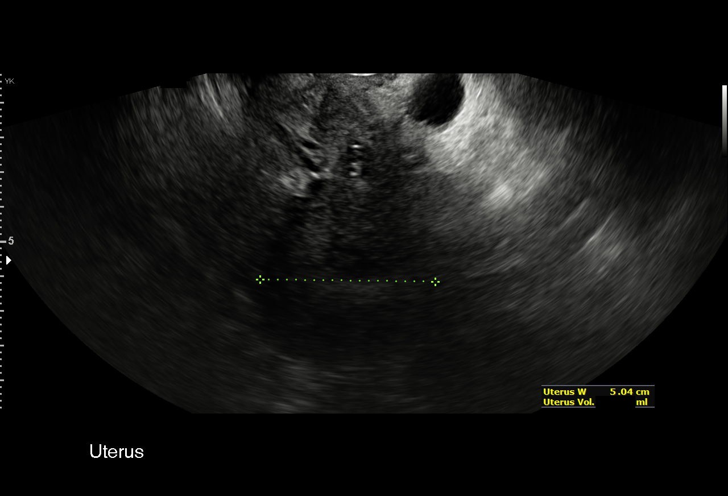
[im 47/47]
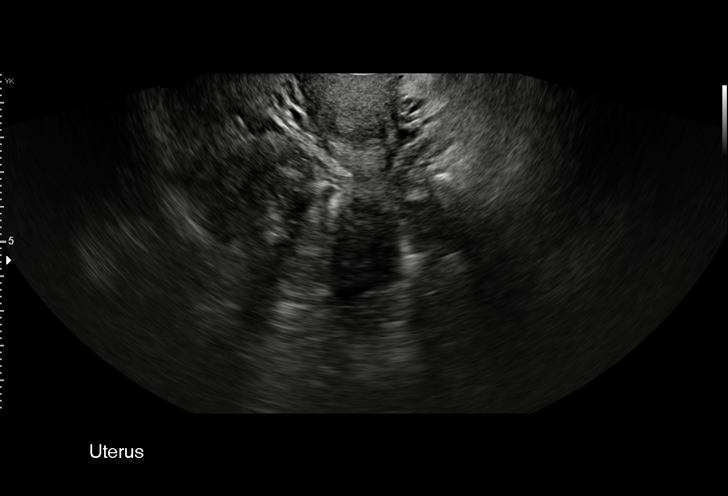

[15 of 25 positions shown; findings below may reference images not displayed]

FINDINGS: Uterus

Measurements: 8.0 x 5.5 x 5.0 cm = volume: 115 mL. No fibroids or
other mass visualized.

Endometrium

Thickness: 6 mm in thickness.  No focal abnormality visualized.

Right ovary

Measurements: 3.7 x 2.2 x 2.9 cm = volume: 12.2 mL. Normal
appearance/no adnexal mass.

Left ovary

Measurements: 4.5 x 2.5 x 4.0 cm = volume: 23.9 mL. 2.5 cm simple
cyst or dominant follicle. No adnexal mass.

Other findings

No abnormal free fluid.
IMPRESSION: No acute or significant abnormality.

## 2020-10-10 ENCOUNTER — Ambulatory Visit (INDEPENDENT_AMBULATORY_CARE_PROVIDER_SITE_OTHER): Payer: Commercial Managed Care - PPO | Admitting: Primary Care

## 2020-11-22 ENCOUNTER — Other Ambulatory Visit: Payer: Self-pay | Admitting: Gastroenterology

## 2020-11-27 ENCOUNTER — Telehealth: Payer: Commercial Managed Care - PPO | Admitting: Physician Assistant

## 2020-11-27 DIAGNOSIS — F331 Major depressive disorder, recurrent, moderate: Secondary | ICD-10-CM
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
## 2020-11-27 NOTE — Patient Instructions (Signed)
  [name], thank you for joining Pamela Rio, PA-C for today's virtual visit.  While this provider is not your primary care provider (PCP), if your PCP is located in our provider database this encounter information will be shared with them immediately following your visit.  Consent: (Patient) [name] provided verbal consent for this virtual visit at the beginning of the encounter.  Current Medications: No current outpatient medications on file.   Medications ordered in this encounter:  No orders of the defined types were placed in this encounter.    *If you need refills on other medications prior to your next appointment, please contact your pharmacy*  Follow-Up: Call back or seek an in-person evaluation if the symptoms worsen or if the condition fails to improve as anticipated.  Other Instructions [name] : [phone]  - Call today to let them know what you have been experiencing and they can help get you set up for evaluation and ongoing management.   Continue counseling with your EAP for now. I have listed some local resources below if you are wanting to switch" - [facility] -- [phone]   If you or a family member do not have a primary care provider, use the link below to schedule a visit and establish care. When you choose a [facility] primary care physician or advanced practice provider, you gain a long-term partner in health. Find a Primary Care Provider  If you have any thoughts of harming yourself or others, call 911 or be seen at nearest ER for assessment. Please do not delay care!  Learn more about [facility] in-office and virtual care options: [facility]

## 2020-11-28 ENCOUNTER — Other Ambulatory Visit: Payer: Self-pay

## 2020-11-28 ENCOUNTER — Telehealth (HOSPITAL_COMMUNITY): Payer: Self-pay | Admitting: Licensed Clinical Social Worker

## 2020-11-28 ENCOUNTER — Encounter (HOSPITAL_COMMUNITY): Payer: Self-pay | Admitting: Family

## 2020-11-28 ENCOUNTER — Ambulatory Visit (HOSPITAL_COMMUNITY)
Admission: EM | Admit: 2020-11-28 | Discharge: 2020-11-28 | Disposition: A | Discharge status: Home / Self Care | Payer: Commercial Managed Care - PPO

## 2020-11-28 DIAGNOSIS — F332 Major depressive disorder, recurrent severe without psychotic features: Secondary | ICD-10-CM

## 2020-11-28 NOTE — Discharge Instructions (Addendum)
Take all medications as prescribed. Keep all follow-up appointments as scheduled.  Do not consume alcohol or use illegal drugs while on prescription medications. Report any adverse effects from your medications to your primary care provider promptly.  In the event of recurrent symptoms or worsening symptoms, call 911, a crisis hotline, or go to the nearest emergency department for evaluation.   

## 2020-11-28 NOTE — ED Provider Notes (Addendum)
Behavioral Health Urgent Care Medical Screening Exam  Patient Name: [name] MRN: [id number] Date of Evaluation: 11/28/20 Chief Complaint:   Diagnosis:  Final diagnoses:  Severe episode of recurrent major depressive disorder, without psychotic features ([place])    History of Present illness: [name] is a 46 y.o. female presents to [facility] urgent care seeking additional outpatient resources due to worsening depression and passive suicidal ideations.  [name] reports her main stressors is related to grief and loss of her father 10 years ago.  She reports the past Father's Day holiday brought back a lot of memories.  States she has 5 children. States that the 6 year old and 47 year old no longer reside in the home.  States 28 and 47 year old have been out of control and oppositional.  Stated her 46 year old daughter has been running away and taking her vehicle without permission.   States she has been disrespectful and defiant which is causing 47 year old to emulate similar behaviors.  [name] reports feeling overwhelmed states she does not feel like living any longer. She denied plan or intent. Stated she has removed all firearms from her home.  She denied previous suicide attempts.  Denied previous inpatient admissions.  Denies that she is followed by therapy or psychiatry currently.  Reported she was during virtual visit with EAP, however stated that this is not helpful thus fair. She states that she is not eating and sleeping enough. Reported racing thoughts,  poor concentration, depression, tearfulness and loneliness.    Reported protective factors are her children. "  I do not want them to experience what is like to not have parents."  Discussed starting partial hospitalization programming and possibly medication for mood stabilization.  Patient was receptive to plan.  Support,encouragement and reassurance was provided.  Psychiatric Specialty Exam  Presentation  General  Appearance:Appropriate for Environment  Eye Contact:Good  Speech:Clear and Coherent  Speech Volume:Normal  Handedness:Right   Mood and Affect  Mood:Anxious; Depressed  Affect:Congruent   Thought Process  Thought Processes:Coherent  Descriptions of Associations:Intact  Orientation:Full (Time, Place and Person)  Thought Content:Logical    Hallucinations:None  Ideas of Reference:None  Suicidal Thoughts:Yes, Passive Without Intent; Without Plan  Homicidal Thoughts:No   Sensorium  Memory:Recent Fair; Immediate Good; Recent Good; Remote Good  Judgment:Good  Insight:Good   Executive Functions  Concentration:Good  Attention Span:Good  Recall:Good  Fund of Knowledge:Good  Language:Good   Psychomotor Activity  Psychomotor Activity:Normal   Assets  Assets:Social Support; Desire for Improvement   Sleep  Sleep:Fair  Number of hours:  No data recorded  Nutritional Assessment (For OBS and FBC admissions only) Has the patient had a weight loss or gain of 10 pounds or more in the last 3 months?: No Has the patient had a decrease in food intake/or appetite?: No Does the patient have dental problems?: No Does the patient have eating habits or behaviors that may be indicators of an eating disorder including binging or inducing vomiting?: No Has the patient recently lost weight without trying?: No Has the patient been eating poorly because of a decreased appetite?: No Malnutrition Screening Tool Score: 0   Physical Exam: Physical Exam Cardiovascular:     Rate and Rhythm: Normal rate and regular rhythm.     Pulses: Normal pulses.  Pulmonary:     Effort: Pulmonary effort is normal.  Neurological:     Mental Status: She is alert.  Psychiatric:        Attention and Perception: Attention and perception normal.  Mood and Affect: Mood is anxious and depressed. Affect is flat.        Behavior: Behavior normal.        Thought Content: Thought  content normal. Suicidal: passive ideations.        Cognition and Memory: Cognition normal.        Judgment: Judgment normal.   Review of Systems  HENT: Negative.    Eyes: Negative.   Respiratory: Negative.    Cardiovascular: Negative.   Genitourinary: Negative.   Neurological: Negative.   Endo/Heme/Allergies: Negative.   Psychiatric/Behavioral:  Positive for depression. Negative for substance abuse and suicidal ideas (passive). The patient is nervous/anxious and has insomnia.   All other systems reviewed and are negative. Blood pressure 119/83, pulse 89, temperature 98.7 F (37.1 C), temperature source Oral, SpO2 98 %. There is no height or weight on file to calculate BMI.  Musculoskeletal: Strength & Muscle Tone: within normal limits Gait & Station: normal Patient leans: N/A   Leon MSE Discharge Disposition for Follow up and Recommendations: Based on my evaluation the patient does not appear to have an emergency medical condition and can be discharged with resources and follow up care in outpatient services for Medication Management, Partial Hospitalization Program, Individual Therapy, and Group Therapy   [place], NP 11/28/2020, 2:03 PM

## 2020-11-28 NOTE — Discharge Summary (Signed)
[name] to be D/C'd Home per NP order. Discussed with the patient and all questions fully answered. An After Visit Summary was printed and given to the patient. Patient escorted out and D/C home via private auto.  Clois Dupes  11/28/2020 12:33 PM

## 2020-11-28 NOTE — Progress Notes (Signed)
   11/28/20 1130  [place] (Walk-ins at [facility] only)  How Did You Hear About [place]? Other (Comment) (EAP provider referred patient.)  What Is the Reason for Your Visit/Call Today? Patient called the crisis line after being referred by her EAP counselor.  Patient reports multiple stressors, to include dealing with a defiant 47 y.o.  She lost her father 10 years ago and her daughter acted out on father's day, which overwhelmed patient and she states she "lost it."  Patient has been unable to work since the end of May.  She states EAP counselor won't help her with leave of absence letter or recommendation.  She was told to come "anytime" to see a provider. After informing patient of the walk in hours, she initially stated she would come back.  She then shared she has [employer] and IOP options were discussed.  She would like to speak with a provider about this option.  She states she is not in crisis and denies SI and HI.  How Long Has This Been Causing You Problems? 1 wk - 1 month  Have You Recently Had Any Thoughts About Hurting Yourself? No  Are You Planning to Commit Suicide/Harm Yourself At This time? No  Have you Recently Had Thoughts About [place]? No  Are You Planning To Harm Someone At This Time? No  Are you currently experiencing any auditory, visual or other hallucinations? No  Have You Used Any Alcohol or Drugs in the Past 24 Hours? No  Do you have any current medical co-morbidities that require immediate attention? No  Clinician description of patient physical appearance/behavior: Patient is pleasant and cooperative, reporting feeling overwhelmed.  What Do You Feel Would Help You the Most Today? Treatment for Depression or other mood problem;Stress Management;Medication(s)  If access to [facility] was not available, would you have sought care in the Emergency Department? No  Determination of Need Routine (7 days)  Options For Referral Intensive Outpatient  Therapy;Medication Management

## 2020-11-29 NOTE — Consult Note (Addendum)
NP called [facility] pharmacy at 15:39 trazodone 5O mg may repeat in 1 hour. Patient has anticipated start to [facility] 8/30. Will follow-up and make medication adjustment at that time.  Attempted to contact patient to make aware of medications that was prdx. No answer

## 2020-11-30 ENCOUNTER — Other Ambulatory Visit: Payer: Self-pay

## 2020-11-30 ENCOUNTER — Other Ambulatory Visit (HOSPITAL_COMMUNITY): Payer: Commercial Managed Care - PPO | Attending: Psychiatry | Admitting: Licensed Clinical Social Worker

## 2020-11-30 DIAGNOSIS — F332 Major depressive disorder, recurrent severe without psychotic features: Secondary | ICD-10-CM

## 2020-11-30 DIAGNOSIS — F419 Anxiety disorder, unspecified: Secondary | ICD-10-CM | POA: Insufficient documentation

## 2020-11-30 DIAGNOSIS — F329 Major depressive disorder, single episode, unspecified: Secondary | ICD-10-CM | POA: Insufficient documentation

## 2020-12-01 ENCOUNTER — Other Ambulatory Visit (HOSPITAL_COMMUNITY): Payer: Commercial Managed Care - PPO | Admitting: Licensed Clinical Social Worker

## 2020-12-01 ENCOUNTER — Encounter (HOSPITAL_COMMUNITY): Payer: Self-pay

## 2020-12-01 ENCOUNTER — Other Ambulatory Visit (HOSPITAL_COMMUNITY): Payer: Commercial Managed Care - PPO | Admitting: Occupational Therapy

## 2020-12-01 ENCOUNTER — Other Ambulatory Visit: Payer: Self-pay

## 2020-12-01 DIAGNOSIS — F332 Major depressive disorder, recurrent severe without psychotic features: Secondary | ICD-10-CM

## 2020-12-01 DIAGNOSIS — F329 Major depressive disorder, single episode, unspecified: Secondary | ICD-10-CM | POA: Diagnosis present

## 2020-12-01 DIAGNOSIS — R4589 Other symptoms and signs involving emotional state: Secondary | ICD-10-CM

## 2020-12-01 DIAGNOSIS — F419 Anxiety disorder, unspecified: Secondary | ICD-10-CM | POA: Diagnosis not present

## 2020-12-01 DIAGNOSIS — R41844 Frontal lobe and executive function deficit: Secondary | ICD-10-CM

## 2020-12-01 NOTE — Therapy (Signed)
[place], [place], [address] Phone: [phone]   Fax:  [phone] Virtual Visit via Video Note  I connected with [name] on 12/01/20 at  9:00 AM EDT by a video enabled telemedicine application and verified that I am speaking with the correct person using two identifiers.  Location: Patient: Patient Home Provider: Clinic Office   I discussed the limitations of evaluation and management by telemedicine and the availability of in person appointments. The patient expressed understanding and agreed to proceed.   I discussed the assessment and treatment plan with the patient. The patient was provided an opportunity to ask questions and all were answered. The patient agreed with the plan and demonstrated an understanding of the instructions.   The patient was advised to call back or seek an in-person evaluation if the symptoms worsen or if the condition fails to improve as anticipated.  I provided 21 minutes of non-face-to-face time during this encounter. OT Evaluation  [name], OT   Occupational Therapy Evaluation  Patient Details  Name: [name] MRN: [id number] Date of Birth: Dec 10, 1975 No data recorded  Encounter Date: 12/01/2020   OT End of Session - 12/01/20 1042     Visit Number 1    Number of Visits 20    Date for OT Re-Evaluation 12/29/20    Authorization Type [employer]    OT Start Time [phone]    OT Stop Time 0946    OT Time Calculation (min) 21 min    Activity Tolerance Patient tolerated treatment well    Behavior During Therapy [name] for tasks assessed/performed             Past Medical History:  Diagnosis Date   Cervical dysplasia 1993    Migraine    Obesity     Past Surgical History:  Procedure Laterality Date   CESAREAN SECTION     x 4   COLPOSCOPY     TUBAL LIGATION Bilateral 1771   UMBILICAL HERNIA REPAIR  2007    There were no vitals  filed for this visit.   Subjective Assessment - 12/01/20 1041     Currently in Pain? No/denies                                OT Education - 12/01/20 1041     Education Details Educated on OT role within [employer]    Person(s) Educated Patient    Methods Explanation;Handout    Comprehension Verbalized understanding              OT Short Term Goals - 12/01/20 1043       OT SHORT TERM GOAL #1   Title Pt will actively engage in OT group sessions throughout duration of PHP programming, in order to promote daily structure, social engagement, and opportunities to develop and utilize adaptive strategies to maximize functional performance in preparation for safe transition and integration back into school, work, and the community.    Time 4    Period Weeks    Status New    [employer] Date 12/29/20      OT SHORT TERM GOAL #2   Title Pt will practice and identify 1-3 adaptive coping strategies she can utilize, in order to safely manage increased depression/anxiety, with min cues, in preparation for safe and healthy reintegration back into the community at discharge.  Time 4    Period Weeks    Status New    Target Date 12/29/20      OT SHORT TERM GOAL #3   Title Pt will identify 1-3 stress management strategies she can utilize, in order to safely manage increased psychosocial stressors identified, with min cues, in preparation for safe transition back to the community at discharge.    Time 4    Period Weeks    Status New    Target Date 12/29/20            Occupational Therapy Assessment 12/01/2020  [name] is a 45 y/o female with PMHx of depression and passive SI who was referred to the [name] program from the [name] with reports of worsening depression in the context of grief. Pt reports that last weekend was [name] and the 10 year anniversary of her Pamela passing, stirring up grief and worsening depression. 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 Pt also reports her teenage daughters as a stressor, recently being oppositional and difficult to manage. Pt lives at home with two of her four children, 30 and 62 y/o and her 24 y/o nephew. Pt reports desire to engage in [name] programming in order to manage identified stressors and to engage meaningfully in identified areas of occupation and ADL/iADLs. Upon approach, pt presents as cooperative, actively engaged, and eager to participate in OT Evaluation. Pt reports enjoying watching tv and doing things with her children and identifies goal for admission "learn how to deal with other people".   Precautions/Limitations: None noted/observed   Cognition: Appears intact    Visual Motor: Pt reports wearing glasses; not wearing during evaluation, pt reports they are in her car    Living Situation: Pt lives at home with 2 of her four children; daughters age 67 and 5, along with her 35 y/o nephew. Pt has two adult children 25 and 21 who live outside the home.   School/Work: Pt works FT as a city Recruitment consultant; reports being on a current LOA since 5/21 d/t anxiety  ADL/iADL Performance: Pt reports minimal difficulties with self-care/hygiene, reports taking two showers a [name]. Pt does report difficulty with sleep, averaging 3 hrs a night and infrequent appetite, sometimes eating 1 meal a [name]. Pt also reports a habit of stress eating   Leisure Interests and Hobbies: Pt reports having no/limited hobbies, however does report that she watches TV and likes to do activities with her kids like attend their cheer competitions or go to [employer]  Social Support: Pt reports having limited supports; reports both parents and sister are deceased. Pt reports having one friend who she can talk to "sometimes"   What do  you do when you are very stressed, angry, upset, sad or anxious? Isolate from others, Yell/Scream, Cry, and Buy things impulsively   What helps when you are not feeling well? Pt denies using any of the coping skills listed and reports that nothing helps her to feel better  What are some things that make it MORE difficult for you when you are already upset? Not having choices/input, Noise (in general), Not being able to express my opinion, Loud noises, People staring at me, Being criticized, and Having the bedroom door closed  Is there anything specific  that you would like help with while you're in the partial hospitalization program? Coping Skills and Impulse Control  What is your goal while you are here?  "Learn how to deal with other people"  Assessment: Pt demonstrates behavior that inhibits/restricts participation in occupation and would benefit from skilled occupational therapy services to address current difficulties with symptom management, emotion regulation, socialization, stress management, time management, job readiness, financial wellness, health and nutrition, sleep hygiene, ADL/iADL performance and leisure participation, in preparation for reintegration and return to community at discharge.   Plan: Pt will participate in skilled occupational therapy sessions (group and/or individual) in order to promote daily structure, social engagement, and opportunities to develop and utilize adaptive strategies to maximize functional performance in preparation for safe transition and integration back into school, work, and/or the community at discharge. OT sessions will occur 4-5 x per week for 2-4 weeks.   [name], MOT, OTR/L   Plan - 12/01/20 1042     Clinical Impression Statement Hal[place] is a 45 y/o female with PMHx of depression and passive SI who was referred to the [facility] program from the [name] with reports of worsening depression in the context of grief. Pt reports that last weekend  was [name] and the 10 year anniversary of her Pamela passing, stirring up grief and worsening depression. 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 Pt also reports her teenage daughters as a stressor, recently being oppositional and difficult to manage. Pt lives at home with two of her four children, 49 and 71 y/o and her 2 y/o nephew. Pt reports desire to engage in [place] programming in order to manage identified stressors and to engage meaningfully in identified areas of occupation and ADL/iADLs.    OT Occupational Profile and History Problem Focused Assessment - Including review of records relating to presenting problem    Occupational performance deficits (Please refer to evaluation for details): ADL's;IADL's;Rest and Sleep;Education;Work;Social Participation;Leisure    Body Structure / Function / Physical Skills ADL    Cognitive Skills Attention;Emotional;Energy/Drive;Learn;Memory;Perception;Problem Solve;Safety Awareness;Temperament/Personality;Thought;Understand    Psychosocial Skills Coping Strategies;Environmental  Adaptations;Habits;Interpersonal Interaction;Routines and Behaviors    Rehab Potential Good    Clinical Decision Making Limited treatment options, no task modification necessary    Comorbidities Affecting Occupational Performance: May have comorbidities impacting occupational performance    Modification or Assistance to Complete Evaluation  No modification of tasks or assist necessary to complete eval    OT Frequency 5x / week    OT Duration 4 weeks    OT Treatment/Interventions Self-care/ADL training;Patient/family education;Coping strategies training;Psychosocial skills training    Consulted and Agree with Plan of Care Patient             Patient will  benefit from skilled therapeutic intervention in order to improve the following deficits and impairments:   Body Structure / Function / Physical Skills: ADL Cognitive Skills: Attention, Emotional, Energy/Drive, Learn, Memory, Perception, Problem Solve, Safety Awareness, Temperament/Personality, Thought, Understand Psychosocial Skills: Coping Strategies, Environmental  Adaptations, Habits, Interpersonal Interaction, Routines and Behaviors   Visit Diagnosis: Difficulty coping  Frontal lobe and executive function deficit  Severe episode of recurrent major depressive disorder, without psychotic features [facility])    Problem List Patient Active Problem List   Diagnosis Date Noted   Exercise-induced  asthma 07/21/2018   Exertional dyspnea 12/15/2017   Elevated troponin    Mild obstructive sleep apnea 02/14/2015   Snoring 11/14/2014   Inguinal adenopathy 10/31/2014   Positive D dimer 10/17/2014   Pedal edema 10/14/2014   Fatigue 10/14/2014   Morbid obesity ([place]) 08/07/2006   TOBACCO DEPENDENCE 08/07/2006   MIGRAINE, UNSPEC., W/O INTRACTABLE MIGRAINE 08/07/2006   RHINITIS, ALLERGIC 08/07/2006    12/01/2020  [name], MOT, OTR/L  12/01/2020, 10:44 AM  [name] PARTIAL HOSPITALIZATION PROGRAM [address] [name], [place], [address] Phone: [phone]   Fax:  [phone]  Name: [name] MRN: [id number] Date of Birth: 02-25-1976

## 2020-12-01 NOTE — Progress Notes (Addendum)
T1 Virtual Visit via Video Note  I connected with [name] on 12/01/20 at  9:00 AM EDT by a video enabled telemedicine application and verified that I am speaking with the correct person using two identifiers.  Location: Patient:  Home  Provider: Office   I discussed the limitations of evaluation and management by telemedicine and the availability of in person appointments. The patient expressed understanding and agreed to proceed.   I discussed the assessment and treatment plan with the patient. The patient was provided an opportunity to ask questions and all were answered. The patient agreed with the plan and demonstrated an understanding of the instructions.   The patient was advised to call back or seek an in-person evaluation if the symptoms worsen or if the condition fails to improve as anticipated.  I provided 15 minutes of non-face-to-face time during this encounter.   Derrill Center, NP   Behavioral Health Partial Program Assessment Note  Date: 12/01/2020 Name: [name] MRN: [id number]  Chief Complaint:  Depression. " I feel like I don't have any control over my children anymore."      HPI: [name] is a 45 y.o. African American female presents with depression, and anxiety.  Patient was referred to [facility] by [facility] urgent care. As noted on assessment "[name] is a 45 y.o. female presents to [facility] urgent care seeking additional outpatient resources due to worsening depression and passive suicidal ideations.  [name] reports her main stressors is related to grief and loss of her father 10 years ago.  She reports the past Father's Day holiday brought back a lot of memories.  States she has 5 children. States that the 76 year old and 47 year old no longer reside in the home.  States 1 and 47 year old have been out of control and oppositional.  Stated her 50 year old daughter has been running away and taking her vehicle without permission.    States she has been disrespectful and defiant which is causing 47 year old to emulate similar behaviors.   [name] reports feeling overwhelmed states she does not feel like living any longer. She denied plan or intent. Stated she has removed all firearms from her home.  She denied previous suicide attempts.  Denied previous inpatient admissions.  Denies that she is followed by therapy or psychiatry currently.  Reported she was during virtual visit with EAP, however stated that this is not helpful thus fair. She states that she is not eating and sleeping enough. Reported racing thoughts,  poor concentration, depression, tearfulness and loneliness.     Reported protective factors are her children. "  I do not want them to experience what is like to not have parents."  Discussed starting partial hospitalization programming and possibly medication for mood stabilization." Patient was enrolled in partial psychiatric program on 12/01/20.  Primary complaints include: anxiety, depression worse, feeling suicidal, financial problems, poor concentration, and stressed at work.  Onset of symptoms was gradual with gradually worsening course since that time. Psychosocial Stressors include the following: family, financial, and occupational.   I have reviewed the following documentation dated 12/01/2020: past psychiatric history  Complaints of Pain: nonear Past Psychiatric History:  First psychiatric contact  and None  Currently in treatment with Trazodone 50 mg nightly   Substance Abuse History: none Use of Alcohol: denied Use of Caffeine: denies use Use of over the counter:   Past Surgical History:  Procedure Laterality Date   CESAREAN SECTION     x 4  COLPOSCOPY     TUBAL LIGATION Bilateral 3646   UMBILICAL HERNIA REPAIR  2007    Past Medical History:  Diagnosis Date   Cervical dysplasia 1993    Migraine    Obesity    No outpatient encounter medications on file as of 12/01/2020.   No  facility-administered encounter medications on file as of 12/01/2020.   Allergies  Allergen Reactions   Iodinated Diagnostic Agents Other (See Comments) and Nausea And Vomiting    Pt became hypotensive,lightheaded,near syncopal the last 2 times she had contrast( prior to 2007)////////NEW UPDATE, COUGHING, TROUBLE BREATHING AND HIVES PER PT//A.CALHOUN   Morphine And Related Itching    Social History   Tobacco Use   Smoking status: Former    Packs/day: 0.50    Pack years: 0.00    Types: Cigarettes    Quit date: 06/08/2018    Years since quitting: 2.4   Smokeless tobacco: Never  Substance Use Topics   Alcohol use: No    Alcohol/week: 0.0 standard drinks   Functioning Relationships: poor relationship with children Education: Other (Specify any learning disability, behavioral disorder, special education needs, difficulty reading.):  Other Pertinent History: None Family History  Problem Relation Age of Onset   Ovarian cancer Mother 28   Heart failure Mother    Depression Mother    Cancer Mother        uterine cancer    Heart failure Father 37       MI   Diabetes Father        type 2   Hypertension Father    Heart disease Sister      Review of Systems Constitutional: negative  Objective:  There were no vitals filed for this visit.  Physical Exam:   Mental Status Exam: Appearance:  Well groomed Psychomotor::  Within Normal Limits Attention span and concentration: Normal Behavior: calm and cooperative Speech:  normal pitch and normal volume Mood:  depressed Affect:  normal and mood-congruent Thought Process:  Coherent Thought Content:  Logical Orientation:  person, place, and time/date Cognition:  grossly intact Insight:  Intact Judgment:  Intact Estimate of Intelligence: Average Fund of knowledge: Aware of current events Memory: Recent and remote intact Abnormal movements: None Gait and station: Normal  Assessment:  Diagnosis: Major Depression Disorder    Indications for admission: inpatient care required if not in partial hospital program  Plan: Orders placed for occupational therapy ( OT)  patient enrolled in Partial Hospitalization Program, patient's current medications are to be continued, the following medications are being prescribed Trazdone 50mg  with plans to start Zoloft 25mg  daily, a comprehensive treatment plan will be developed, and side effects of medications have been reviewed with patient  Treatment options and alternatives reviewed with patient and patient understands the above plan. Treatment plan was reviewed and agreed upon by NP T.Daisy Lites and patient [facility] need for group services .   Derrill Center, NP

## 2020-12-02 ENCOUNTER — Encounter (HOSPITAL_COMMUNITY): Payer: Self-pay | Admitting: Family

## 2020-12-04 ENCOUNTER — Other Ambulatory Visit (HOSPITAL_COMMUNITY): Payer: Commercial Managed Care - PPO | Admitting: Licensed Clinical Social Worker

## 2020-12-04 ENCOUNTER — Other Ambulatory Visit: Payer: Self-pay

## 2020-12-04 ENCOUNTER — Encounter (HOSPITAL_COMMUNITY): Payer: Self-pay

## 2020-12-04 ENCOUNTER — Other Ambulatory Visit (HOSPITAL_COMMUNITY): Payer: Commercial Managed Care - PPO | Admitting: Occupational Therapy

## 2020-12-04 DIAGNOSIS — F332 Major depressive disorder, recurrent severe without psychotic features: Secondary | ICD-10-CM

## 2020-12-04 DIAGNOSIS — R41844 Frontal lobe and executive function deficit: Secondary | ICD-10-CM

## 2020-12-04 DIAGNOSIS — R4589 Other symptoms and signs involving emotional state: Secondary | ICD-10-CM

## 2020-12-04 DIAGNOSIS — F329 Major depressive disorder, single episode, unspecified: Secondary | ICD-10-CM | POA: Diagnosis not present
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
## 2020-12-04 NOTE — Therapy (Signed)
[name], [place], [address] Phone: [phone]   Fax:  [phone] Virtual Visit via Video Note  I connected with [name] on 12/04/20 at  11:00 AM EDT by a video enabled telemedicine application and verified that I am speaking with the correct person using two identifiers.  Location: Patient: Patient Home Provider: Clinic Office   I discussed the limitations of evaluation and management by telemedicine and the availability of in person appointments. The patient expressed understanding and agreed to proceed.  I discussed the assessment and treatment plan with the patient. The patient was provided an opportunity to ask questions and all were answered. The patient agreed with the plan and demonstrated an understanding of the instructions.   The patient was advised to call back or seek an in-person evaluation if the symptoms worsen or if the condition fails to improve as anticipated.  I provided 60 minutes of non-face-to-face time during this encounter.   [name], OT   Occupational Therapy Treatment  Patient Details  Name: [name] MRN: [id number] Date of Birth: 10/07/1975 Referring Provider (OT): Ricky Ala   Encounter Date: 12/04/2020   OT End of Session - 12/04/20 1338     Visit Number 2    Number of Visits 20    Date for OT Re-Evaluation 12/29/20    Authorization Type [employer]    OT Start Time 1115    OT Stop Time 1215    OT Time Calculation (min) 60 min    Activity Tolerance Patient tolerated treatment well    Behavior During Therapy [facility] for tasks assessed/performed             Past Medical History:  Diagnosis Date   Cervical dysplasia 06/11/1991   Depression    Migraine    Obesity     Past Surgical History:  Procedure Laterality Date   CESAREAN SECTION     x 4   COLPOSCOPY     TUBAL LIGATION Bilateral 1062   UMBILICAL HERNIA REPAIR   2007    There were no vitals filed for this visit.   Subjective Assessment - 12/04/20 1338     Currently in Pain? No/denies              OT Education - 12/04/20 1338     Education Details Educated on self-care and provided tips/strategies to improving specific areas of self-care as it relates to one's mental health    Person(s) Educated Patient    Methods Explanation;Handout    Comprehension Verbalized understanding              OT Short Term Goals - 12/04/20 1339       OT SHORT TERM GOAL #1   Status On-going      OT SHORT TERM GOAL #2   Status On-going      OT SHORT TERM GOAL #3   Status On-going            Group Session:  S: "I do really well with hygiene - I take showers or baths two times a day, minimum."  O: Today's group session focused on the topic of self-care and group member completed a self-care assessment that identified specific categories within self-care that needed improvement, including physical, emotional/psychological, social, spiritual, and professional. Discussion focused on identifying which areas need the most work/improvement and brainstormed strategies and tips to improve self-care. Group members were also encouraged to identify strengths  and areas of improvement.  A: Vanessa was active and independent in her participation of discussion and activity, sharing that her biggest self-care strength is taking care of her hygiene, sharing she showers a minimum of twice a day. 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 Pt proved receptive to support and strategies provided by group members.   P: Continue to attend PHP OT group sessions 5x week for 3 weeks to promote daily structure, social engagement, and opportunities to develop and utilize adaptive strategies to  maximize functional performance in preparation for safe transition and integration back into school, work, and the community. Plan to address topic of communication in next OT group session.  Plan - 12/04/20 1338     Occupational performance deficits (Please refer to evaluation for details): ADL's;IADL's;Rest and Sleep;Education;Work;Social Participation;Leisure    Body Structure / Function / Physical Skills ADL    Cognitive Skills Attention;Emotional;Energy/Drive;Learn;Memory;Perception;Problem Solve;Safety Awareness;Temperament/Personality;Thought;Understand    Psychosocial Skills Coping Strategies;Environmental  Adaptations;Habits;Interpersonal Interaction;Routines and Behaviors             Patient will benefit from skilled therapeutic intervention in order to improve the following deficits and impairments:   Body Structure / Function / Physical Skills: ADL Cognitive Skills: Attention, Emotional, Energy/Drive, Learn, Memory, Perception, Problem Solve, Safety Awareness, Temperament/Personality, Thought, Understand Psychosocial Skills: Coping Strategies, Environmental  Adaptations, Habits, Interpersonal Interaction, Routines and Behaviors   Visit Diagnosis: Difficulty coping  Frontal lobe and executive function deficit  Severe episode of recurrent major depressive disorder, without psychotic features [facility])    Problem List Patient Active Problem List   Diagnosis Date Noted   Exercise-induced asthma 07/21/2018   Exertional dyspnea 12/15/2017   Elevated troponin    Mild obstructive sleep apnea 02/14/2015   Snoring 11/14/2014   Inguinal adenopathy 10/31/2014   Positive D dimer 10/17/2014   Pedal edema 10/14/2014   Fatigue 10/14/2014   Morbid obesity ([place]) 08/07/2006   TOBACCO DEPENDENCE 08/07/2006   MIGRAINE, UNSPEC., W/O INTRACTABLE MIGRAINE 08/07/2006   RHINITIS, ALLERGIC 08/07/2006    12/04/2020  [name], MOT, OTR/L  12/04/2020, 1:39 PM  [place], [place], [address] Phone: [phone]   Fax:  [phone]  Name: [name] MRN: [id number] Date of Birth: [birth year]
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
## 2020-12-05 ENCOUNTER — Other Ambulatory Visit: Payer: Self-pay

## 2020-12-05 ENCOUNTER — Other Ambulatory Visit (HOSPITAL_COMMUNITY): Payer: Commercial Managed Care - PPO | Admitting: Occupational Therapy

## 2020-12-05 ENCOUNTER — Other Ambulatory Visit (HOSPITAL_COMMUNITY): Payer: Commercial Managed Care - PPO | Admitting: Licensed Clinical Social Worker

## 2020-12-05 ENCOUNTER — Encounter (HOSPITAL_COMMUNITY): Payer: Self-pay

## 2020-12-05 DIAGNOSIS — R4589 Other symptoms and signs involving emotional state: Secondary | ICD-10-CM

## 2020-12-05 DIAGNOSIS — F332 Major depressive disorder, recurrent severe without psychotic features: Secondary | ICD-10-CM

## 2020-12-05 DIAGNOSIS — F329 Major depressive disorder, single episode, unspecified: Secondary | ICD-10-CM | POA: Diagnosis not present

## 2020-12-05 DIAGNOSIS — R41844 Frontal lobe and executive function deficit: Secondary | ICD-10-CM

## 2020-12-05 NOTE — Therapy (Signed)
[place], [place], [address] Phone: [phone]   Fax:  [phone] Virtual Visit via Video Note  I connected with [name] on 12/05/20 at  11:00 AM EDT by a video enabled telemedicine application and verified that I am speaking with the correct person using two identifiers.  Location: Patient: Patient Home Provider: Clinic Office   I discussed the limitations of evaluation and management by telemedicine and the availability of in person appointments. The patient expressed understanding and agreed to proceed.   I discussed the assessment and treatment plan with the patient. The patient was provided an opportunity to ask questions and all were answered. The patient agreed with the plan and demonstrated an understanding of the instructions.   The patient was advised to call back or seek an in-person evaluation if the symptoms worsen or if the condition fails to improve as anticipated.  I provided 60 minutes of non-face-to-face time during this encounter.   Ponciano Ort, OT   Occupational Therapy Treatment  Patient Details  Name: [name] MRN: [id number] Date of Birth: 04-05-1976 Referring Provider (OT): Ricky Ala   Encounter Date: 12/05/2020   OT End of Session - 12/05/20 1447     Visit Number 3    Number of Visits 20    Date for OT Re-Evaluation 12/29/20    Authorization Type [employer]    OT Start Time 1105    OT Stop Time 1205    OT Time Calculation (min) 60 min    Activity Tolerance Patient tolerated treatment well    Behavior During Therapy [facility] for tasks assessed/performed             Past Medical History:  Diagnosis Date   Cervical dysplasia 06/11/1991   Depression    Migraine    Obesity     Past Surgical History:  Procedure Laterality Date   CESAREAN SECTION     x 4   COLPOSCOPY     TUBAL LIGATION Bilateral 8841   UMBILICAL HERNIA REPAIR   2007    There were no vitals filed for this visit.   Subjective Assessment - 12/05/20 1447     Currently in Pain? No/denies             OT Education - 12/05/20 1447     Education Details Educated on different communication styles and identified strategies/tips to practice being more assertive    Person(s) Educated Patient    Methods Explanation;Handout    Comprehension Verbalized understanding              OT Short Term Goals - 12/04/20 1339       OT SHORT TERM GOAL #1   Status On-going      OT SHORT TERM GOAL #2   Status On-going      OT SHORT TERM GOAL #3   Status On-going            Group Session:  S: "I like to be independent so I don't like asking for help."  O: Group began with a reflection from previous OT session on self-care. Today's group focused on topic of Communication Styles. Group members were educated on the different styles including passive, aggressive, and assertive communication. Members shared and reflected on which style they most often find themselves communicating in and how to transition to a more assertive approach.   A: [name] was active and independent in her participation of discussion  and activity, sharing that she likes to be independent, which makes it difficult for her to communicate her feelings or ask for help when she needs. She identified herself as a Psychologist, occupational, sharing she has difficulty asking for help, however noted she does not want to have to ask for help and that the people around her should 'just notice when I am not okay." Appeared receptive to education, support provided from peers, and listening to additional strategies to improve communication.   P: Continue to attend PHP OT group sessions 5x week for 2 weeks to promote daily structure, social engagement, and opportunities to develop and utilize adaptive strategies to maximize functional performance in preparation for safe transition and integration back into  school, work, and the community. Plan to address topic of assertiveness in next OT group session.   Plan - 12/05/20 1448     Occupational performance deficits (Please refer to evaluation for details): ADL's;IADL's;Rest and Sleep;Education;Work;Social Participation;Leisure    Body Structure / Function / Physical Skills ADL    Cognitive Skills Attention;Emotional;Energy/Drive;Learn;Memory;Perception;Problem Solve;Safety Awareness;Temperament/Personality;Thought;Understand    Psychosocial Skills Coping Strategies;Environmental  Adaptations;Habits;Interpersonal Interaction;Routines and Behaviors             Patient will benefit from skilled therapeutic intervention in order to improve the following deficits and impairments:   Body Structure / Function / Physical Skills: ADL Cognitive Skills: Attention, Emotional, Energy/Drive, Learn, Memory, Perception, Problem Solve, Safety Awareness, Temperament/Personality, Thought, Understand Psychosocial Skills: Coping Strategies, Environmental  Adaptations, Habits, Interpersonal Interaction, Routines and Behaviors   Visit Diagnosis: Difficulty coping  Frontal lobe and executive function deficit  Severe episode of recurrent major depressive disorder, without psychotic features [facility])    Problem List Patient Active Problem List   Diagnosis Date Noted   Exercise-induced asthma 07/21/2018   Exertional dyspnea 12/15/2017   Elevated troponin    Mild obstructive sleep apnea 02/14/2015   Snoring 11/14/2014   Inguinal adenopathy 10/31/2014   Positive D dimer 10/17/2014   Pedal edema 10/14/2014   Fatigue 10/14/2014   Morbid obesity ([place]) 08/07/2006   TOBACCO DEPENDENCE 08/07/2006   MIGRAINE, UNSPEC., W/O INTRACTABLE MIGRAINE 08/07/2006   RHINITIS, ALLERGIC 08/07/2006    12/05/2020  Ponciano Ort, MOT, OTR/L  12/05/2020, 2:48 PM  [facility] PARTIAL HOSPITALIZATION PROGRAM [address] [place], [place],  [address] Phone: [phone]   Fax:  [phone]  Name: [name] MRN: [id number] Date of Birth: [birth year]

## 2020-12-06 ENCOUNTER — Other Ambulatory Visit (HOSPITAL_COMMUNITY): Payer: Commercial Managed Care - PPO | Admitting: Licensed Clinical Social Worker

## 2020-12-06 ENCOUNTER — Other Ambulatory Visit: Payer: Self-pay

## 2020-12-06 ENCOUNTER — Encounter (HOSPITAL_COMMUNITY): Payer: Self-pay

## 2020-12-06 ENCOUNTER — Other Ambulatory Visit (HOSPITAL_COMMUNITY): Payer: Commercial Managed Care - PPO | Admitting: Occupational Therapy

## 2020-12-06 DIAGNOSIS — R41844 Frontal lobe and executive function deficit: Secondary | ICD-10-CM

## 2020-12-06 DIAGNOSIS — R4589 Other symptoms and signs involving emotional state: Secondary | ICD-10-CM

## 2020-12-06 DIAGNOSIS — F332 Major depressive disorder, recurrent severe without psychotic features: Secondary | ICD-10-CM

## 2020-12-06 DIAGNOSIS — F329 Major depressive disorder, single episode, unspecified: Secondary | ICD-10-CM | POA: Diagnosis not present

## 2020-12-06 NOTE — Therapy (Signed)
[name], [place], [address] Phone: [phone]   Fax:  [phone] Virtual Visit via Video Note  I connected with [name] on 12/06/20 at  12:00 PM EDT by a video enabled telemedicine application and verified that I am speaking with the correct person using two identifiers.  Location: Patient: Patient Home Provider: Clinic Office   I discussed the limitations of evaluation and management by telemedicine and the availability of in person appointments. The patient expressed understanding and agreed to proceed.   I discussed the assessment and treatment plan with the patient. The patient was provided an opportunity to ask questions and all were answered. The patient agreed with the plan and demonstrated an understanding of the instructions.   The patient was advised to call back or seek an in-person evaluation if the symptoms worsen or if the condition fails to improve as anticipated.  I provided 50 minutes of non-face-to-face time during this encounter.   Ponciano Ort, OT   Occupational Therapy Treatment  Patient Details  Name: [name] MRN: [id number] Date of Birth: [birth year] Referring Provider (OT): Ricky Ala   Encounter Date: 12/06/2020   OT End of Session - 12/06/20 1424     Visit Number 4    Number of Visits 20    Date for OT Re-Evaluation 12/29/20    Authorization Type [employer]    OT Start Time 1200    OT Stop Time 1250    OT Time Calculation (min) 50 min    Activity Tolerance Patient tolerated treatment well    Behavior During Therapy [facility] for tasks assessed/performed             Past Medical History:  Diagnosis Date   Cervical dysplasia 06/11/1991   Depression    Migraine    Obesity     Past Surgical History:  Procedure Laterality Date   CESAREAN SECTION     x 4   COLPOSCOPY     TUBAL LIGATION Bilateral 3419   UMBILICAL HERNIA REPAIR   2007    There were no vitals filed for this visit.   Subjective Assessment - 12/06/20 1423     Currently in Pain? No/denies              OT Education - 12/06/20 1423     Education Details Continued education on different communication styles with strategies to become more assertive with use of XYZ communication tool    Person(s) Educated Patient    Methods Explanation;Handout    Comprehension Verbalized understanding              OT Short Term Goals - 12/04/20 1339       OT SHORT TERM GOAL #1   Status On-going      OT SHORT TERM GOAL #2   Status On-going      OT SHORT TERM GOAL #3   Status On-going           Group Session:  S: "I didn't think I was passive, but based off the stuff you sent yesterday and after talking about it this morning, I think I am passive when I'm at work and aggressive when I'm at home."  O: Group began with a reflection from previous OT session focused on communication styles and group members re-iterated what was learned during previous session. Members shared and reflected on any opportunities they were presented with last evening to practice their assertiveness  skills or recognize patterns of communication observed. Today's group focused on assertiveness skills training and use of the XYZ* assertive communication tool was introduced. The XYZ communication tool states: I feel X when you do Y in situation Z and I would like _________. X is the emotion, Y is the specific behavior, and Z is the specific situation. Group members each formulated their own XYZ statement and shared with the group to discuss and offer feedback. Additional tips and strategies to practice being assertive were also introduced and discussed.   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   P: Continue to attend PHP OT group sessions 5x week for 2 weeks to promote daily structure, social engagement, and opportunities to develop and utilize adaptive strategies to maximize functional performance in preparation for safe transition and integration back into school, work, and the community. Plan to address topic of stress management in next OT group session.  Plan - 12/06/20 1424     Occupational performance deficits (Please refer to evaluation for details): ADL's;IADL's;Rest and Sleep;Education;Work;Social Participation;Leisure    Body Structure / Function / Physical Skills ADL    Cognitive Skills Attention;Emotional;Energy/Drive;Learn;Memory;Perception;Problem Solve;Safety Awareness;Temperament/Personality;Thought;Understand    Psychosocial Skills Coping Strategies;Environmental  Adaptations;Habits;Interpersonal Interaction;Routines and Behaviors             Patient will benefit from skilled therapeutic intervention in order to improve the following deficits and impairments:   Body Structure / Function / Physical Skills: ADL Cognitive Skills: Attention, Emotional, Energy/Drive, Learn, Memory, Perception, Problem Solve, Safety Awareness, Temperament/Personality, Thought, Understand Psychosocial Skills: Coping Strategies, Environmental  Adaptations, Habits, Interpersonal Interaction, Routines and Behaviors   Visit Diagnosis: Difficulty coping  Frontal lobe and executive function deficit  Severe episode of recurrent major depressive disorder, without psychotic features [facility])    Problem List Patient Active Problem List   Diagnosis Date Noted   Exercise-induced asthma 07/21/2018   Exertional dyspnea 12/15/2017   Elevated troponin    Mild obstructive sleep apnea 02/14/2015   Snoring 11/14/2014    Inguinal adenopathy 10/31/2014   Positive D dimer 10/17/2014   Pedal edema 10/14/2014   Fatigue 10/14/2014   Morbid obesity ([place]) 08/07/2006   TOBACCO DEPENDENCE 08/07/2006   MIGRAINE, UNSPEC., W/O INTRACTABLE MIGRAINE 08/07/2006   RHINITIS, ALLERGIC 08/07/2006    12/06/2020  Ponciano Ort, MOT, OTR/L  12/06/2020, 2:25 PM  [facility] PARTIAL HOSPITALIZATION PROGRAM [place], [place], [address] Phone: [phone]   Fax:  [phone]  Name: NOELI LAVERY MRN: [id number] Date of Birth: [birth year]

## 2020-12-06 NOTE — Psych (Signed)
Virtual Visit via Video Note  I connected with [name] on 12/01/20 at  9:00 AM EDT by a video enabled telemedicine application and verified that I am speaking with the correct person using two identifiers.  Location: Patient: patient home Provider: clinical home office   I discussed the limitations of evaluation and management by telemedicine and the availability of in person appointments. The patient expressed understanding and agreed to proceed.  I discussed the assessment and treatment plan with the patient. The patient was provided an opportunity to ask questions and all were answered. The patient agreed with the plan and demonstrated an understanding of the instructions.   The patient was advised to call back or seek an in-person evaluation if the symptoms worsen or if the condition fails to improve as anticipated.  Pt completed PHP intake process. Cln oriented pt to [facility] and answered any questions regarding treatment. Pt gives verbal consent to treatment, group commitments, and virtual treatment. Cln and pt completed treatment plan and pt states verbal alignment with plan.  Pt completed admission eval with NP T. Lewis and completed OT eval with cln C. Cycenas during this intake.  Pt will begin group sessions on 12/04/20 at 9am.   I provided 60 minutes of non-face-to-face time during this encounter.   Lorin Glass, LCSW

## 2020-12-07 ENCOUNTER — Other Ambulatory Visit (HOSPITAL_COMMUNITY): Payer: Commercial Managed Care - PPO | Admitting: Licensed Clinical Social Worker

## 2020-12-07 ENCOUNTER — Other Ambulatory Visit (HOSPITAL_COMMUNITY): Payer: Commercial Managed Care - PPO | Admitting: Occupational Therapy

## 2020-12-07 ENCOUNTER — Other Ambulatory Visit: Payer: Self-pay

## 2020-12-07 ENCOUNTER — Encounter (HOSPITAL_COMMUNITY): Payer: Self-pay

## 2020-12-07 DIAGNOSIS — R4589 Other symptoms and signs involving emotional state: Secondary | ICD-10-CM

## 2020-12-07 DIAGNOSIS — R41844 Frontal lobe and executive function deficit: Secondary | ICD-10-CM

## 2020-12-07 DIAGNOSIS — F329 Major depressive disorder, single episode, unspecified: Secondary | ICD-10-CM | POA: Diagnosis not present

## 2020-12-07 DIAGNOSIS — F332 Major depressive disorder, recurrent severe without psychotic features: Secondary | ICD-10-CM

## 2020-12-07 NOTE — Therapy (Signed)
[place], [place], [address] Phone: [phone]   Fax:  [phone] Virtual Visit via Video Note  I connected with [name] on 12/07/20 at  11:15 AM EDT by a video enabled telemedicine application and verified that I am speaking with the correct person using two identifiers.  Location: Patient: Patient Home Provider: Clinic Office   I discussed the limitations of evaluation and management by telemedicine and the availability of in person appointments. The patient expressed understanding and agreed to proceed.    I discussed the assessment and treatment plan with the patient. The patient was provided an opportunity to ask questions and all were answered. The patient agreed with the plan and demonstrated an understanding of the instructions.   The patient was advised to call back or seek an in-person evaluation if the symptoms worsen or if the condition fails to improve as anticipated.  I provided 60 minutes of non-face-to-face time during this encounter.   Ponciano Ort, OT   Occupational Therapy Treatment  Patient Details  Name: [name] MRN: [id number] Date of Birth: 1975/06/21 Referring Provider (OT): Ricky Ala   Encounter Date: 12/07/2020   OT End of Session - 12/07/20 1325     Visit Number 5    Number of Visits 20    Date for OT Re-Evaluation 12/29/20    Authorization Type [employer]    OT Start Time 1115    OT Stop Time 1215    OT Time Calculation (min) 60 min    Activity Tolerance Patient tolerated treatment well    Behavior During Therapy Flat affect;Anxious             Past Medical History:  Diagnosis Date   Cervical dysplasia 06/11/1991   Depression    Migraine    Obesity     Past Surgical History:  Procedure Laterality Date   CESAREAN SECTION     x 4   COLPOSCOPY     TUBAL LIGATION Bilateral 5427   UMBILICAL HERNIA REPAIR  2007     There were no vitals filed for this visit.   Subjective Assessment - 12/07/20 1325     Currently in Pain? No/denies                OT Education - 12/07/20 1325     Education Details Educated on sleep hygiene and strategies to improve overall sleep quality    Person(s) Educated Patient    Methods Explanation;Handout    Comprehension Verbalized understanding              OT Short Term Goals - 12/04/20 1339       OT SHORT TERM GOAL #1   Status On-going      OT SHORT TERM GOAL #2   Status On-going      OT SHORT TERM GOAL #3   Status On-going           Group Session:  S: None noted*  O: Today's group discussion focused on topic of Sleep Hygiene. Patients reflected on the quality of sleep they typically receive and identified areas that need improvement. Group was given background information on sleep and sleep hygiene, including common sleep disorders. Group members also received information on how to improve one's sleep and introduced a sleep diary as a tool that can be utilized to track sleep quality over a length of time. Group session ended with patients identifying one or more  strategies they could utilize or implement into their sleep routine in order to improve overall sleep quality.    A: Aliya was minimally engaged in today's session, despite encouragement to engage and participate. Of note, pt was upset in earlier session and may have been a factor in patient's lack of engagement. Pt did not present with camera on, however was logged on for duration of presentation and discussion. Will continue to encourage active engagement and participation in future sessions.   P: Continue to attend PHP OT group sessions 5x week for 2 weeks to promote daily structure, social engagement, and opportunities to develop and utilize adaptive strategies to maximize functional performance in preparation for safe transition and integration back into school, work, and the  community. Plan to address topic of wellness in next OT group session.  Plan - 12/07/20 1325     Occupational performance deficits (Please refer to evaluation for details): ADL's;IADL's;Rest and Sleep;Education;Work;Social Participation;Leisure    Body Structure / Function / Physical Skills ADL    Cognitive Skills Attention;Emotional;Energy/Drive;Learn;Memory;Perception;Problem Solve;Safety Awareness;Temperament/Personality;Thought;Understand    Psychosocial Skills Coping Strategies;Environmental  Adaptations;Habits;Interpersonal Interaction;Routines and Behaviors             Patient will benefit from skilled therapeutic intervention in order to improve the following deficits and impairments:   Body Structure / Function / Physical Skills: ADL Cognitive Skills: Attention, Emotional, Energy/Drive, Learn, Memory, Perception, Problem Solve, Safety Awareness, Temperament/Personality, Thought, Understand Psychosocial Skills: Coping Strategies, Environmental  Adaptations, Habits, Interpersonal Interaction, Routines and Behaviors   Visit Diagnosis: Difficulty coping  Frontal lobe and executive function deficit  Severe episode of recurrent major depressive disorder, without psychotic features [facility])    Problem List Patient Active Problem List   Diagnosis Date Noted   Exercise-induced asthma 07/21/2018   Exertional dyspnea 12/15/2017   Elevated troponin    Mild obstructive sleep apnea 02/14/2015   Snoring 11/14/2014   Inguinal adenopathy 10/31/2014   Positive D dimer 10/17/2014   Pedal edema 10/14/2014   Fatigue 10/14/2014   Morbid obesity ([place]) 08/07/2006   TOBACCO DEPENDENCE 08/07/2006   MIGRAINE, UNSPEC., W/O INTRACTABLE MIGRAINE 08/07/2006   RHINITIS, ALLERGIC 08/07/2006    12/07/2020  Ponciano Ort, MOT, OTR/L  12/07/2020, 1:26 PM  [place], [place], [address] Phone: [phone]   Fax:   [phone]  Name: BETSABE [name] MRN: [id number] Date of Birth: [birth year]

## 2020-12-08 ENCOUNTER — Other Ambulatory Visit (HOSPITAL_COMMUNITY): Payer: Medicaid Other

## 2020-12-08 ENCOUNTER — Other Ambulatory Visit: Payer: Self-pay

## 2020-12-08 ENCOUNTER — Other Ambulatory Visit (HOSPITAL_COMMUNITY): Payer: Medicaid Other | Attending: Psychiatry | Admitting: Licensed Clinical Social Worker

## 2020-12-08 DIAGNOSIS — F419 Anxiety disorder, unspecified: Secondary | ICD-10-CM | POA: Diagnosis not present

## 2020-12-08 DIAGNOSIS — Z79899 Other long term (current) drug therapy: Secondary | ICD-10-CM | POA: Diagnosis not present

## 2020-12-08 DIAGNOSIS — F332 Major depressive disorder, recurrent severe without psychotic features: Secondary | ICD-10-CM | POA: Insufficient documentation

## 2020-12-08 MED ORDER — HYDROXYZINE PAMOATE 25 MG PO CAPS: 25.0000 mg | ORAL_CAPSULE | Freq: Three times a day (TID) | ORAL | 0 refills | Status: DC | PRN

## 2020-12-08 NOTE — Progress Notes (Signed)
[place] MD/PA/NP OP Progress Note  12/10/2020 4:21 PM [name]  MRN:  [id number]  Chief Complaint:  [name] " I haven't been sleeping at all, I have racing thoughts all night."  Help was seen and evaluated via WebEx.  She continues to deny suicidal or homicidal ideations.  Denies auditory or visual hallucinations.  Continues to endorse racing thoughts, depression and low self esteem. Discussed starting antidepressant ie Zoloft and or Prozac, however patient declined. Stating " I don't want to be on medications." Patient was initiated on trazodone however she reports she does not feel as if medication was helping.  Discussed increasing 50 mg to 100 mg p.o. nightly as needed.  Patient was receptive to plan.  Will consider initiating hydroxyzine and/or discontinuing medications to start Remeron.  Patient reports she will do a little research we will follow-up.  Support, encouragement and reassurance was provided.   HPI: [name] is a 45 year old African-American female who presents with major depressive disorder and generalized anxiety.  Denied that she has been followed by therapy and/or psychiatry in the past.  Reports struggling with multiple stressors mainly related to family dynamics.   Visit Diagnosis:    ICD-10-CM   1. MDD (major depressive disorder), recurrent severe, without psychosis ([place])  F33.2       Past Psychiatric History:   Past Medical History:  Past Medical History:  Diagnosis Date   Cervical dysplasia 06/11/1991   Depression    Migraine    Obesity     Past Surgical History:  Procedure Laterality Date   CESAREAN SECTION     x 4   COLPOSCOPY     TUBAL LIGATION Bilateral 9833   UMBILICAL HERNIA REPAIR  2007    Family Psychiatric History:   Family History:  Family History  Problem Relation Age of Onset   Ovarian cancer Mother 56   Heart failure Mother    Depression Mother    Cancer Mother        uterine cancer    Heart failure Father 30       MI    Diabetes Father        type 2   Hypertension Father    Heart disease Sister     Social History:  Social History   Socioeconomic History   Marital status: Single    Spouse name: Not on file   Number of children: 4   Years of education: Not on file   Highest education level: Not on file  Occupational History    Employer: [employer]   Tobacco Use   Smoking status: Former    Packs/day: 0.50    Pack years: 0.00    Types: Cigarettes    Quit date: 06/08/2018    Years since quitting: 2.5   Smokeless tobacco: Never  Substance and Sexual Activity   Alcohol use: No    Alcohol/week: 0.0 standard drinks   Drug use: No   Sexual activity: Yes    Birth control/protection: None, Surgical  Other Topics Concern   Not on file  Social History Narrative   Not on file   Social Determinants of Health   Financial Resource Strain: Not on file  Food Insecurity: Not on file  Transportation Needs: Not on file  Physical Activity: Not on file  Stress: Not on file  Social Connections: Not on file    Allergies:  Allergies  Allergen Reactions   Iodinated Diagnostic Agents Other (See Comments) and Nausea And Vomiting  Pt became hypotensive,lightheaded,near syncopal the last 2 times she had contrast( prior to 2007)////////NEW UPDATE, COUGHING, TROUBLE BREATHING AND HIVES PER PT//A.CALHOUN   Morphine And Related Itching    Metabolic Disorder Labs: Lab Results  Component Value Date   HGBA1C 5.0 01/09/2018   MPG 103 05/19/2014   No results found for: PROLACTIN Lab Results  Component Value Date   CHOL 179 06/24/2018   TRIG 147 06/24/2018   HDL 58 06/24/2018   CHOLHDL 3.1 06/24/2018   VLDL 26 05/19/2014   LDLCALC 92 06/24/2018   LDLCALC 115 (H) 05/19/2014   Lab Results  Component Value Date   TSH 1.960 06/24/2018   TSH 2.649 12/15/2017    Therapeutic Level Labs: No results found for: LITHIUM No results found for: VALPROATE No components found for:  CBMZ  Current  Medications: Current Outpatient Medications  Medication Sig Dispense Refill   hydrOXYzine (VISTARIL) 25 MG capsule Take 1 capsule (25 mg total) by mouth 3 (three) times daily as needed. 30 capsule 0   RESTASIS 0.05 % ophthalmic emulsion Place 1 drop into both eyes 2 (two) times daily.     traZODone (DESYREL) 50 MG tablet Take 50 mg by mouth at bedtime.     No current facility-administered medications for this visit.     Musculoskeletal: Strength & Muscle Tone: within normal limits Gait & Station: normal Patient leans: N/A  Psychiatric Specialty Exam: Review of Systems  There were no vitals taken for this visit.There is no height or weight on file to calculate BMI.  General Appearance: Casual  Eye Contact:  Good  Speech:  Clear and Coherent  Volume:  Normal  Mood:  Anxious and Depressed  Affect:  Congruent  Thought Process:  Coherent  Orientation:  Full (Time, Place, and Person)  Thought Content: Logical   Suicidal Thoughts:  No  Homicidal Thoughts:  No  Memory:  Immediate;   Fair Recent;   Fair  Judgement:  Fair  Insight:  Fair  Psychomotor Activity:  Normal  Concentration:  Concentration: Good  Recall:  Good  Fund of Knowledge: Good  Language: Good  Akathisia:  No  Handed:  Right  AIMS (if indicated): done  Assets:  Communication Skills Social Support  ADL's:  Intact  Cognition: WNL  Sleep:  Fair   Screenings: GAD-7    Flowsheet Row Office Visit from 10/06/2019 in [facility] Office Visit from 09/21/2019 in [place] [facility] Video Visit from 01/12/2019 in [facility] Office Visit from 08/18/2018 in [place] Office Visit from 07/21/2018 in [facility]  Total GAD-7 Score 6 18 0 9 7      PHQ2-9    Flowsheet Row Counselor from 12/04/2020 in [facility] Office Visit from 10/06/2019 in [facility]  Office Visit from 09/21/2019 in [facility] Video Visit from 01/12/2019 in [facility] Office Visit from 08/18/2018 in [facility]  PHQ-2 Total Score 6 2 1  0 2  PHQ-9 Total Score 17 10 -- -- 8      Flowsheet Row Counselor from 12/04/2020 in [facility] ED from 06/28/2020 in [facility] Error: Q3, 4, or 5 should not be populated when Q2 is No No Risk        Assessment and Plan: Continue Partial Hospitalization  Program  Patient to increased Trazodone 50 mg to 100 mg po PRN nightly Initiate hydroxyzine 25mg  po PRN x tid  Patient to consider starting anitdepressent - ( she continues to be reluctant to starting medications. NP will continue to follow-up  Treatment plan was reviewed and agreed upon by NP T.Maliki Gignac and patient [facility] need for group services   [place], NP 12/10/2020, 4:21 PM

## 2020-12-10 ENCOUNTER — Encounter (HOSPITAL_COMMUNITY): Payer: Self-pay | Admitting: Family

## 2020-12-12 ENCOUNTER — Other Ambulatory Visit (HOSPITAL_COMMUNITY): Payer: Medicaid Other | Admitting: Occupational Therapy

## 2020-12-12 ENCOUNTER — Other Ambulatory Visit: Payer: Self-pay

## 2020-12-12 ENCOUNTER — Encounter (HOSPITAL_COMMUNITY): Payer: Self-pay

## 2020-12-12 ENCOUNTER — Other Ambulatory Visit (HOSPITAL_COMMUNITY): Payer: Medicaid Other | Admitting: Licensed Clinical Social Worker

## 2020-12-12 DIAGNOSIS — Z79899 Other long term (current) drug therapy: Secondary | ICD-10-CM | POA: Diagnosis not present

## 2020-12-12 DIAGNOSIS — F332 Major depressive disorder, recurrent severe without psychotic features: Secondary | ICD-10-CM

## 2020-12-12 DIAGNOSIS — F419 Anxiety disorder, unspecified: Secondary | ICD-10-CM | POA: Diagnosis not present

## 2020-12-12 DIAGNOSIS — R4589 Other symptoms and signs involving emotional state: Secondary | ICD-10-CM

## 2020-12-12 DIAGNOSIS — R41844 Frontal lobe and executive function deficit: Secondary | ICD-10-CM

## 2020-12-12 NOTE — Therapy (Signed)
[facility] [place], [place], [address] Phone: [phone]   Fax:  [phone] Virtual Visit via Video Note  I connected with [name] on 12/12/20 at  11:00 AM EDT by a video enabled telemedicine application and verified that I am speaking with the correct person using two identifiers.  Location: Patient: Patient Home Provider: Clinic Office   I discussed the limitations of evaluation and management by telemedicine and the availability of in person appointments. The patient expressed understanding and agreed to proceed.  I discussed the assessment and treatment plan with the patient. The patient was provided an opportunity to ask questions and all were answered. The patient agreed with the plan and demonstrated an understanding of the instructions.   The patient was advised to call back or seek an in-person evaluation if the symptoms worsen or if the condition fails to improve as anticipated.  I provided 60 minutes of non-face-to-face time during this encounter.   Ponciano Ort, OT   Occupational Therapy Treatment  Patient Details  Name: CHRISLYNN MOSELY MRN: [id number] Date of Birth: 02-03-1976 Referring Provider (OT): Ricky Ala   Encounter Date: 12/12/2020   OT End of Session - 12/12/20 1433     Visit Number 6    Number of Visits 20    Date for OT Re-Evaluation 12/29/20    Authorization Type [employer]    OT Start Time 1115    OT Stop Time 1215    OT Time Calculation (min) 60 min    Activity Tolerance Patient tolerated treatment well    Behavior During Therapy [facility] for tasks assessed/performed             Past Medical History:  Diagnosis Date   Cervical dysplasia 06/11/1991   Depression    Migraine    Obesity     Past Surgical History:  Procedure Laterality Date   CESAREAN SECTION     x 4   COLPOSCOPY     TUBAL LIGATION Bilateral 3244   UMBILICAL HERNIA REPAIR  2007     There were no vitals filed for this visit.   Subjective Assessment - 12/12/20 1433     Currently in Pain? No/denies              OT Education - 12/12/20 1433     Education Details Educated on the 5 F's and provided resources/strategies/tips to improve overall health and wellness    Person(s) Educated Patient    Methods Explanation;Handout    Comprehension Verbalized understanding              OT Short Term Goals - 12/04/20 1339       OT SHORT TERM GOAL #1   Status On-going      OT SHORT TERM GOAL #2   Status On-going      OT SHORT TERM GOAL #3   Status On-going           Group Session:  S: "I need to do more fitness activities, that's where I definitely struggle the most."  O: Today's group session focused on the topic of health and wellness as it relates to the impact on mental health. Discussion focused on identifying the 5 F's to wellness including Food, Fitness, Fresh air, Fellowship, and Friendship with self and soul. Group members identified areas of wellness that they would like to improve upon and were educated and offered various resources. Discussion also focused on how the  food we eat impacts our mental health, along with the benefits of engaging in physical activity/exercise and getting outside for fresh air. Discussion wrapped up with group members identifying one area of wellness they could improve upon and identified a strategy to do so.    A: Shreshta was active and independent in her participation of discussion and activity, sharing that she struggles most with fitness when it comes to her overall wellness. She was not able to identify any specific activity or exercise she could engage in to promote increased physical activity, however appeared open and receptive to suggestions from peers. Appeared receptive to further education on improving overall wellness.   P: Continue to attend PHP OT group sessions 5x week for 1 weeks to promote daily  structure, social engagement, and opportunities to develop and utilize adaptive strategies to maximize functional performance in preparation for safe transition and integration back into school, work, and the community.    Plan - 12/12/20 1433     Occupational performance deficits (Please refer to evaluation for details): ADL's;IADL's;Rest and Sleep;Education;Work;Social Participation;Leisure    Body Structure / Function / Physical Skills ADL    Cognitive Skills Attention;Emotional;Energy/Drive;Learn;Memory;Perception;Problem Solve;Safety Awareness;Temperament/Personality;Thought;Understand    Psychosocial Skills Coping Strategies;Environmental  Adaptations;Habits;Interpersonal Interaction;Routines and Behaviors             Patient will benefit from skilled therapeutic intervention in order to improve the following deficits and impairments:   Body Structure / Function / Physical Skills: ADL Cognitive Skills: Attention, Emotional, Energy/Drive, Learn, Memory, Perception, Problem Solve, Safety Awareness, Temperament/Personality, Thought, Understand Psychosocial Skills: Coping Strategies, Environmental  Adaptations, Habits, Interpersonal Interaction, Routines and Behaviors   Visit Diagnosis: Difficulty coping  Frontal lobe and executive function deficit  Severe episode of recurrent major depressive disorder, without psychotic features [facility])    Problem List Patient Active Problem List   Diagnosis Date Noted   Exercise-induced asthma 07/21/2018   Exertional dyspnea 12/15/2017   Elevated troponin    Mild obstructive sleep apnea 02/14/2015   Snoring 11/14/2014   Inguinal adenopathy 10/31/2014   Positive D dimer 10/17/2014   Pedal edema 10/14/2014   Fatigue 10/14/2014   Morbid obesity ([place]) 08/07/2006   TOBACCO DEPENDENCE 08/07/2006   MIGRAINE, UNSPEC., W/O INTRACTABLE MIGRAINE 08/07/2006   RHINITIS, ALLERGIC 08/07/2006    12/12/2020  Ponciano Ort, MOT, OTR/L  12/12/2020,  2:34 PM  [facility] [place], [place], [address] Phone: [phone]   Fax:  [phone]  Name: NUSRAT ENCARNACION MRN: [id number] Date of Birth: [birth year]

## 2020-12-13 ENCOUNTER — Other Ambulatory Visit (HOSPITAL_COMMUNITY): Payer: Medicaid Other

## 2020-12-13 ENCOUNTER — Other Ambulatory Visit: Payer: Self-pay

## 2020-12-13 ENCOUNTER — Other Ambulatory Visit (HOSPITAL_COMMUNITY): Payer: Commercial Managed Care - PPO

## 2020-12-13 ENCOUNTER — Telehealth (HOSPITAL_COMMUNITY): Payer: Self-pay | Admitting: Licensed Clinical Social Worker

## 2020-12-14 ENCOUNTER — Other Ambulatory Visit (HOSPITAL_COMMUNITY): Payer: Medicaid Other | Admitting: Licensed Clinical Social Worker

## 2020-12-14 ENCOUNTER — Other Ambulatory Visit (HOSPITAL_COMMUNITY): Payer: Medicaid Other | Admitting: Occupational Therapy

## 2020-12-14 ENCOUNTER — Other Ambulatory Visit: Payer: Self-pay

## 2020-12-14 ENCOUNTER — Encounter (HOSPITAL_COMMUNITY): Payer: Self-pay

## 2020-12-14 DIAGNOSIS — F332 Major depressive disorder, recurrent severe without psychotic features: Secondary | ICD-10-CM | POA: Diagnosis not present

## 2020-12-14 DIAGNOSIS — R41844 Frontal lobe and executive function deficit: Secondary | ICD-10-CM

## 2020-12-14 DIAGNOSIS — R4589 Other symptoms and signs involving emotional state: Secondary | ICD-10-CM

## 2020-12-14 NOTE — Therapy (Signed)
[facility] [place], [place], [address] Phone: [phone]   Fax:  [phone] Virtual Visit via Video Note  I connected with [name] on 12/14/20 at  11:00 AM EDT by a video enabled telemedicine application and verified that I am speaking with the correct person using two identifiers.  Location: Patient: Patient Home Provider: Clinic Office   I discussed the limitations of evaluation and management by telemedicine and the availability of in person appointments. The patient expressed understanding and agreed to proceed.   I discussed the assessment and treatment plan with the patient. The patient was provided an opportunity to ask questions and all were answered. The patient agreed with the plan and demonstrated an understanding of the instructions.   The patient was advised to call back or seek an in-person evaluation if the symptoms worsen or if the condition fails to improve as anticipated.  I provided 60 minutes of non-face-to-face time during this encounter.   Ponciano Ort, OT   Occupational Therapy Treatment  Patient Details  Name: [name] MRN: [id number] Date of Birth: [birth year] Referring Provider (OT): Ricky Ala   Encounter Date: 12/14/2020   OT End of Session - 12/14/20 1257     Visit Number 7    Number of Visits 20    Date for OT Re-Evaluation 12/29/20    Authorization Type [employer]    OT Start Time 1115    OT Stop Time 1215    OT Time Calculation (min) 60 min    Activity Tolerance Patient tolerated treatment well    Behavior During Therapy [facility] for tasks assessed/performed             Past Medical History:  Diagnosis Date   Cervical dysplasia 06/11/1991   Depression    Migraine    Obesity     Past Surgical History:  Procedure Laterality Date   CESAREAN SECTION     x 4   COLPOSCOPY     TUBAL LIGATION Bilateral 4010   UMBILICAL HERNIA REPAIR   2007    There were no vitals filed for this visit.   Subjective Assessment - 12/14/20 1257     Currently in Pain? No/denies             OT Education - 12/14/20 1257     Education Details Educated on identifying worry and utilized circle on control tool to categorize what we do and do not have control over    Person(s) Educated Patient    Methods Explanation;Handout    Comprehension Verbalized understanding              OT Short Term Goals - 12/04/20 1339       OT SHORT TERM GOAL #1   Status On-going      OT SHORT TERM GOAL #2   Status On-going      OT SHORT TERM GOAL #3   Status On-going           Group Session:  S: "I worry about big and small things like what others think about me and the safety of my kids."  O: Group session encouraged increased participation and engagement through discussion focused on worry and our circle of control. Group reviewed a powerpoint that discussed healthy vs unhealthy worry with specific examples provided. Discussion also focused on utilizing the circle of control outline to identify what is within our control, what we have influence on, and what  is not in our control. Group members shared specific examples and worries and identified what categories they fell in within the circle of control.   A: [name] was actively engaged in discussion and appeared attentive to education provided on circle of control, sharing that she worries about big and small things including what other people think about her and the safety of her kids when she is not with them. As group progressed, pt appeared to become increasingly distracted, having to leave early to drop her daughter off to an appointment. Pt did not actively practice use of control circle in group.   P: Continue to attend PHP OT group sessions 5x week for 1 weeks to promote daily structure, social engagement, and opportunities to develop and utilize adaptive strategies to maximize functional  performance in preparation for safe transition and integration back into school, work, and the community.    Plan - 12/14/20 1258     Occupational performance deficits (Please refer to evaluation for details): ADL's;IADL's;Rest and Sleep;Education;Work;Social Participation;Leisure    Body Structure / Function / Physical Skills ADL    Cognitive Skills Attention;Emotional;Energy/Drive;Learn;Memory;Perception;Problem Solve;Safety Awareness;Temperament/Personality;Thought;Understand    Psychosocial Skills Coping Strategies;Environmental  Adaptations;Habits;Interpersonal Interaction;Routines and Behaviors             Patient will benefit from skilled therapeutic intervention in order to improve the following deficits and impairments:   Body Structure / Function / Physical Skills: ADL Cognitive Skills: Attention, Emotional, Energy/Drive, Learn, Memory, Perception, Problem Solve, Safety Awareness, Temperament/Personality, Thought, Understand Psychosocial Skills: Coping Strategies, Environmental  Adaptations, Habits, Interpersonal Interaction, Routines and Behaviors   Visit Diagnosis: Difficulty coping  Frontal lobe and executive function deficit  Severe episode of recurrent major depressive disorder, without psychotic features [facility])    Problem List Patient Active Problem List   Diagnosis Date Noted   Exercise-induced asthma 07/21/2018   Exertional dyspnea 12/15/2017   Elevated troponin    Mild obstructive sleep apnea 02/14/2015   Snoring 11/14/2014   Inguinal adenopathy 10/31/2014   Positive D dimer 10/17/2014   Pedal edema 10/14/2014   Fatigue 10/14/2014   Morbid obesity ([place]) 08/07/2006   TOBACCO DEPENDENCE 08/07/2006   MIGRAINE, UNSPEC., W/O INTRACTABLE MIGRAINE 08/07/2006   RHINITIS, ALLERGIC 08/07/2006    12/14/2020  Ponciano Ort, MOT, OTR/L  12/14/2020, 12:58 PM  [facility] [address] [place],  [place], [address] Phone: [phone]   Fax:  [phone]  Name: [name] MRN: [id number] Date of Birth: 1975/11/26

## 2020-12-15 ENCOUNTER — Other Ambulatory Visit (HOSPITAL_COMMUNITY): Payer: Medicaid Other | Admitting: Occupational Therapy

## 2020-12-15 ENCOUNTER — Other Ambulatory Visit: Payer: Self-pay

## 2020-12-15 ENCOUNTER — Other Ambulatory Visit (HOSPITAL_COMMUNITY): Payer: Medicaid Other | Admitting: Licensed Clinical Social Worker

## 2020-12-15 ENCOUNTER — Encounter (HOSPITAL_COMMUNITY): Payer: Self-pay

## 2020-12-15 DIAGNOSIS — R4589 Other symptoms and signs involving emotional state: Secondary | ICD-10-CM

## 2020-12-15 DIAGNOSIS — F332 Major depressive disorder, recurrent severe without psychotic features: Secondary | ICD-10-CM

## 2020-12-15 DIAGNOSIS — R41844 Frontal lobe and executive function deficit: Secondary | ICD-10-CM

## 2020-12-15 NOTE — Progress Notes (Signed)
Spoke with patient via Webex video call, used 2 identifiers to correctly identify patient. States that groups are going well, she finds them helpful working out issues. Her sleep continues to be poor. Discussed good sleep hygiene and how to take her medication. Has racing thoughts before bed and is unable to "shut her brian off" to sleep. She is going to clean her C-Pap machine and start using it again. She has not had a menstrual cycle in 2 years and believes this affects her moods. Has passive suicidal thoughts at night when she can't sleep. No intent, contracts for safety. Denies HI and AV hallucinations. On scale 1-10 as 10 being worst she rates depression at 6.5 and anxiety at 6.5. No other issues or complaints. No side effects from medication.

## 2020-12-15 NOTE — Therapy (Signed)
[name], [place], [address] Phone: [phone]   Fax:  [phone] Virtual Visit via Video Note  I connected with [name] on 12/15/20 at  11:00 AM EDT by a video enabled telemedicine application and verified that I am speaking with the correct person using two identifiers.  Location: Patient: Patient Home Provider: Clinic Office   I discussed the limitations of evaluation and management by telemedicine and the availability of in person appointments. The patient expressed understanding and agreed to proceed.    I discussed the assessment and treatment plan with the patient. The patient was provided an opportunity to ask questions and all were answered. The patient agreed with the plan and demonstrated an understanding of the instructions.   The patient was advised to call back or seek an in-person evaluation if the symptoms worsen or if the condition fails to improve as anticipated.  I provided 60 minutes of non-face-to-face time during this encounter.   [name], OT   Occupational Therapy Treatment  Patient Details  Name: [name] MRN: [id number] Date of Birth: [birth year] Referring Provider (OT): Ricky Ala   Encounter Date: 12/15/2020   OT End of Session - 12/15/20 1453     Visit Number 8    Number of Visits 20    Date for OT Re-Evaluation 12/29/20    Authorization Type [employer]    OT Start Time 1115    OT Stop Time 1215    OT Time Calculation (min) 60 min    Activity Tolerance Patient tolerated treatment well    Behavior During Therapy [facility] for tasks assessed/performed             Past Medical History:  Diagnosis Date   Cervical dysplasia 06/11/1991   Depression    Migraine    Obesity     Past Surgical History:  Procedure Laterality Date   CESAREAN SECTION     x 4   COLPOSCOPY     TUBAL LIGATION Bilateral 4401   UMBILICAL HERNIA REPAIR   2007    There were no vitals filed for this visit.   Subjective Assessment - 12/15/20 1452     Currently in Pain? No/denies                                  OT Education - 12/15/20 1453     Education Details Educated on concept of sensory modulation and self-soothing as coping strategies through use of the eight senses    Person(s) Educated Patient    Methods Explanation;Handout    Comprehension Verbalized understanding              OT Short Term Goals - 12/04/20 1339       OT SHORT TERM GOAL #1   Status On-going      OT SHORT TERM GOAL #2   Status On-going      OT SHORT TERM GOAL #3   Status On-going           Group Session:  S: None noted*  O: Today's group session focused on topic of sensory modulation and self-soothing through use of the 8 senses. Discussion introduced the concept of sensory modulation and integration, focusing on how we can utilize our body and it's senses to self-soothe or cope, when we are experiencing an over or under-whelming sensation or feeling. Group members  were introduced to a sensory diet checklist as a helpful tool/resource that can be utilized to identify what activities and strategies we prefer and do not prefer based upon our response to different stimulus. The concept of alerting vs calming activities was also introduced to understand how to counteract how we are feeling (Example: when we are feeling overwhelmed/stressed, engage in something calming. When we are feeling depressed/low energy, engage in something alerting). Group members engaged actively in discussion sharing their own personal sensory likes/dislikes.     A: Shakayla was active and independent in her participation of discussion and activity, sharing sensory strategies and preferences that she has used in the past and current. Pt expressed benefit of going over and creating a sensory diet checklist and while present identified several calming  activities including dancing, walking, and cleaning. Pt identified several alerting activities including listening to music. Appeared open and receptive to education received on use of sensory strategies as a way to self-soothe.   P: Continue to attend PHP OT group sessions 5x week for 2 weeks to promote daily structure, social engagement, and opportunities to develop and utilize adaptive strategies to maximize functional performance in preparation for safe transition and integration back into school, work, and the community.   Plan - 12/15/20 1453     Occupational performance deficits (Please refer to evaluation for details): ADL's;IADL's;Rest and Sleep;Education;Work;Social Participation;Leisure    Body Structure / Function / Physical Skills ADL    Cognitive Skills Attention;Emotional;Energy/Drive;Learn;Memory;Perception;Problem Solve;Safety Awareness;Temperament/Personality;Thought;Understand    Psychosocial Skills Coping Strategies;Environmental  Adaptations;Habits;Interpersonal Interaction;Routines and Behaviors             Patient will benefit from skilled therapeutic intervention in order to improve the following deficits and impairments:   Body Structure / Function / Physical Skills: ADL Cognitive Skills: Attention, Emotional, Energy/Drive, Learn, Memory, Perception, Problem Solve, Safety Awareness, Temperament/Personality, Thought, Understand Psychosocial Skills: Coping Strategies, Environmental  Adaptations, Habits, Interpersonal Interaction, Routines and Behaviors   Visit Diagnosis: Difficulty coping  Frontal lobe and executive function deficit  Severe episode of recurrent major depressive disorder, without psychotic features [facility])    Problem List Patient Active Problem List   Diagnosis Date Noted   Exercise-induced asthma 07/21/2018   Exertional dyspnea 12/15/2017   Elevated troponin    Mild obstructive sleep apnea 02/14/2015   Snoring 11/14/2014   Inguinal  adenopathy 10/31/2014   Positive D dimer 10/17/2014   Pedal edema 10/14/2014   Fatigue 10/14/2014   Morbid obesity ([place]) 08/07/2006   TOBACCO DEPENDENCE 08/07/2006   MIGRAINE, UNSPEC., W/O INTRACTABLE MIGRAINE 08/07/2006   RHINITIS, ALLERGIC 08/07/2006    12/15/2020  [name], MOT, OTR/L  12/15/2020, 2:53 PM  [facility] PARTIAL HOSPITALIZATION PROGRAM [place], [place], [address] Phone: [phone]   Fax:  [phone]  Name: SIANA PANAMENO MRN: [id number] Date of Birth: 1975/07/24

## 2020-12-18 ENCOUNTER — Other Ambulatory Visit (HOSPITAL_COMMUNITY): Payer: Medicaid Other

## 2020-12-18 ENCOUNTER — Other Ambulatory Visit: Payer: Self-pay

## 2020-12-18 ENCOUNTER — Other Ambulatory Visit (HOSPITAL_COMMUNITY): Payer: Commercial Managed Care - PPO

## 2020-12-18 ENCOUNTER — Telehealth (HOSPITAL_COMMUNITY): Payer: Self-pay | Admitting: Licensed Clinical Social Worker

## 2020-12-18 ENCOUNTER — Other Ambulatory Visit: Payer: Self-pay | Admitting: Gastroenterology

## 2020-12-19 ENCOUNTER — Other Ambulatory Visit (HOSPITAL_COMMUNITY): Payer: Medicaid Other | Admitting: Licensed Clinical Social Worker

## 2020-12-19 ENCOUNTER — Other Ambulatory Visit: Payer: Self-pay

## 2020-12-19 ENCOUNTER — Other Ambulatory Visit (HOSPITAL_COMMUNITY): Payer: Medicaid Other | Admitting: Occupational Therapy

## 2020-12-19 ENCOUNTER — Encounter (HOSPITAL_COMMUNITY): Payer: Self-pay

## 2020-12-19 DIAGNOSIS — F332 Major depressive disorder, recurrent severe without psychotic features: Secondary | ICD-10-CM

## 2020-12-19 DIAGNOSIS — R4589 Other symptoms and signs involving emotional state: Secondary | ICD-10-CM

## 2020-12-19 DIAGNOSIS — R41844 Frontal lobe and executive function deficit: Secondary | ICD-10-CM

## 2020-12-19 MED ORDER — SERTRALINE HCL 50 MG PO TABS: 50.0000 mg | ORAL_TABLET | Freq: Every day | ORAL | 2 refills | Status: DC
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
## 2020-12-19 NOTE — Therapy (Signed)
[place], [place], [address] Phone: [phone]   Fax:  [phone] Virtual Visit via Video Note  I connected with [name] on 12/19/20 at  11:00 AM EDT by a video enabled telemedicine application and verified that I am speaking with the correct person using two identifiers.  Location: Patient: Patient Home Provider: Clinic Office   I discussed the limitations of evaluation and management by telemedicine and the availability of in person appointments. The patient expressed understanding and agreed to proceed.   I discussed the assessment and treatment plan with the patient. The patient was provided an opportunity to ask questions and all were answered. The patient agreed with the plan and demonstrated an understanding of the instructions.   The patient was advised to call back or seek an in-person evaluation if the symptoms worsen or if the condition fails to improve as anticipated.  I provided 60 minutes of non-face-to-face time during this encounter.   Ponciano Ort, OT   Occupational Therapy Treatment  Patient Details  Name: [name] MRN: [id number] Date of Birth: 08-03-1975 Referring Provider (OT): Ricky Ala   Encounter Date: 12/19/2020   OT End of Session - 12/19/20 1628     Visit Number 9    Number of Visits 20    Date for OT Re-Evaluation 12/29/20    Authorization Type [employer]    OT Start Time 1100    OT Stop Time 1200    OT Time Calculation (min) 60 min    Activity Tolerance Patient tolerated treatment well    Behavior During Therapy [facility] for tasks assessed/performed             Past Medical History:  Diagnosis Date   Cervical dysplasia 06/11/1991   Depression    Migraine    Obesity     Past Surgical History:  Procedure Laterality Date   CESAREAN SECTION     x 4   COLPOSCOPY     TUBAL LIGATION Bilateral 6962   UMBILICAL HERNIA REPAIR   2007    There were no vitals filed for this visit.   Subjective Assessment - 12/19/20 1628     Currently in Pain? No/denies                                  OT Education - 12/19/20 1628     Education Details Educated on different factors that contribute to our ability to manage our time, along with specific time management tips/strategies, including procrastination    Person(s) Educated Patient    Methods Explanation;Handout    Comprehension Verbalized understanding              OT Short Term Goals - 12/04/20 1339       OT SHORT TERM GOAL #1   Status On-going      OT SHORT TERM GOAL #2   Status On-going      OT SHORT TERM GOAL #3   Status On-going           Group Session:  S: None noted  O: Group began with a warm-up ice breaker activity where patients were encouraged to reflect on the scenario "If you had 86,400 dollars dropped into your bank account at midnight and 24 hours to spend it, what you use the money for? The only rule was that any money not used disappeared  after 24 hours." Warm-up activity was used as an Designer, industrial/product and a way to connect that similar to the scenario of money, we do not get time back and there are 86,400 seconds in a day. Warm-up transitioned into today's group focused on topic of Time Management. Group members identified ways in which they currently struggle with managing their time and discussion focused on alternative strategies to recognize how we can be more productive and intentional with our time and managing it appropriately. Group members also discussed specific strategies to overcome procrastination.   A: [name] was largely an observer during today's session, not contributing to discussion. Will continue to encourage active engagement in future sessions.   P: Continue to attend PHP OT group sessions 5x week for 2 weeks to promote daily structure, social engagement, and opportunities to develop and utilize  adaptive strategies to maximize functional performance in preparation for safe transition and integration back into school, work, and the community. Plan to address topic of procrastination in next OT group session.  Plan - 12/19/20 1628     Occupational performance deficits (Please refer to evaluation for details): ADL's;IADL's;Rest and Sleep;Education;Work;Social Participation;Leisure    Body Structure / Function / Physical Skills ADL    Cognitive Skills Attention;Emotional;Energy/Drive;Learn;Memory;Perception;Problem Solve;Safety Awareness;Temperament/Personality;Thought;Understand    Psychosocial Skills Coping Strategies;Environmental  Adaptations;Habits;Interpersonal Interaction;Routines and Behaviors             Patient will benefit from skilled therapeutic intervention in order to improve the following deficits and impairments:   Body Structure / Function / Physical Skills: ADL Cognitive Skills: Attention, Emotional, Energy/Drive, Learn, Memory, Perception, Problem Solve, Safety Awareness, Temperament/Personality, Thought, Understand Psychosocial Skills: Coping Strategies, Environmental  Adaptations, Habits, Interpersonal Interaction, Routines and Behaviors   Visit Diagnosis: Difficulty coping  Frontal lobe and executive function deficit  Severe episode of recurrent major depressive disorder, without psychotic features [facility])    Problem List Patient Active Problem List   Diagnosis Date Noted   Exercise-induced asthma 07/21/2018   Exertional dyspnea 12/15/2017   Elevated troponin    Mild obstructive sleep apnea 02/14/2015   Snoring 11/14/2014   Inguinal adenopathy 10/31/2014   Positive D dimer 10/17/2014   Pedal edema 10/14/2014   Fatigue 10/14/2014   Morbid obesity ([place]) 08/07/2006   TOBACCO DEPENDENCE 08/07/2006   MIGRAINE, UNSPEC., W/O INTRACTABLE MIGRAINE 08/07/2006   RHINITIS, ALLERGIC 08/07/2006    12/19/2020  Ponciano Ort, MOT, OTR/L 12/19/2020, 4:29  PM  [place] [address] [place], [place], [address] Phone: [phone]   Fax:  [phone]  Name: [name] MRN: [id number] Date of Birth: Jan 02, 1976

## 2020-12-20 ENCOUNTER — Other Ambulatory Visit (HOSPITAL_COMMUNITY): Payer: Commercial Managed Care - PPO

## 2020-12-20 ENCOUNTER — Encounter (HOSPITAL_COMMUNITY): Payer: Self-pay | Admitting: Family

## 2020-12-20 ENCOUNTER — Other Ambulatory Visit: Payer: Self-pay

## 2020-12-20 ENCOUNTER — Encounter (HOSPITAL_COMMUNITY): Payer: Self-pay | Admitting: Gastroenterology

## 2020-12-20 ENCOUNTER — Other Ambulatory Visit (HOSPITAL_COMMUNITY): Payer: Medicaid Other

## 2020-12-21 ENCOUNTER — Other Ambulatory Visit (HOSPITAL_COMMUNITY): Payer: Medicaid Other | Admitting: Licensed Clinical Social Worker

## 2020-12-21 ENCOUNTER — Other Ambulatory Visit (HOSPITAL_COMMUNITY): Payer: Medicaid Other | Admitting: Occupational Therapy

## 2020-12-21 ENCOUNTER — Encounter (HOSPITAL_COMMUNITY): Payer: Self-pay

## 2020-12-21 DIAGNOSIS — F332 Major depressive disorder, recurrent severe without psychotic features: Secondary | ICD-10-CM

## 2020-12-21 DIAGNOSIS — R41844 Frontal lobe and executive function deficit: Secondary | ICD-10-CM

## 2020-12-21 DIAGNOSIS — R4589 Other symptoms and signs involving emotional state: Secondary | ICD-10-CM

## 2020-12-21 NOTE — Therapy (Signed)
[place], [place], [address] Phone: [phone]   Fax:  [phone] Virtual Visit via Video Note  I connected with [name] on 12/21/20 at  11:00 AM EDT by a video enabled telemedicine application and verified that I am speaking with the correct person using two identifiers.  Location: Patient: Patient Home Provider: Clinic Office   I discussed the limitations of evaluation and management by telemedicine and the availability of in person appointments. The patient expressed understanding and agreed to proceed.   I discussed the assessment and treatment plan with the patient. The patient was provided an opportunity to ask questions and all were answered. The patient agreed with the plan and demonstrated an understanding of the instructions.   The patient was advised to call back or seek an in-person evaluation if the symptoms worsen or if the condition fails to improve as anticipated.  I provided 60 minutes of non-face-to-face time during this encounter.   Ponciano Ort, OT   Occupational Therapy Treatment  Patient Details  Name: [name] MRN: [id number] Date of Birth: 23-Jul-1975 Referring Provider (OT): Ricky Ala   Encounter Date: 12/21/2020   OT End of Session - 12/21/20 1301     Visit Number 10    Number of Visits 20    Date for OT Re-Evaluation 12/29/20    Authorization Type [employer]    OT Start Time 1115    OT Stop Time 1215    OT Time Calculation (min) 60 min    Activity Tolerance Patient tolerated treatment well    Behavior During Therapy [facility] for tasks assessed/performed             Past Medical History:  Diagnosis Date   Cervical dysplasia 06/11/1991   Depression    Migraine    Obesity    Sleep apnea    Uses CPAP    Past Surgical History:  Procedure Laterality Date   CESAREAN SECTION     x 4   COLPOSCOPY     TUBAL LIGATION Bilateral 6962    UMBILICAL HERNIA REPAIR  2007    There were no vitals filed for this visit.   Subjective Assessment - 12/21/20 1301     Currently in Pain? No/denies             OT Education - 12/21/20 1301     Education Details Educated on fight-or-flight response and use of relaxation strategies including deep breathing, guided imagery, and PMR    Person(s) Educated Patient    Methods Explanation;Handout    Comprehension Verbalized understanding              OT Short Term Goals - 12/04/20 1339       OT SHORT TERM GOAL #1   Status On-going      OT SHORT TERM GOAL #2   Status On-going      OT SHORT TERM GOAL #3   Status On-going           Group Session:  S: "I like to take warm baths, and recently I started listening to audio books and following along with a book."  O:  Group began with a warm-up activity and group members were encouraged to share and identify ways in which they have practiced relaxation in the past, including any specific strategies or hobbies. Today's discussion focused on the topic of RELAXATION and patients reviewed and engaged in a variety of relaxation  strategies techniques including deep breathing, guided imagery/meditation, and progressive muscle relaxation. After each strategy was reviewed, group members were invited to engage in an active practice of relaxation strategies identified. Review and background on the fight-or-flight response was also provided.   A: [name] was active and independent in her participation of discussion and activity, sharing that currently, she likes to relax by taking warm baths and listening to audio books. Pt appeared attentive in listening to other activities provided by peers and strategies reviewed by clinician. Pt expressed benefit from deep breathing in the past and appeared open to discussion.   P: Continue to attend PHP OT group sessions 5x week for 1 weeks to promote daily structure, social engagement, and opportunities  to develop and utilize adaptive strategies to maximize functional performance in preparation for safe transition and integration back into school, work, and the community.   Plan - 12/21/20 1301     Occupational performance deficits (Please refer to evaluation for details): ADL's;IADL's;Rest and Sleep;Education;Work;Social Participation;Leisure    Body Structure / Function / Physical Skills ADL    Cognitive Skills Attention;Emotional;Energy/Drive;Learn;Memory;Perception;Problem Solve;Safety Awareness;Temperament/Personality;Thought;Understand    Psychosocial Skills Coping Strategies;Environmental  Adaptations;Habits;Interpersonal Interaction;Routines and Behaviors             Patient will benefit from skilled therapeutic intervention in order to improve the following deficits and impairments:   Body Structure / Function / Physical Skills: ADL Cognitive Skills: Attention, Emotional, Energy/Drive, Learn, Memory, Perception, Problem Solve, Safety Awareness, Temperament/Personality, Thought, Understand Psychosocial Skills: Coping Strategies, Environmental  Adaptations, Habits, Interpersonal Interaction, Routines and Behaviors   Visit Diagnosis: Difficulty coping  Frontal lobe and executive function deficit  Severe episode of recurrent major depressive disorder, without psychotic features [facility])    Problem List Patient Active Problem List   Diagnosis Date Noted   Exercise-induced asthma 07/21/2018   Exertional dyspnea 12/15/2017   Elevated troponin    Mild obstructive sleep apnea 02/14/2015   Snoring 11/14/2014   Inguinal adenopathy 10/31/2014   Positive D dimer 10/17/2014   Pedal edema 10/14/2014   Fatigue 10/14/2014   Morbid obesity ([place]) 08/07/2006   TOBACCO DEPENDENCE 08/07/2006   MIGRAINE, UNSPEC., W/O INTRACTABLE MIGRAINE 08/07/2006   RHINITIS, ALLERGIC 08/07/2006    12/21/2020  Ponciano Ort, MOT, OTR/L  12/21/2020, 1:02 PM  [place], [place], [address] Phone: [phone]   Fax:  [phone]  Name: [name] MRN: [id number] Date of Birth: 01-28-1976
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
## 2020-12-22 ENCOUNTER — Other Ambulatory Visit: Payer: Self-pay

## 2020-12-22 ENCOUNTER — Other Ambulatory Visit (HOSPITAL_COMMUNITY): Payer: Medicaid Other | Admitting: Licensed Clinical Social Worker

## 2020-12-22 DIAGNOSIS — F332 Major depressive disorder, recurrent severe without psychotic features: Secondary | ICD-10-CM

## 2020-12-26 ENCOUNTER — Ambulatory Visit (HOSPITAL_COMMUNITY): Payer: Medicaid Other | Admitting: Anesthesiology

## 2020-12-26 ENCOUNTER — Encounter (HOSPITAL_COMMUNITY)
Admission: RE | Disposition: A | Discharge status: Home / Self Care | Payer: Self-pay | Source: Home / Self Care | Attending: Gastroenterology

## 2020-12-26 ENCOUNTER — Other Ambulatory Visit: Payer: Self-pay

## 2020-12-26 ENCOUNTER — Encounter (HOSPITAL_COMMUNITY): Payer: Self-pay | Admitting: Gastroenterology

## 2020-12-26 ENCOUNTER — Ambulatory Visit (HOSPITAL_COMMUNITY)
Admission: RE | Admit: 2020-12-26 | Discharge: 2020-12-26 | Disposition: A | Discharge status: Home / Self Care | Payer: Medicaid Other | Attending: Gastroenterology | Admitting: Gastroenterology

## 2020-12-26 DIAGNOSIS — K640 First degree hemorrhoids: Secondary | ICD-10-CM | POA: Diagnosis not present

## 2020-12-26 DIAGNOSIS — Z79899 Other long term (current) drug therapy: Secondary | ICD-10-CM | POA: Diagnosis not present

## 2020-12-26 DIAGNOSIS — Z860100 Personal history of colon polyps, unspecified: Secondary | ICD-10-CM

## 2020-12-26 DIAGNOSIS — Z91041 Radiographic dye allergy status: Secondary | ICD-10-CM | POA: Diagnosis not present

## 2020-12-26 DIAGNOSIS — Z1211 Encounter for screening for malignant neoplasm of colon: Secondary | ICD-10-CM | POA: Diagnosis present

## 2020-12-26 DIAGNOSIS — Z8249 Family history of ischemic heart disease and other diseases of the circulatory system: Secondary | ICD-10-CM | POA: Insufficient documentation

## 2020-12-26 DIAGNOSIS — Z885 Allergy status to narcotic agent status: Secondary | ICD-10-CM | POA: Insufficient documentation

## 2020-12-26 DIAGNOSIS — Z8041 Family history of malignant neoplasm of ovary: Secondary | ICD-10-CM | POA: Diagnosis not present

## 2020-12-26 DIAGNOSIS — Z833 Family history of diabetes mellitus: Secondary | ICD-10-CM | POA: Insufficient documentation

## 2020-12-26 DIAGNOSIS — Z8049 Family history of malignant neoplasm of other genital organs: Secondary | ICD-10-CM | POA: Insufficient documentation

## 2020-12-26 DIAGNOSIS — Z7982 Long term (current) use of aspirin: Secondary | ICD-10-CM | POA: Insufficient documentation

## 2020-12-26 DIAGNOSIS — F17200 Nicotine dependence, unspecified, uncomplicated: Secondary | ICD-10-CM | POA: Diagnosis not present

## 2020-12-26 HISTORY — PX: COLONOSCOPY: SHX5424

## 2020-12-26 HISTORY — DX: Sleep apnea, unspecified: G47.30

## 2020-12-26 SURGERY — COLONOSCOPY
Anesthesia: Monitor Anesthesia Care

## 2020-12-26 MED ORDER — PROPOFOL 500 MG/50ML IV EMUL
INTRAVENOUS | Status: DC | PRN
  Administered 2020-12-26: 125 ug/kg/min via INTRAVENOUS

## 2020-12-26 MED ORDER — LACTATED RINGERS IV SOLN: INTRAVENOUS | Status: DC

## 2020-12-26 MED ORDER — SODIUM CHLORIDE 0.9 % IV SOLN
INTRAVENOUS | Status: DC
Start: 2020-12-26 — End: 2020-12-26

## 2020-12-26 MED ORDER — LIDOCAINE 2% (20 MG/ML) 5 ML SYRINGE
INTRAMUSCULAR | Status: DC | PRN
  Administered 2020-12-26: 100 mg via INTRAVENOUS

## 2020-12-26 MED ORDER — PROPOFOL 500 MG/50ML IV EMUL
INTRAVENOUS | Status: DC | PRN
  Administered 2020-12-26: 50 mg via INTRAVENOUS

## 2020-12-26 MED ORDER — ONDANSETRON HCL 4 MG/2ML IJ SOLN
INTRAMUSCULAR | Status: DC | PRN
Start: 2020-12-26 — End: 2020-12-26
  Administered 2020-12-26: 4 mg via INTRAVENOUS

## 2020-12-26 NOTE — H&P (Signed)
Date of Initial H&P: 12/18/20  History reviewed, patient examined, no change in status, stable for surgery.

## 2020-12-26 NOTE — Anesthesia Preprocedure Evaluation (Addendum)
Anesthesia Evaluation  Patient identified by MRN, date of birth, ID band Patient awake    Reviewed: Allergy & Precautions, NPO status , Patient's Chart, lab work & pertinent test results  History of Anesthesia Complications Negative for: history of anesthetic complications  Airway Mallampati: III  TM Distance: >3 FB Neck ROM: Full    Dental  (+) Dental Advisory Given, Chipped   Pulmonary asthma , sleep apnea and Continuous Positive Airway Pressure Ventilation , Current SmokerPatient did not abstain from smoking.,    Pulmonary exam normal        Cardiovascular negative cardio ROS Normal cardiovascular exam     Neuro/Psych  Headaches, PSYCHIATRIC DISORDERS Depression    GI/Hepatic negative GI ROS, Neg liver ROS,   Endo/Other  Morbid obesity  Renal/GU negative Renal ROS     Musculoskeletal negative musculoskeletal ROS (+)   Abdominal (+) + obese,   Peds  Hematology negative hematology ROS (+)   Anesthesia Other Findings   Reproductive/Obstetrics                            Anesthesia Physical Anesthesia Plan  ASA: 3  Anesthesia Plan: MAC   Post-op Pain Management:    Induction:   PONV Risk Score and Plan: 2 and Propofol infusion and Treatment may vary due to age or medical condition  Airway Management Planned: Nasal Cannula and Natural Airway  Additional Equipment: None  Intra-op Plan:   Post-operative Plan:   Informed Consent: I have reviewed the patients History and Physical, chart, labs and discussed the procedure including the risks, benefits and alternatives for the proposed anesthesia with the patient or authorized representative who has indicated his/her understanding and acceptance.       Plan Discussed with: CRNA and Anesthesiologist  Anesthesia Plan Comments:        Anesthesia Quick Evaluation

## 2020-12-26 NOTE — Transfer of Care (Signed)
Immediate Anesthesia Transfer of Care Note  Patient: [name]  Procedure(s) Performed: Procedure(s): COLONOSCOPY (N/A)  Patient Location: PACU  Anesthesia Type:MAC  Level of Consciousness:  sedated, patient cooperative and responds to stimulation  Airway & Oxygen Therapy:Patient Spontanous Breathing and Patient connected to face mask oxgen  Post-op Assessment:  Report given to PACU RN and Post -op Vital signs reviewed and stable  Post vital signs:  Reviewed and stable  Last Vitals:  Vitals:   12/26/20 1200 12/26/20 1210  BP: 135/66 131/85  Pulse: 89 84  Resp: 20 17  Temp:    SpO2: 32% 20%    Complications: No apparent anesthesia complications

## 2020-12-26 NOTE — Anesthesia Postprocedure Evaluation (Signed)
Anesthesia Post Note  Patient: [name]  Procedure(s) Performed: COLONOSCOPY     Patient location during evaluation: PACU Anesthesia Type: MAC Level of consciousness: awake and alert Pain management: pain level controlled Vital Signs Assessment: post-procedure vital signs reviewed and stable Respiratory status: spontaneous breathing, nonlabored ventilation and respiratory function stable Cardiovascular status: stable and blood pressure returned to baseline Anesthetic complications: no   No notable events documented.  Last Vitals:  Vitals:   12/26/20 1200 12/26/20 1210  BP: 135/66 131/85  Pulse: 89 84  Resp: 20 17  Temp:    SpO2: 96% 95%    Last Pain:  Vitals:   12/26/20 1210  TempSrc:   PainSc: 0-No pain                 Audry Pili

## 2020-12-26 NOTE — Discharge Instructions (Addendum)
Repeat colonoscopy in 5 years with 2 day prep. Our office will refer you to surgery for evaluation of your hemorrhoids and rectal pain.    YOU HAD AN ENDOSCOPIC PROCEDURE TODAY: Refer to the procedure report and other information in the discharge instructions given to you for any specific questions about what was found during the examination. If this information does not answer your questions, please call [facility] office at [phone] to clarify.   YOU SHOULD EXPECT: Some feelings of bloating in the abdomen. Passage of more gas than usual. Walking can help get rid of the air that was put into your GI tract during the procedure and reduce the bloating. If you had a lower endoscopy (such as a colonoscopy or flexible sigmoidoscopy) you may notice spotting of blood in your stool or on the toilet paper. Some abdominal soreness may be present for a day or two, also.  DIET: Your first meal following the procedure should be a light meal and then it is ok to progress to your normal diet. A half-sandwich or bowl of soup is an example of a good first meal. Heavy or fried foods are harder to digest and may make you feel nauseous or bloated. Drink plenty of fluids but you should avoid alcoholic beverages for 24 hours. If you had a esophageal dilation, please see attached instructions for diet.    ACTIVITY: Your care partner should take you home directly after the procedure. You should plan to take it easy, moving slowly for the rest of the day. You can resume normal activity the day after the procedure however YOU SHOULD NOT DRIVE, use power tools, machinery or perform tasks that involve climbing or major physical exertion for 24 hours (because of the sedation medicines used during the test).   SYMPTOMS TO REPORT IMMEDIATELY: A gastroenterologist can be reached at any hour. Please call [phone]  for any of the following symptoms:  Following lower endoscopy (colonoscopy, flexible sigmoidoscopy) Excessive  amounts of blood in the stool  Significant tenderness, worsening of abdominal pains  Swelling of the abdomen that is new, acute  Fever of 100 or higher  Following upper endoscopy (EGD, EUS, ERCP, esophageal dilation) Vomiting of blood or coffee ground material  New, significant abdominal pain  New, significant chest pain or pain under the shoulder blades  Painful or persistently difficult swallowing  New shortness of breath  Black, tarry-looking or red, bloody stools  FOLLOW UP:  If any biopsies were taken you will be contacted by phone or by letter within the next 1-3 weeks. Call [phone]  if you have not heard about the biopsies in 3 weeks.  Please also call with any specific questions about appointments or follow up tests. YOU HAD AN ENDOSCOPIC PROCEDURE TODAY: Refer to the procedure report and other information in the discharge instructions given to you for any specific questions about what was found during the examination. If this information does not answer your questions, please call [facility] office at [phone] to clarify.

## 2020-12-26 NOTE — Interval H&P Note (Signed)
History and Physical Interval Note:  12/26/2020 11:04 AM  [name]  has presented today for surgery, with the diagnosis of Rectal pain/History of colon polyps.  The various methods of treatment have been discussed with the patient and family. After consideration of risks, benefits and other options for treatment, the patient has consented to  Procedure(s): COLONOSCOPY (N/A) as a surgical intervention.  The patient's history has been reviewed, patient examined, no change in status, stable for surgery.  I have reviewed the patient's chart and labs.  Questions were answered to the patient's satisfaction.     Lear Ng

## 2020-12-26 NOTE — Op Note (Signed)
[facility] Patient Name: [facility] Procedure Date: 12/26/2020 MRN: [id number] Attending MD: Lear Ng , MD Date of Birth: August 06, 1975 [name]: [id number] Age: 45 Admit Type: Outpatient Procedure:                Colonoscopy Indications:              High risk colon cancer surveillance: Personal                            history of colonic polyps, Last colonoscopy: March                            2016 Providers:                Lear Ng, MD, Elmer Ramp. Tilden Dome, RN,                            Laverda Sorenson, Technician, Arnoldo Hooker, CRNA Referring MD:             Kerin Perna Medicines:                Propofol per Anesthesia, Monitored Anesthesia Care Complications:            No immediate complications. Estimated Blood Loss:     Estimated blood loss: none. Procedure:                Pre-Anesthesia Assessment:                           - Prior to the procedure, a History and Physical                            was performed, and patient medications and                            allergies were reviewed. The patient's tolerance of                            previous anesthesia was also reviewed. The risks                            and benefits of the procedure and the sedation                            options and risks were discussed with the patient.                            All questions were answered, and informed consent                            was obtained. Prior Anticoagulants: The patient has                            taken no previous anticoagulant or antiplatelet  agents. ASA Grade Assessment: III - A patient with                            severe systemic disease. After reviewing the risks                            and benefits, the patient was deemed in                            satisfactory condition to undergo the procedure.                           After obtaining informed consent, the colonoscope                             was passed under direct vision. Throughout the                            procedure, the patient's blood pressure, pulse, and                            oxygen saturations were monitored continuously. The                            [id number]-[id number] ([id number] Olympus pediatric colonscope                            was introduced through the anus and advanced to the                            the cecum, identified by appendiceal orifice and                            ileocecal valve. The colonoscopy was performed with                            difficulty due to significant looping and fair                            prep. Successful completion of the procedure was                            aided by straightening and shortening the scope to                            obtain bowel loop reduction and lavage. The patient                            tolerated the procedure well. The quality of the                            bowel preparation was fair. The ileocecal valve,  appendiceal orifice, and rectum were photographed. Scope In: 11:22:11 AM Scope Out: 11:33:57 AM Scope Withdrawal Time: 0 hours 7 minutes 50 seconds  Total Procedure Duration: 0 hours 11 minutes 46 seconds  Findings:      The perianal and digital rectal examinations were normal.      Internal hemorrhoids were found during retroflexion. The hemorrhoids       were medium-sized and Grade I (internal hemorrhoids that do not       prolapse).      The exam was otherwise normal throughout the examined colon.      A large amount of semi-liquid semi-solid stool was found in the entire       colon, interfering with visualization. Impression:               - Preparation of the colon was fair.                           - Internal hemorrhoids.                           - Stool in the entire examined colon.                           - No specimens collected. Moderate Sedation:      Not  Applicable - Patient had care per Anesthesia. Recommendation:           - Patient has a contact number available for                            emergencies. The signs and symptoms of potential                            delayed complications were discussed with the                            patient. Return to normal activities tomorrow.                            Written discharge instructions were provided to the                            patient.                           - High fiber diet.                           - Repeat colonoscopy in 5 years for surveillance.                           - Two day prep for f/u colonoscopy. Refer to                            surgery for therapeutic maneuvers for hemorrhoids                            if topical therapy continues to  not adequately work. Procedure Code(s):        --- Professional ---                           [id number], Colonoscopy, flexible; diagnostic, including                            collection of specimen(s) by brushing or washing,                            when performed (separate procedure) Diagnosis Code(s):        --- Professional ---                           Z86.010, Personal history of colonic polyps                           K64.0, First degree hemorrhoids CPT copyright 2019 [employer]. All rights reserved. The codes documented in this report are preliminary and upon coder review may  be revised to meet current compliance requirements. Lear Ng, MD 12/26/2020 11:46:35 AM This report has been signed electronically. Number of Addenda: 0

## 2020-12-27 ENCOUNTER — Other Ambulatory Visit (HOSPITAL_COMMUNITY): Payer: Medicaid Other | Admitting: Licensed Clinical Social Worker

## 2020-12-27 ENCOUNTER — Other Ambulatory Visit (HOSPITAL_COMMUNITY): Payer: Medicaid Other

## 2020-12-27 DIAGNOSIS — F332 Major depressive disorder, recurrent severe without psychotic features: Secondary | ICD-10-CM

## 2020-12-27 MED ORDER — SERTRALINE HCL 25 MG PO TABS: 75.0000 mg | ORAL_TABLET | Freq: Every day | ORAL | 2 refills | Status: AC

## 2020-12-27 NOTE — Addendum Note (Signed)
Addended by: Derrill Center on: 12/27/2020 01:26 PM   Modules accepted: Orders

## 2020-12-27 NOTE — Progress Notes (Signed)
[place] MD/PA/NP OP Progress Note  12/27/2020 1:22 PM [name]  MRN:  [id number]  Chief Complaint:   [name] stated "Everything is overwhelming"  [name] was seen and evaluated via WebEx.  She presents slightly depressed during my initial assessment.  Denying suicidal or homicidal ideations.  Denies auditory or visual hallucinations.  She reports symptoms of worry related to being written out of work since May 21/2022.  She reports initially taken herself out of work after speaking with employee assistance program (EAP) Larkin Ina.  As she states crippling depression decline in ADLs and passive suicidal ideations at that time.  States she was not under any providers care.  Patient was then referred to [facility] where she was seen and evaluated by this provider. [name] was recommended to start partial hospitalization programming (PHP).  Advised patient that this provider can only provide documentation from the time that she has attended and been approved by insurance for PHP.  Discussed patient to follow-up with primary care provider and or EAP for additional backdated coverage.  Patient was receptive to plan.  She states that she would probably need to return back to work soon she has financial concerns. " I don' t think going to IOP would be good at this time."  NP was receptive to plan.  11:15-Therapist reports patient was expressing suicidal ideations during group session due to transitioning to intensive outpatient programming and stated that she was struggling with multiple stressors.  States her father's birthday is tomorrow January [birth year], 2023 ( who is deceased) she feels overwhelmed and like a burden to her family.  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  Discussed patient to follow-up for inpatient  admission and potential involuntary commitment.  Patient then adamant denied making statements related to suicide.   [name] stated " I just  have a lot going on, I have not been sleeping well I recently had a colonoscopy, and I do not think this primary care provider will write me out because I will be new to the practice. " States" My mood is all over the place I have ups and downs."  However,  I know my children need me, especially my nephew. " I know suicide is a selfish reaction."  patient is adamantly denying plan or intent.  Reports " we don't have gas/ electric  so my family has to take cold showers.  As a mom not being able to provide for your family is stressful."   States she has plans to follow-up with her employer today.  Discussed taking hydroxyzine 3 times daily as needed.  We will titrate Zoloft from 50 mg to 75 mg daily.  Patient was receptive to plan.  Will consider initiating Abilify however patient declined at this time. "  You know how I feel about medications."  Discussed initiating a mood stabilization medication.  This provider has followed up with patient twice during group session today.  For safety planning.  We will continue to monitor for safety.  Support, encouragement reassurance was provided.    Visit Diagnosis:    ICD-10-CM   1. MDD (major depressive disorder), recurrent severe, without psychosis ([place])  F33.2     2. Severe episode of recurrent major depressive  disorder, without psychotic features ([place])  F33.2       Past Psychiatric History:   Past Medical History:  Past Medical History:  Diagnosis Date   Cervical dysplasia 06/11/1991   Depression    Migraine    Obesity    Sleep apnea    Uses CPAP    Past Surgical History:  Procedure Laterality Date   CESAREAN SECTION     x 4   COLONOSCOPY N/A 12/26/2020   Procedure: COLONOSCOPY;  Surgeon: Wilford Corner, MD;  Location: [facility];  Service: Endoscopy;  Laterality: N/A;   COLPOSCOPY     TUBAL  LIGATION Bilateral 2694   UMBILICAL HERNIA REPAIR  2007    Family Psychiatric History:   Family History:  Family History  Problem Relation Age of Onset   Ovarian cancer Mother 12   Heart failure Mother    Depression Mother    Cancer Mother        uterine cancer    Heart failure Father 46       MI   Diabetes Father        type 2   Hypertension Father    Heart disease Sister     Social History:  Social History   Socioeconomic History   Marital status: Single    Spouse name: Not on file   Number of children: 4   Years of education: Not on file   Highest education level: Not on file  Occupational History    Employer: [employer]   Tobacco Use   Smoking status: Every Day    Packs/day: 0.50    Types: Cigarettes   Smokeless tobacco: Never  Substance and Sexual Activity   Alcohol use: No    Alcohol/week: 0.0 standard drinks   Drug use: No   Sexual activity: Yes    Birth control/protection: None, Surgical  Other Topics Concern   Not on file  Social History Narrative   Not on file   Social Determinants of Health   Financial Resource Strain: Not on file  Food Insecurity: Not on file  Transportation Needs: Not on file  Physical Activity: Not on file  Stress: Not on file  Social Connections: Not on file    Allergies:  Allergies  Allergen Reactions   Iodinated Diagnostic Agents Other (See Comments) and Nausea And Vomiting    Pt became hypotensive,lightheaded,near syncopal the last 2 times she had contrast( prior to 2007)////////NEW UPDATE, COUGHING, TROUBLE BREATHING AND HIVES PER PT//A.CALHOUN   Morphine And Related Itching    Metabolic Disorder Labs: Lab Results  Component Value Date   HGBA1C 5.0 01/09/2018   MPG 103 05/19/2014   No results found for: PROLACTIN Lab Results  Component Value Date   CHOL 179 06/24/2018   TRIG 147 06/24/2018   HDL 58 06/24/2018   CHOLHDL 3.1 06/24/2018   VLDL 26 05/19/2014   LDLCALC 92 06/24/2018   LDLCALC 115 (H)  05/19/2014   Lab Results  Component Value Date   TSH 1.960 06/24/2018   TSH 2.649 12/15/2017    Therapeutic Level Labs: No results found for: LITHIUM No results found for: VALPROATE No components found for:  CBMZ  Current Medications: Current Outpatient Medications  Medication Sig Dispense Refill   acetaminophen (TYLENOL) 650 MG CR tablet Take 1,300 mg by mouth every 8 (eight) hours as needed for pain.     Cholecalciferol (VITAMIN D) 50 MCG (2000 UT) tablet Take 2,000 Units by mouth every Sunday.     hydrOXYzine (VISTARIL)  25 MG capsule Take 1 capsule (25 mg total) by mouth 3 (three) times daily as needed. (Patient taking differently: Take 25 mg by mouth at bedtime as needed (sleep).) 30 capsule 0   sertraline (ZOLOFT) 50 MG tablet Take 1 tablet (50 mg total) by mouth daily. 30 tablet 2   traZODone (DESYREL) 50 MG tablet Take 50 mg by mouth at bedtime as needed for sleep.     No current facility-administered medications for this visit.     Musculoskeletal: Strength & Muscle Tone: within normal limits Gait & Station: normal Patient leans: N/A  Psychiatric Specialty Exam: Review of Systems  There were no vitals taken for this visit.There is no height or weight on file to calculate BMI.  General Appearance: Casual  Eye Contact:  Good  Speech:  Clear and Coherent  Volume:  Normal  Mood:  Anxious, Depressed, and Hopeless  Affect:  Labile  Thought Process:  Coherent  Orientation:  Full (Time, Place, and Person)  Thought Content: Logical   Suicidal Thoughts:  Yes.  without intent/plan  Homicidal Thoughts:  No  Memory:  Immediate;   Good Recent;   Good  Judgement:  Fair  Insight:  Fair  Psychomotor Activity:  Normal  Concentration:  Concentration: Fair  Recall:  Good  Fund of Knowledge: Fair  Language: Fair  Akathisia:  No  Handed:  Right  AIMS (if indicated): done  Assets:  Communication Skills Resilience Social Support  ADL's:  Intact  Cognition: WNL  Sleep:   Fair   Screenings: GAD-7    [place] Office Visit from 10/06/2019 in [place] Office Visit from 09/21/2019 in [place] Video Visit from 01/12/2019 in [place] Office Visit from 08/18/2018 in [place] Office Visit from 07/21/2018 in [place]  Total GAD-7 Score 6 18 0 9 7      PHQ2-9    Flowsheet Row Counselor from 12/04/2020 in [place] Office Visit from 10/06/2019 in [place] Office Visit from 09/21/2019 in [place] Video Visit from 01/12/2019 in [place] Office Visit from 08/18/2018 in [place]  PHQ-2 Total Score 6 2 1  0 2  PHQ-9 Total Score 17 10 -- -- 8      Flowsheet Row Admission (Discharged) from 12/26/2020 in [place] from 12/04/2020 in [place] ED from 06/28/2020 in [facility] [place] Error: Q3, 4, or 5 should not be populated when Q2 is No No Risk        Assessment and Plan:  Continue partial hospitalization programming Consider stepdown to intensive outpatient programming pending insurance approval Discussed titrating Zoloft 50 mg to 75 mg p.o. daily Consider initiating Abilify 5 mg p.o. daily-declined at this time Continue hydroxyzine 25 mg p.o. 3 times daily  Patient provided with crisis hotline information and consider follow-up with [facility] urgent care  Treatment plan was reviewed and agreed upon by NPT Harwood Nall and patient [facility] need for group services     Derrill [place], NP 12/27/2020, 1:22 PM

## 2020-12-28 ENCOUNTER — Other Ambulatory Visit: Payer: Self-pay

## 2020-12-28 ENCOUNTER — Other Ambulatory Visit (HOSPITAL_COMMUNITY): Payer: Medicaid Other | Admitting: Occupational Therapy

## 2020-12-28 ENCOUNTER — Encounter (HOSPITAL_COMMUNITY): Payer: Self-pay

## 2020-12-28 ENCOUNTER — Other Ambulatory Visit (HOSPITAL_COMMUNITY): Payer: Medicaid Other | Admitting: Licensed Clinical Social Worker

## 2020-12-28 DIAGNOSIS — F332 Major depressive disorder, recurrent severe without psychotic features: Secondary | ICD-10-CM

## 2020-12-28 DIAGNOSIS — R41844 Frontal lobe and executive function deficit: Secondary | ICD-10-CM

## 2020-12-28 DIAGNOSIS — R4589 Other symptoms and signs involving emotional state: Secondary | ICD-10-CM

## 2020-12-28 NOTE — Therapy (Signed)
[place], [place], [address] Phone: [phone]   Fax:  [phone] Virtual Visit via Video Note  I connected with [name] on 12/28/20 at 11:00 AM EDT by a video enabled telemedicine application and verified that I am speaking with the correct person using two identifiers.  Location: Patient: Patient Home Provider: Clinic Office   I discussed the limitations of evaluation and management by telemedicine and the availability of in person appointments. The patient expressed understanding and agreed to proceed.   I discussed the assessment and treatment plan with the patient. The patient was provided an opportunity to ask questions and all were answered. The patient agreed with the plan and demonstrated an understanding of the instructions.   The patient was advised to call back or seek an in-person evaluation if the symptoms worsen or if the condition fails to improve as anticipated.  I provided 60 minutes of non-face-to-face time during this encounter.   Ponciano Ort, OT   Occupational Therapy Treatment  Patient Details  Name: [name] MRN: [id number] Date of Birth: Jan 25, 1976 Referring Provider (OT): Ricky Ala   Encounter Date: 12/28/2020   OT End of Session - 12/28/20 1250     Visit Number 11    Number of Visits 20    Date for OT Re-Evaluation 12/29/20    Authorization Type [employer]    OT Start Time 1100    OT Stop Time 1200    OT Time Calculation (min) 60 min    Activity Tolerance Patient tolerated treatment well    Behavior During Therapy [facility] for tasks assessed/performed             Past Medical History:  Diagnosis Date   Cervical dysplasia 06/11/1991   Depression    Migraine    Obesity    Sleep apnea    Uses CPAP    Past Surgical History:  Procedure Laterality Date   CESAREAN SECTION     x 4   COLONOSCOPY N/A 12/26/2020   Procedure:  COLONOSCOPY;  Surgeon: Wilford Corner, MD;  Location: [facility];  Service: Endoscopy;  Laterality: N/A;   COLPOSCOPY     TUBAL LIGATION Bilateral 9937   UMBILICAL HERNIA REPAIR  2007    There were no vitals filed for this visit.   Subjective Assessment - 12/28/20 1250     Currently in Pain? No/denies             OT Education - 12/28/20 1250     Education Details Educated on low vs positive self-esteem and reviewed six strategies to improve low self-esteem    Person(s) Educated Patient    Methods Explanation;Handout    Comprehension Verbalized understanding              OT Short Term Goals - 12/04/20 1339       OT SHORT TERM GOAL #1   Status On-going      OT SHORT TERM GOAL #2   Status On-going      OT SHORT TERM GOAL #3   Status On-going           Group Session:  S: "I have low self-esteem and I struggle when people give me a compliment or something because it has me questioning if they are really genuine or not. Because I don't believe what they are saying."  O: Today's group session encouraged increased engagement and participation through discussion focused on self-esteem. Topic  of discussion focused on identifying differences between low vs high self-esteem and identified ways in which our self-esteem impacts our daily lives including: self-criticism, ignoring positive qualities, negative emotions, work/school performance, relationships, leisure engagement, and self-care. Group members further identified ways in which their self-esteem directly impacts their daily function. Tips and strategies were shared to boost and build-up our low self-esteem, including exercise, mindfulness/meditation, postures of confidence, surrounding yourself with positive people, positive affirmations, and self-care. Group members committed to trying one of the above-mentioned strategies over their weekend.    A: [name] was active and independent in her participation of discussion  and shared that she has low self-esteem and struggles often with feelings of low self worth and value. She shared that she also struggles to accept compliments and often does not believe the people when they say something nice. Pt receptive to strategies reviewed and shared a positive affirmation that stood out to her "I am in charge of my life." Pt also shared three positive ways to describe herself "passionate, trustworthy, and intelligent."  P: Continue to attend PHP OT group sessions 5x week for 1 weeks to promote daily structure, social engagement, and opportunities to develop and utilize adaptive strategies to maximize functional performance in preparation for safe transition and integration back into school, work, and the community. Plan to address topic of strengths exploration in next OT group session.   Plan - 12/28/20 1250     Occupational performance deficits (Please refer to evaluation for details): ADL's;IADL's;Rest and Sleep;Education;Work;Social Participation;Leisure    Body Structure / Function / Physical Skills ADL    Cognitive Skills Attention;Emotional;Energy/Drive;Learn;Memory;Perception;Problem Solve;Safety Awareness;Temperament/Personality;Thought;Understand    Psychosocial Skills Coping Strategies;Environmental  Adaptations;Habits;Interpersonal Interaction;Routines and Behaviors             Patient will benefit from skilled therapeutic intervention in order to improve the following deficits and impairments:   Body Structure / Function / Physical Skills: ADL Cognitive Skills: Attention, Emotional, Energy/Drive, Learn, Memory, Perception, Problem Solve, Safety Awareness, Temperament/Personality, Thought, Understand Psychosocial Skills: Coping Strategies, Environmental  Adaptations, Habits, Interpersonal Interaction, Routines and Behaviors   Visit Diagnosis: Difficulty coping  Frontal lobe and executive function deficit  Severe episode of recurrent major depressive  disorder, without psychotic features [facility])    Problem List Patient Active Problem List   Diagnosis Date Noted   Personal history of colonic polyps 12/26/2020   Exercise-induced asthma 07/21/2018   Exertional dyspnea 12/15/2017   Elevated troponin    Mild obstructive sleep apnea 02/14/2015   Snoring 11/14/2014   Inguinal adenopathy 10/31/2014   Positive D dimer 10/17/2014   Pedal edema 10/14/2014   Fatigue 10/14/2014   Morbid obesity ([place]) 08/07/2006   TOBACCO DEPENDENCE 08/07/2006   MIGRAINE, UNSPEC., W/O INTRACTABLE MIGRAINE 08/07/2006   RHINITIS, ALLERGIC 08/07/2006    12/28/2020  Ponciano Ort, MOT, OTR/L  12/28/2020, 12:51 PM  [place], [place], [address] Phone: [phone]   Fax:  [phone]  Name: [name] MRN: [id number] Date of Birth: 01-05-1976

## 2020-12-29 ENCOUNTER — Telehealth (HOSPITAL_COMMUNITY): Payer: Self-pay | Admitting: Licensed Clinical Social Worker

## 2020-12-29 ENCOUNTER — Ambulatory Visit (HOSPITAL_COMMUNITY): Payer: Commercial Managed Care - PPO

## 2020-12-29 ENCOUNTER — Other Ambulatory Visit (HOSPITAL_COMMUNITY): Payer: Commercial Managed Care - PPO

## 2020-12-29 MED ORDER — TRAZODONE HCL 50 MG PO TABS: 50.0000 mg | ORAL_TABLET | Freq: Every evening | ORAL | 0 refills | Status: AC | PRN

## 2020-12-29 MED ORDER — HYDROXYZINE PAMOATE 25 MG PO CAPS: 25.0000 mg | ORAL_CAPSULE | Freq: Three times a day (TID) | ORAL | 0 refills | Status: AC | PRN

## 2021-01-01 ENCOUNTER — Other Ambulatory Visit (HOSPITAL_COMMUNITY): Payer: Medicaid Other

## 2021-01-01 ENCOUNTER — Telehealth (HOSPITAL_COMMUNITY): Payer: Self-pay | Admitting: Licensed Clinical Social Worker

## 2021-01-01 ENCOUNTER — Other Ambulatory Visit: Payer: Self-pay

## 2021-01-01 NOTE — Telephone Encounter (Signed)
Opened in error

## 2021-01-02 ENCOUNTER — Ambulatory Visit
Admission: EM | Admit: 2021-01-02 | Discharge: 2021-01-02 | Disposition: A | Discharge status: Home / Self Care | Payer: Commercial Managed Care - PPO | Attending: Student | Admitting: Student

## 2021-01-02 ENCOUNTER — Other Ambulatory Visit: Payer: Self-pay

## 2021-01-02 ENCOUNTER — Telehealth (HOSPITAL_COMMUNITY): Payer: Self-pay | Admitting: Licensed Clinical Social Worker

## 2021-01-02 ENCOUNTER — Encounter: Payer: Self-pay | Admitting: Emergency Medicine

## 2021-01-02 ENCOUNTER — Other Ambulatory Visit (HOSPITAL_COMMUNITY): Payer: Commercial Managed Care - PPO

## 2021-01-02 ENCOUNTER — Ambulatory Visit (HOSPITAL_COMMUNITY): Payer: Commercial Managed Care - PPO

## 2021-01-02 DIAGNOSIS — Z20822 Contact with and (suspected) exposure to covid-19: Secondary | ICD-10-CM | POA: Diagnosis not present

## 2021-01-02 DIAGNOSIS — J069 Acute upper respiratory infection, unspecified: Secondary | ICD-10-CM | POA: Diagnosis not present

## 2021-01-02 DIAGNOSIS — R110 Nausea: Secondary | ICD-10-CM

## 2021-01-02 MED ORDER — PROMETHAZINE-DM 6.25-15 MG/5ML PO SYRP: 5.0000 mL | ORAL_SOLUTION | Freq: Four times a day (QID) | ORAL | 0 refills | Status: AC | PRN

## 2021-01-02 MED ORDER — ONDANSETRON 8 MG PO TBDP: 8.0000 mg | ORAL_TABLET | Freq: Three times a day (TID) | ORAL | 0 refills | Status: AC | PRN

## 2021-01-02 NOTE — Telephone Encounter (Signed)
Email from pt: "Hello [name] currently I'm at the hospital due my household has contracted Covid sometime over the weekend and unfortunately my phone is disconnected." Cln did attempt to call pt by phone approximately 12:30p and phone remains disconnected. Cln responded by secure email informing pt PHP discharge for today will proceed as planned and to reach out if pt needs assistance.

## 2021-01-02 NOTE — ED Provider Notes (Signed)
[name]    [name]: [id number] Arrival date & time: 01/02/21  0810      History   Chief Complaint Chief Complaint  Patient presents with   Generalized Body Aches   Nasal Congestion    HPI [name] is a 45 y.o. female presenting with viral symptoms. Generalized body aches and nasal congestion for 3 days.  Nausea without vomiting. Throbbing headache behind forehead. Denies fevers/chills, v/d, shortness of breath, chest pain,  facial pain, teeth pain, sore throat, loss of taste/smell, swollen lymph nodes, ear pain. Medical history migraines, obesity, sleep apnea, depression, exercise-induced asthma controlled on no inhalers.  HPI  Past Medical History:  Diagnosis Date   Cervical dysplasia 06/11/1991   Depression    Migraine    Obesity    Sleep apnea    Uses CPAP    Patient Active Problem List   Diagnosis Date Noted   Personal history of colonic polyps 12/26/2020   Exercise-induced asthma 07/21/2018   Exertional dyspnea 12/15/2017   Elevated troponin    Mild obstructive sleep apnea 02/14/2015   Snoring 11/14/2014   Inguinal adenopathy 10/31/2014   Positive D dimer 10/17/2014   Pedal edema 10/14/2014   Fatigue 10/14/2014   Morbid obesity ([place]) 08/07/2006   TOBACCO DEPENDENCE 08/07/2006   MIGRAINE, UNSPEC., W/O INTRACTABLE MIGRAINE 08/07/2006   RHINITIS, ALLERGIC 08/07/2006    Past Surgical History:  Procedure Laterality Date   CESAREAN SECTION     x 4   COLONOSCOPY N/A 12/26/2020   Procedure: COLONOSCOPY;  Surgeon: Wilford Corner, MD;  Location: [facility];  Service: Endoscopy;  Laterality: N/A;   COLPOSCOPY     TUBAL LIGATION Bilateral [id number]   UMBILICAL HERNIA REPAIR  2007    OB History     Gravida  6   Para  4   Term  4   Preterm      AB  2   Living  4      SAB  1   IAB  1   Ectopic      Multiple      Live Births  4            Home Medications    Prior to Admission medications   Medication Sig Start  Date End Date Taking? Authorizing Provider  ondansetron (ZOFRAN ODT) 8 MG disintegrating tablet Take 1 tablet (8 mg total) by mouth every 8 (eight) hours as needed for nausea or vomiting. 01/02/21  Yes Hazel Sams, PA-C  promethazine-dextromethorphan (PROMETHAZINE-DM) 6.25-15 MG/5ML syrup Take 5 mLs by mouth 4 (four) times daily as needed for cough. 01/02/21  Yes Hazel Sams, PA-C  acetaminophen (TYLENOL) 650 MG CR tablet Take 1,300 mg by mouth every 8 (eight) hours as needed for pain.    [provider]  Cholecalciferol (VITAMIN D) 50 MCG (2000 UT) tablet Take 2,000 Units by mouth every Sunday.    [provider]  hydrOXYzine (VISTARIL) 25 MG capsule Take 1 capsule (25 mg total) by mouth 3 (three) times daily as needed. 12/29/20   Derrill Center, NP  sertraline (ZOLOFT) 25 MG tablet Take 3 tablets (75 mg total) by mouth daily. 12/27/20 12/27/21  Derrill Center, NP  traZODone (DESYREL) 50 MG tablet Take 1 tablet (50 mg total) by mouth at bedtime as needed for sleep. 12/29/20   Derrill Center, NP    Family History Family History  Problem Relation Age of Onset   Ovarian cancer Mother 74  Heart failure Mother    Depression Mother    Cancer Mother        uterine cancer    Heart failure Father 35       MI   Diabetes Father        type 2   Hypertension Father    Heart disease Sister     Social History Social History   Tobacco Use   Smoking status: Every Day    Packs/day: 0.50    Types: Cigarettes   Smokeless tobacco: Never  Substance Use Topics   Alcohol use: No    Alcohol/week: 0.0 standard drinks   Drug use: No     Allergies   Iodinated diagnostic agents and Morphine and related   Review of Systems Review of Systems  Constitutional:  Negative for appetite change, chills and fever.  HENT:  Positive for congestion. Negative for ear pain, rhinorrhea, sinus pressure, sinus pain and sore throat.   Eyes:  Negative for redness and visual disturbance.   Respiratory:  Positive for cough. Negative for chest tightness, shortness of breath and wheezing.   Cardiovascular:  Negative for chest pain and palpitations.  Gastrointestinal:  Positive for nausea. Negative for abdominal pain, constipation, diarrhea and vomiting.  Genitourinary:  Negative for dysuria, frequency and urgency.  Musculoskeletal:  Positive for myalgias.  Neurological:  Positive for headaches. Negative for dizziness and weakness.  Psychiatric/Behavioral:  Negative for confusion.   All other systems reviewed and are negative.   Physical Exam Triage Vital Signs ED Triage Vitals [01/02/21 0849]  Enc Vitals Group     BP (!) 165/100     Pulse Rate 96     Resp 18     Temp 98.2 F (36.8 C)     Temp Source Oral     SpO2 98 %     Weight      Height      Head Circumference      Peak Flow      Pain Score 7     Pain Loc      Pain Edu?      Excl. in [place]?    No data found.  Updated Vital Signs BP (!) 165/100 (BP Location: Left Arm)   Pulse 96   Temp 98.2 F (36.8 C) (Oral)   Resp 18   SpO2 98%   Visual Acuity Right Eye Distance:   Left Eye Distance:   Bilateral Distance:    Right Eye Near:   Left Eye Near:    Bilateral Near:     Physical Exam Vitals reviewed.  Constitutional:      General: She is not in acute distress.    Appearance: Normal appearance. She is obese. She is not ill-appearing.  HENT:     Head: Normocephalic and atraumatic.     Right Ear: Tympanic membrane, ear canal and external ear normal. No tenderness. No middle ear effusion. There is no impacted cerumen. Tympanic membrane is not perforated, erythematous, retracted or bulging.     Left Ear: Tympanic membrane, ear canal and external ear normal. No tenderness.  No middle ear effusion. There is no impacted cerumen. Tympanic membrane is not perforated, erythematous, retracted or bulging.     Nose: Nose normal. No congestion.     Mouth/Throat:     Mouth: Mucous membranes are moist.      Pharynx: Uvula midline. No oropharyngeal exudate or posterior oropharyngeal erythema.  Eyes:     Extraocular Movements: Extraocular movements intact.  Pupils: Pupils are equal, round, and reactive to light.  Cardiovascular:     Rate and Rhythm: Normal rate and regular rhythm.     Heart sounds: Normal heart sounds.  Pulmonary:     Effort: Pulmonary effort is normal.     Breath sounds: Normal breath sounds. No decreased breath sounds, wheezing, rhonchi or rales.  Abdominal:     Palpations: Abdomen is soft.     Tenderness: There is no abdominal tenderness. There is no guarding or rebound.  Neurological:     General: No focal deficit present.     Mental Status: She is alert and oriented to person, place, and time.  Psychiatric:        Mood and Affect: Mood normal.        Behavior: Behavior normal.        Thought Content: Thought content normal.        Judgment: Judgment normal.     UC Treatments / Results  Labs (all labs ordered are listed, but only abnormal results are displayed) Labs Reviewed  NOVEL CORONAVIRUS, NAA    EKG   Radiology No results found.  Procedures Procedures (including critical care time)  Medications Ordered in UC Medications - No data to display  Initial Impression / Assessment and Plan / UC Course  I have reviewed the triage vital signs and the nursing notes.  Pertinent labs & imaging results that were available during my care of the patient were reviewed by me and considered in my medical decision making (see chart for details).     This patient is a very pleasant 45 y.o. year old female presenting with viral URI. Today this pt is afebrile nontachycardic nontachypneic, oxygenating well on room air, no wheezes rhonchi or rales. Exercise-induced asthma is well-controlled on no medications; she declines albuterol inhaler today.  Covid PCR sent.  Promethazine, zofran.  States she is not pregnant.   ED return precautions discussed. Patient  verbalizes understanding and agreement.   Final Clinical Impressions(s) / UC Diagnoses   Final diagnoses:  Encounter for screening laboratory testing for COVID-19 virus  Viral URI with cough  Nausea without vomiting     Discharge Instructions      -Promethazine DM cough syrup for congestion/cough. This could make you drowsy, so take at night before bed. -Take the Zofran (ondansetron) up to 3 times daily for nausea and vomiting. Dissolve one pill under your tongue or between your teeth and your cheek. -For fevers/chills, bodyaches, headaches- Take Tylenol 1000 mg 3 times daily, and ibuprofen 800 mg 3 times daily with food.  You can take these together, or alternate every 3-4 hours. -Drink plenty of fluids and get plenty of rest -With a virus, you're typically contagious for 5-7 days, or as long as you're having fevers.       ED Prescriptions     Medication Sig Dispense Auth. Provider   promethazine-dextromethorphan (PROMETHAZINE-DM) 6.25-15 MG/5ML syrup Take 5 mLs by mouth 4 (four) times daily as needed for cough. 118 mL Marin Roberts E, PA-C   ondansetron (ZOFRAN ODT) 8 MG disintegrating tablet Take 1 tablet (8 mg total) by mouth every 8 (eight) hours as needed for nausea or vomiting. 20 tablet Hazel Sams, PA-C      PDMP not reviewed this encounter.   Hazel Sams, PA-C 01/02/21 [id number]

## 2021-01-02 NOTE — Discharge Instructions (Addendum)
-  Promethazine DM cough syrup for congestion/cough. This could make you drowsy, so take at night before bed. -Take the Zofran (ondansetron) up to 3 times daily for nausea and vomiting. Dissolve one pill under your tongue or between your teeth and your cheek. -For fevers/chills, bodyaches, headaches- Take Tylenol 1000 mg 3 times daily, and ibuprofen 800 mg 3 times daily with food.  You can take these together, or alternate every 3-4 hours. -Drink plenty of fluids and get plenty of rest -With a virus, you're typically contagious for 5-7 days, or as long as you're having fevers.

## 2021-01-02 NOTE — ED Triage Notes (Signed)
Pt here for body aches and nasal congestion x 3 days

## 2021-01-02 NOTE — Psych (Signed)
Virtual Visit via Video Note  I connected with [name] on 12/04/20 at  9:00 AM EDT by a video enabled telemedicine application and verified that I am speaking with the correct person using two identifiers.  Location: Patient: patient home Provider: clinical home office   I discussed the limitations of evaluation and management by telemedicine and the availability of in person appointments. The patient expressed understanding and agreed to proceed.  I discussed the assessment and treatment plan with the patient. The patient was provided an opportunity to ask questions and all were answered. The patient agreed with the plan and demonstrated an understanding of the instructions.   The patient was advised to call back or seek an in-person evaluation if the symptoms worsen or if the condition fails to improve as anticipated.  Pt was provided 240 minutes of non-face-to-face time during this encounter.   Lorin Glass, LCSW    [facility] PHP THERAPIST PROGRESS NOTE  IONIA MLECZKO [id number]  Session Time: 9:00 - 10:00  Participation Level: Active  Behavioral Response: CasualAlertDepressed  Type of Therapy: Group Therapy  Treatment Goals addressed: Coping  Interventions: CBT, DBT, Supportive, and Reframing  Summary: Clinician led check-in regarding current stressors and situation. Clinician utilized active listening and empathetic response and validated patient emotions. Clinician facilitated processing group on pertinent issues.   Therapist Response: [name] is a 45 y.o. female who presents with depression symptoms. Patient arrived within time allowed and reports that she is feeling "bad." Patient rates her mood at a 2 on a scale of 1-10 with 10 being great. Pt states there's "something new all the time" and her bottom floor flooded over the weekend. Pt reports struggling with poor support and issues with her children. Pt states experiencing SI over the weekend and denied  plan or intent. Pt struggles with managing stressors. Pt able to process. Pt engaged in discussion.         Session Time: 10:00 - 11:00   Participation Level: Active   Behavioral Response: CasualAlertDepressed   Type of Therapy: Group Therapy   Treatment Goals addressed: Coping   Interventions: CBT, DBT, Supportive and Reframing   Summary: Cln led processing group for pt's current struggles. Group members shared stressors and provided support and feedback. Cln brought in topics of boundaries, healthy relationships, and unhealthy thought processes to inform discussion.      Therapist Response: Pt able to process and provide support to group.          Session Time: 11:00- 12:00   Participation Level: Active   Behavioral Response: CasualAlertDepressed   Type of Therapy: Group Therapy, OT   Treatment Goals addressed: Coping   Interventions: Psychosocial skills training, Supportive   Summary: Occupational Therapy group   Therapist Response: Patient engaged in group. See OT note.           Session Time: 12:00 -1:00   Participation Level: Active   Behavioral Response: CasualAlertDepressed   Type of Therapy: Group therapy   Treatment Goals addressed: Coping   Interventions: CBT; Solution focused; Supportive; Reframing   Summary: 12:00 - 12:50: Cln introduced topic of DBT distress tolerance skills. Cln provided context for distress tolerance skills and how to practice them. Cln introduced the ACCEPTS distraction skills and group discusses ways to utilize "A" activities.  12:50 -1:00 Clinician led check-out. Clinician assessed for immediate needs, medication compliance and efficacy, and safety concerns   Therapist Response: 12:50 - 1:00: Pt engaged in discussion and is able  to state 2 ways to practice the "A" skill. 12:50 - 1:00: At check-out, patient rates her mood at a 3 on a scale of 1-10 with 10 being great. Pt reports afternoon plans of resting. Pt demonstrates  some progress as evidenced by participating in first group session. Patient denies SI/HI at the end of group.    Suicidal/Homicidal: Nowithout intent/plan  Plan: Pt will continue in PHP while working to decrease depression symptoms, increase emotional regulation, and increase ability to manage symptoms in a healthy manner.   Diagnosis: Severe episode of recurrent major depressive disorder, without psychotic features ([name]) [F33.2]    1. Severe episode of recurrent major depressive disorder, without psychotic features ([name])       Lorin Glass, LCSW 01/02/2021

## 2021-01-03 ENCOUNTER — Other Ambulatory Visit (HOSPITAL_COMMUNITY): Payer: Commercial Managed Care - PPO

## 2021-01-03 ENCOUNTER — Ambulatory Visit (HOSPITAL_COMMUNITY): Payer: Commercial Managed Care - PPO

## 2021-01-03 LAB — SARS-COV-2, NAA 2 DAY TAT

## 2021-01-03 LAB — NOVEL CORONAVIRUS, NAA: SARS-CoV-2, NAA: DETECTED — AB

## 2021-01-03 NOTE — Progress Notes (Signed)
Virtual Visit via Telephone Note  I connected with [name] on 01/03/21 at  9:00 AM EDT by telephone and verified that I am speaking with the correct person using two identifiers.  Location: Patient: Home Provider: Office   I discussed the limitations, risks, security and privacy concerns of performing an evaluation and management service by telephone and the availability of in person appointments. I also discussed with the patient that there may be a patient responsible charge related to this service. The patient expressed understanding and agreed to proceed.    I discussed the assessment and treatment plan with the patient. The patient was provided an opportunity to ask questions and all were answered. The patient agreed with the plan and demonstrated an understanding of the instructions.   The patient was advised to call back or seek an in-person evaluation if the symptoms worsen or if the condition fails to improve as anticipated.  I provided 15 minutes of non-face-to-face time during this encounter.   Derrill Center, NP   [facility] Outpatient Program Discharge Summary  [name] [id number]  Admission date: 12/01/2020 Discharge date: 01/01/2021  Reason for admission: Per admission assessment note: [name] is a 45 y.o. African American female presents with depression, and anxiety.  Patient was referred to [facility] by [facility] urgent care. As noted on assessment "[name] is a 45 y.o. female presents to [facility] urgent care seeking additional outpatient resources due to worsening depression and passive suicidal ideations.  [name] reports her main stressors is related to grief and loss of her father 10 years ago.  [name] reports the past Father's Day holiday brought back a lot of memories.  States [name] has 5 children. States that the 41 year old and 47 year old no longer reside in the home.  States 57 and 47 year old have  been out of control and oppositional.  Stated her 53 year old daughter has been running away and taking her vehicle without permission.   States [name] has been disrespectful and defiant which is causing 45 year old to emulate similar behaviors   Progress in Program Toward Treatment Goals: Ongoing, patient attended and participated with daily group session with active and engaged participation.  Continues to struggle with intermittent suicidal ideations due to financial stressors.  Reports some anxiety about returning back to work.  Was reported patient was recently diagnosed with COVID.  We will make medications available x30-day.  Support encouragement reassurance was provided  Progress (rationale): Patient to Keep followup with all outpatient providers.  Follow-up with mental health [facility] for additional group setting.   Take all medications as prescribed. Keep all follow-up appointments as scheduled.  Do not consume alcohol or use illegal drugs while on prescription medications. Report any adverse effects from your medications to your primary care provider promptly.  In the event of recurrent symptoms or worsening symptoms, call 911, a crisis hotline, or go to the nearest emergency department for evaluation.    Derrill Center, NP 01/03/2021

## 2021-01-04 ENCOUNTER — Ambulatory Visit (HOSPITAL_COMMUNITY): Payer: Commercial Managed Care - PPO

## 2021-01-04 ENCOUNTER — Other Ambulatory Visit (HOSPITAL_COMMUNITY): Payer: Commercial Managed Care - PPO

## 2021-01-05 ENCOUNTER — Ambulatory Visit (HOSPITAL_COMMUNITY): Payer: Commercial Managed Care - PPO

## 2021-01-05 ENCOUNTER — Other Ambulatory Visit (HOSPITAL_COMMUNITY): Payer: Commercial Managed Care - PPO

## 2021-01-08 ENCOUNTER — Telehealth (HOSPITAL_COMMUNITY): Payer: Self-pay | Admitting: Licensed Clinical Social Worker

## 2021-01-08 NOTE — Psych (Signed)
Virtual Visit via Video Note  I connected with [name] on 12/05/20 at  9:00 AM EDT by a video enabled telemedicine application and verified that I am speaking with the correct person using two identifiers.  Location: Patient: patient home Provider: clinical home office   I discussed the limitations of evaluation and management by telemedicine and the availability of in person appointments. The patient expressed understanding and agreed to proceed.  I discussed the assessment and treatment plan with the patient. The patient was provided an opportunity to ask questions and all were answered. The patient agreed with the plan and demonstrated an understanding of the instructions.   The patient was advised to call back or seek an in-person evaluation if the symptoms worsen or if the condition fails to improve as anticipated.  Pt was provided 240 minutes of non-face-to-face time during this encounter.   Lorin Glass, LCSW    [facility] PHP THERAPIST PROGRESS NOTE  [name] [id number]  Session Time: 9:00 - 10:00  Participation Level: Active  Behavioral Response: CasualAlertDepressed  Type of Therapy: Group Therapy  Treatment Goals addressed: Coping  Interventions: CBT, DBT, Supportive, and Reframing  Summary: Clinician led check-in regarding current stressors and situation. Clinician utilized active listening and empathetic response and validated patient emotions. Clinician facilitated processing group on pertinent issues.   Therapist Response: [name] is a 45 y.o. female who presents with depression symptoms. Patient arrived within time allowed and reports that she is feeling "better than yesterday." Patient rates her mood at a 3 on a scale of 1-10 with 10 being great. Pt states she's "not too worked up this morning." Pt reports feeling "up and down" yesterday and describes mood lability. Pt reports struggling with parenting and grief. Pt able to process. Pt engaged  in discussion.         Session Time: 10:00 - 11:00   Participation Level: Active   Behavioral Response: CasualAlertDepressed   Type of Therapy: Group Therapy   Treatment Goals addressed: Coping   Interventions: CBT, DBT, Supportive and Reframing   Summary: Cln led discussion on anxious behaviors. Group members shared patterns of anxiety they experience. Cln encouraged pt's to increase insight into the roots of their anxious behaviors to be able to address triggers.      Therapist Response: Pt engaged in discussion and reports increased insight into triggers.          Session Time: 11:00- 12:00   Participation Level: Active   Behavioral Response: CasualAlertDepressed   Type of Therapy: Group Therapy, OT   Treatment Goals addressed: Coping   Interventions: Psychosocial skills training, Supportive   Summary: Occupational Therapy group   Therapist Response: Patient engaged in group. See OT note.           Session Time: 12:00 -1:00   Participation Level: Active   Behavioral Response: CasualAlertDepressed   Type of Therapy: Group therapy   Treatment Goals addressed: Coping   Interventions: CBT; Solution focused; Supportive; Reframing   Summary: 12:00 - 12:50: Cln continued topic of DBT distress tolerance skills and the ACCEPTS distraction skill. Group reviewed C-C-E skills and discussed how they can practice them in their every day life.  12:50 -1:00 Clinician led check-out. Clinician assessed for immediate needs, medication compliance and efficacy, and safety concerns   Therapist Response: 12:50 - 1:00: Pt engaged in discussion and reports she is most likely to practice the "E" skill.  12:50 - 1:00: At check-out, patient rates her mood  at a 3 on a scale of 1-10 with 10 being great. Pt reports afternoon plans of resting. Pt demonstrates some progress as evidenced by decreased SI. Patient denies SI/HI at the end of group.    Suicidal/Homicidal: Nowithout  intent/plan  Plan: Pt will continue in PHP while working to decrease depression symptoms, increase emotional regulation, and increase ability to manage symptoms in a healthy manner.   Diagnosis: Severe episode of recurrent major depressive disorder, without psychotic features ([place]) [F33.2]    1. Severe episode of recurrent major depressive disorder, without psychotic features ([place])       Lorin Glass, LCSW 01/08/2021

## 2021-01-16 NOTE — Psych (Signed)
Virtual Visit via Video Note  I connected with [name] on 12/07/20 at  9:00 AM EDT by a video enabled telemedicine application and verified that I am speaking with the correct person using two identifiers.  Location: Patient: patient home Provider: clinical home office   I discussed the limitations of evaluation and management by telemedicine and the availability of in person appointments. The patient expressed understanding and agreed to proceed.  I discussed the assessment and treatment plan with the patient. The patient was provided an opportunity to ask questions and all were answered. The patient agreed with the plan and demonstrated an understanding of the instructions.   The patient was advised to call back or seek an in-person evaluation if the symptoms worsen or if the condition fails to improve as anticipated.  Pt was provided 240 minutes of non-face-to-face time during this encounter.   Lorin Glass, LCSW    [facility] PHP THERAPIST PROGRESS NOTE  [name] [id number]  Session Time: 9:00 - 10:00  Participation Level: Active  Behavioral Response: CasualAlertDepressed  Type of Therapy: Group Therapy  Treatment Goals addressed: Coping  Interventions: CBT, DBT, Supportive, and Reframing  Summary: Clinician led check-in regarding current stressors and situation. Clinician utilized active listening and empathetic response and validated patient emotions. Clinician facilitated processing group on pertinent issues.   Therapist Response: [name] is a 45 y.o. female who presents with depression symptoms. Patient arrived within time allowed and reports that she is feeling "not great." Patient rates her mood at a 4 on a scale of 1-10 with 10 being great. Pt reports staying around the house yesterday and making dinner. Pt reports struggling with sleep last night and having a panic attack early this morning. Pt states passive SI during the panic attack. Pt is  struggling with managing anxiety. Pt able to process. Pt engaged in discussion.         Session Time: 10:00 - 11:00   Participation Level: Active   Behavioral Response: CasualAlertDepressed   Type of Therapy: Group Therapy   Treatment Goals addressed: Coping   Interventions: CBT, DBT, Supportive and Reframing   Summary: Cln led discussion on "spiraling" or when our thoughts compound on one another to descend our feelings into worse and worse situations. Group members discussed the ways in which spiraling is difficult for them and when they are especially vulnerable. Cln offered DBT distraction skills as a way to halt the spiral once you recognize it.      Therapist Response: Pt engaged in discussion and reports spiraling is a major issue for her. Pt is able to increase awareness of spiraling throughout the discussion.         Session Time: 11:00- 12:00   Participation Level: Active   Behavioral Response: CasualAlertDepressed   Type of Therapy: Group Therapy, OT   Treatment Goals addressed: Coping   Interventions: Psychosocial skills training, Supportive   Summary: Occupational Therapy group   Therapist Response: Patient engaged in group. See OT note.           Session Time: 12:00 -1:00   Participation Level: Active   Behavioral Response: CasualAlertDepressed   Type of Therapy: Group therapy   Treatment Goals addressed: Coping   Interventions: CBT; Solution focused; Supportive; Reframing   Summary: 12:00 - 12:50: Cln continued topic of DBT distress tolerance skills and the ACCEPTS distraction skill. Group reviewed P-T-S skills and discussed how they can practice them in their every day life.   12:50 -  1:00 Clinician led check-out. Clinician assessed for immediate needs, medication compliance and efficacy, and safety concerns   Therapist Response: 12:50 - 1:00: Pt engaged in discussion and reports she is most likely to practice the "T" skill.  12:50 - 1:00: At  check-out, patient rates her mood at a 4 on a scale of 1-10 with 10 being great. Pt reports afternoon plans of making candy to distract herself. Pt demonstrates some progress as evidenced by navigating SI successfully. Patient denies SI/HI at the end of group.    Suicidal/Homicidal: Nowithout intent/plan  Plan: Pt will continue in PHP while working to decrease depression symptoms, increase emotional regulation, and increase ability to manage symptoms in a healthy manner.   Diagnosis: Severe episode of recurrent major depressive disorder, without psychotic features ([place]) [F33.2]    1. Severe episode of recurrent major depressive disorder, without psychotic features ([place])       Lorin Glass, LCSW 01/16/2021

## 2021-01-16 NOTE — Psych (Signed)
Virtual Visit via Video Note  I connected with [name] on 12/06/20 at  9:00 AM EDT by a video enabled telemedicine application and verified that I am speaking with the correct person using two identifiers.  Location: Patient: patient home Provider: clinical home office   I discussed the limitations of evaluation and management by telemedicine and the availability of in person appointments. The patient expressed understanding and agreed to proceed.  I discussed the assessment and treatment plan with the patient. The patient was provided an opportunity to ask questions and all were answered. The patient agreed with the plan and demonstrated an understanding of the instructions.   The patient was advised to call back or seek an in-person evaluation if the symptoms worsen or if the condition fails to improve as anticipated.  Pt was provided 240 minutes of non-face-to-face time during this encounter.   Lorin Glass, LCSW    [facility] PHP THERAPIST PROGRESS NOTE  [name] [id number]  Session Time: 9:00 - 10:00  Participation Level: Active  Behavioral Response: CasualAlertDepressed  Type of Therapy: Group Therapy  Treatment Goals addressed: Coping  Interventions: CBT, DBT, Supportive, and Reframing  Summary: Clinician led check-in regarding current stressors and situation. Clinician utilized active listening and empathetic response and validated patient emotions. Clinician facilitated processing group on pertinent issues.   Therapist Response: [name] is a 45 y.o. female who presents with depression symptoms. Patient arrived within time allowed and reports that she is feeling "not too bad." Patient rates her mood at a 3.5 on a scale of 1-10 with 10 being great. Pt reports spending much of the afternoon running around for other people. Pt states sleeping 2 hours and having passive SI while unable to sleep. Pt states struggling with her children's demands on her time.   Pt able to process. Pt engaged in discussion.         Session Time: 10:00 - 11:00   Participation Level: Active   Behavioral Response: CasualAlertDepressed   Type of Therapy: Group Therapy   Treatment Goals addressed: Coping   Interventions: CBT, DBT, Supportive and Reframing   Summary: Cln facilitated processing group around difficult relationships. Group members shared struggles they face in relationships. Cln brought in topics of self-esteem, CBT thought challenging, boundaries, and communication to aid growth.     Therapist Response: Pt engaged in discussion and is able to process.             Session Time: 11:00 - 12:00   Participation Level: Active   Behavioral Response: CasualAlertDepressed   Type of Therapy: Group Therapy   Treatment Goals addressed: Coping   Interventions: Supportive, Reframing   Summary: Spiritual Care group   Therapist Response: Pt engaged in session. See chaplain note           Session Time: 12:00 -1:00   Participation Level: Active   Behavioral Response: CasualAlertDepressed   Type of Therapy: Group therapy   Treatment Goals addressed: Coping   Interventions: Psychosocial skills training, Supportive   Summary: 12:00 - 12:50: Occupational Therapy group 12:50 -1:00 Clinician led check-out. Clinician assessed for immediate needs, medication compliance and efficacy, and safety concerns   Therapist Response: 12:00 - 12:50: Patient engaged in group. See OT note.  12:50 - 1:00: At check-out, patient rates her mood at a 5 on a scale of 1-10 with 10 being great. Pt reports afternoon plans of showering and leaving the house. Pt demonstrates some progress as evidenced by utilizing strategies  discussed in group. Patient denies SI/HI at the end of group.    Suicidal/Homicidal: Nowithout intent/plan  Plan: Pt will continue in PHP while working to decrease depression symptoms, increase emotional regulation, and increase ability to  manage symptoms in a healthy manner.   Diagnosis: Severe episode of recurrent major depressive disorder, without psychotic features ([place]) [F33.2]    1. Severe episode of recurrent major depressive disorder, without psychotic features ([place])       Lorin Glass, LCSW 01/16/2021

## 2021-01-16 NOTE — Psych (Signed)
Virtual Visit via Video Note  I connected with [name] on 12/08/20 at  9:00 AM EDT by a video enabled telemedicine application and verified that I am speaking with the correct person using two identifiers.  Location: Patient: patient home Provider: clinical home office   I discussed the limitations of evaluation and management by telemedicine and the availability of in person appointments. The patient expressed understanding and agreed to proceed.  I discussed the assessment and treatment plan with the patient. The patient was provided an opportunity to ask questions and all were answered. The patient agreed with the plan and demonstrated an understanding of the instructions.   The patient was advised to call back or seek an in-person evaluation if the symptoms worsen or if the condition fails to improve as anticipated.  Pt was provided 240 minutes of non-face-to-face time during this encounter.   Lorin Glass, LCSW    [facility] PHP THERAPIST PROGRESS NOTE  [name] [id number]  Session Time: 9:00 - 10:00  Participation Level: Active  Behavioral Response: CasualAlertDepressed  Type of Therapy: Group Therapy  Treatment Goals addressed: Coping  Interventions: CBT, DBT, Supportive, and Reframing  Summary: Clinician led check-in regarding current stressors and situation. Clinician utilized active listening and empathetic response and validated patient emotions. Clinician facilitated processing group on pertinent issues.   Therapist Response: [name] is a 45 y.o. female who presents with depression symptoms. Patient arrived within time allowed and reports that she is feeling "anxious." Patient rates her mood at a 4.5 on a scale of 1-10 with 10 being great. Pt reports struggling with SI after group yesterday and that she managed by visiting her son. Pt states receiving support and care from him which improved her mood. Pt reports making candy in the evening because  that is a task that relaxes her. Pt reports sleeping approximately 6 hours which is improvement. Pt reports struggle with managing anxiety. Pt able to process. Pt engaged in discussion.         Session Time: 10:00 - 11:00   Participation Level: Active   Behavioral Response: CasualAlertDepressed   Type of Therapy: Group Therapy   Treatment Goals addressed: Coping   Interventions: CBT, DBT, Supportive and Reframing   Summary: Cln led discussion on communication struggles. Group shared ways in which communication is difficult for them and what are typical barriers. Cln reminded pt's of assertiveness skills and "I" statements. Cln encouaged pt's to consider making notes for points they want to address in a discussion and/or script out what they want to say as a way to decrease anxiety and likeliness that they will get off topic.      Therapist Response: Pt engaged in discussion and reports willingness to apply communication strategies.           Session Time: 11:00- 12:00   Participation Level: Active   Behavioral Response: CasualAlertDepressed   Type of Therapy: Group Therapy   Treatment Goals addressed: Coping   Interventions: CBT, DBT, Supportive and Reframing   Summary: Cln led discussion on healthy aggression substitutes. Cln discussed the benefits to discharging energy and adrenaline when feeling "revved up" in emotion and the importance of balancing that discharge with safety and lack if negative consequences. Group brainstormed different ways to channel agression in a healthy way and shared ways that have worked for them in the past.   Therapist Response: Pt engaged in discussion and identifies 3 options to try.  Session Time: 12:00 -1:00   Participation Level: Active   Behavioral Response: CasualAlertDepressed   Type of Therapy: Group therapy   Treatment Goals addressed: Coping   Interventions: CBT; Solution focused; Supportive; Reframing   Summary:  12:00 - 12:50: Cln led discussion on ways to manage stressors and feelings over the weekend. Group members  brainstormed things to do over the weekend for multiple levels of energy, access, and moods. Cln reviewed crisis services should they be needed and provided pt's with the text crisis line, mobile crisis, national suicide hotline, [facility] 24/7 line, and information on [facility].    12:50 -1:00 Clinician led check-out. Clinician assessed for immediate needs, medication compliance and efficacy, and safety concerns   Therapist Response: 12:50 - 1:00: Pt engaged in discussion and is able to identify 3 ideas of what to do over the weekend to keep their mind engaged.  12:50 - 1:00: At check-out, patient rates her mood at a 4 on a scale of 1-10 with 10 being great. Pt reports afternoon plans of showering and journaling. Pt demonstrates some progress as evidenced by navigating SI successfully. Patient denies SI/HI at the end of group.    Suicidal/Homicidal: Nowithout intent/plan  Plan: Pt will continue in PHP while working to decrease depression symptoms, increase emotional regulation, and increase ability to manage symptoms in a healthy manner.   Diagnosis: MDD (major depressive disorder), recurrent severe, without psychosis ([place]) [F33.2]    1. MDD (major depressive disorder), recurrent severe, without psychosis ([place])       Lorin Glass, LCSW 01/16/2021

## 2021-01-17 NOTE — Psych (Signed)
Virtual Visit via Video Note  I connected with [name] on 12/12/20 at  9:00 AM EDT by a video enabled telemedicine application and verified that I am speaking with the correct person using two identifiers.  Location: Patient: patient home Provider: clinical home office   I discussed the limitations of evaluation and management by telemedicine and the availability of in person appointments. The patient expressed understanding and agreed to proceed.  I discussed the assessment and treatment plan with the patient. The patient was provided an opportunity to ask questions and all were answered. The patient agreed with the plan and demonstrated an understanding of the instructions.   The patient was advised to call back or seek an in-person evaluation if the symptoms worsen or if the condition fails to improve as anticipated.  Pt was provided 240 minutes of non-face-to-face time during this encounter.   Lorin Glass, LCSW    [facility] PHP THERAPIST PROGRESS NOTE  [name] [id number]  Session Time: 9:00 - 10:00  Participation Level: Active  Behavioral Response: CasualAlertDepressed  Type of Therapy: Group Therapy  Treatment Goals addressed: Coping  Interventions: CBT, DBT, Supportive, and Reframing  Summary: Clinician led check-in regarding current stressors and situation. Clinician utilized active listening and empathetic response and validated patient emotions. Clinician facilitated processing group on pertinent issues.   Therapist Response: [name] is a 45 y.o. female who presents with depression symptoms. Patient arrived within time allowed and reports that she is feeling "groggy." Patient rates her mood at a 2 on a scale of 1-10 with 10 being great. Pt reports she had a "bad" weekend. Pt states she tried to sleep but was not successful and had bad dreams regarding her dead relatives which upset her. Pt states she is experimenting with her sleep medications,  taking them at different times and inconsistently taking the medications. Pt reports having passive SI over the weekend triggered by loneliness. Pt denies current SI. Pt able to process. Pt engaged in discussion.         Session Time: 10:00 - 11:00   Participation Level: Active   Behavioral Response: CasualAlertDepressed   Type of Therapy: Group Therapy   Treatment Goals addressed: Coping   Interventions: CBT, DBT, Supportive and Reframing   Summary: Cln led processing group for pt's current struggles. Group members shared stressors and provided support and feedback. Cln brought in topics of boundaries, healthy relationships, and unhealthy thought processes to inform discussion.      Therapist Response: Pt able to process and provide support to group.          Session Time: 11:00- 12:00   Participation Level: Active   Behavioral Response: CasualAlertDepressed   Type of Therapy: Group Therapy, OT   Treatment Goals addressed: Coping   Interventions: Psychosocial skills training, Supportive   Summary: Occupational Therapy group   Therapist Response: Patient engaged in group. See OT note.           Session Time: 12:00 -1:00   Participation Level: Active   Behavioral Response: CasualAlertDepressed   Type of Therapy: Group therapy   Treatment Goals addressed: Coping   Interventions: CBT; Solution focused; Supportive; Reframing   Summary: 12:00 - 12:50: Cln introduced topic of boundaries. Cln discussed how boundaries inform our relationships and affect self-esteem and personal agency. Group discussed the three types of boundaries: rigid, porous, and healthy and when each type is most helpful/harmful.  12:50 -1:00 Clinician led check-out. Clinician assessed for immediate needs, medication  compliance and efficacy, and safety concerns   Therapist Response: 12:50 - 1:00: Pt engaged in discussion and reports having mostly porous boundaries.  12:50 - 1:00: At check-out,  patient rates her mood at a 5.5 on a scale of 1-10 with 10 being great. Pt reports afternoon plans of getting out of the house. Pt demonstrates some progress as evidenced by setting a boundary. Patient denies SI/HI at the end of group.    Suicidal/Homicidal: Nowithout intent/plan  Plan: Pt will continue in PHP while working to decrease depression symptoms, increase emotional regulation, and increase ability to manage symptoms in a healthy manner.   Diagnosis: Severe episode of recurrent major depressive disorder, without psychotic features ([place]) [F33.2]    1. Severe episode of recurrent major depressive disorder, without psychotic features ([place])       Lorin Glass, LCSW 01/17/2021

## 2021-01-23 NOTE — Psych (Signed)
Virtual Visit via Video Note  I connected with [name] on 12/15/20 at  9:00 AM EDT by a video enabled telemedicine application and verified that I am speaking with the correct person using two identifiers.  Location: Patient: patient home Provider: clinical home office   I discussed the limitations of evaluation and management by telemedicine and the availability of in person appointments. The patient expressed understanding and agreed to proceed.  I discussed the assessment and treatment plan with the patient. The patient was provided an opportunity to ask questions and all were answered. The patient agreed with the plan and demonstrated an understanding of the instructions.   The patient was advised to call back or seek an in-person evaluation if the symptoms worsen or if the condition fails to improve as anticipated.  Pt was provided 240 minutes of non-face-to-face time during this encounter.   Lorin Glass, LCSW    [facility] PHP THERAPIST PROGRESS NOTE  SRI BODIFORD [id number]  Session Time: 9:00 - 10:00  Participation Level: Active  Behavioral Response: CasualAlertDepressed  Type of Therapy: Group Therapy  Treatment Goals addressed: Coping  Interventions: CBT, DBT, Supportive, and Reframing  Summary: Clinician led check-in regarding current stressors and situation. Clinician utilized active listening and empathetic response and validated patient emotions. Clinician facilitated processing group on pertinent issues.   Therapist Response: ANOUK BILBERRY is a 45 y.o. female who presents with depression symptoms. Patient arrived within time allowed and reports that she is feeling "anxious." Patient rates her mood at a 3.5 on a scale of 1-10 with 10 being great. Pt reports her anxiety increased after group yesterday and has lasted. Pt states she had bad dreams last night and it caused broken sleep. Pt reports feelings of hopelessness. Pt reports concern that menopause  may be causing some of her symptoms and pt encouraged her to discuss tha with her PCP. Pt struggled with emotional regulation.  Pt able to process. Pt engaged in discussion.          Session Time: 10:00 - 11:00   Participation Level: Active   Behavioral Response: CasualAlertDepressed   Type of Therapy: Group Therapy   Treatment Goals addressed: Coping   Interventions: CBT, DBT, Supportive and Reframing   Summary: Cln introduced DBT concept of radical acceptance. Cln discussed radical acceptance as a strategy to decrease distress and to manage situations outside of their control. Group volunteered struggles they are experiencing with control and cln helped group apply radical acceptance.      Therapist Response: Pt engaged in discussion and identified ways she can apply radical acceptance in her life.           Session Time: 11:00- 12:00   Participation Level: Active   Behavioral Response: CasualAlertDepressed   Type of Therapy: Group Therapy, OT   Treatment Goals addressed: Coping   Interventions: Psychosocial skills training, Supportive   Summary: Occupational Therapy group   Therapist Response: Patient engaged in group. See OT note.           Session Time: 12:00 -1:00   Participation Level: Active   Behavioral Response: CasualAlertDepressed   Type of Therapy: Group therapy   Treatment Goals addressed: Coping   Interventions: CBT; Solution focused; Supportive; Reframing   Summary: 12:00 - 12:50: Cln continued topic of boundaries. Cln discussed material and time boundaries including they way they present and difficulties with both. Group members discussed the ways in which material and time boundaries is a struggle for them.  12:50 -1:00 Clinician led check-out. Clinician assessed for immediate needs, medication compliance and efficacy, and safety concerns   Therapist Response: 12:50 - 1:00: Pt engaged in discussion and is able to identify ways in which  material and time boundaries affect them. 12:50 - 1:00: At check-out, patient rates her mood at a 6 on a scale of 1-10 with 10 being great. Pt reports afternoon plans of resting. Pt demonstrates some progress as evidenced by managing SI. Patient denies SI/HI at the end of group.    Suicidal/Homicidal: Nowithout intent/plan  Plan: Pt will continue in PHP while working to decrease depression symptoms, increase emotional regulation, and increase ability to manage symptoms in a healthy manner.   Diagnosis: Severe episode of recurrent major depressive disorder, without psychotic features ([place]) [F33.2]    1. Severe episode of recurrent major depressive disorder, without psychotic features ([place])       Lorin Glass, LCSW 01/23/2021

## 2021-01-23 NOTE — Psych (Signed)
Virtual Visit via Video Note  I connected with [name] on 12/14/20 at  9:00 AM EDT by a video enabled telemedicine application and verified that I am speaking with the correct person using two identifiers.  Location: Patient: patient home Provider: clinical home office   I discussed the limitations of evaluation and management by telemedicine and the availability of in person appointments. The patient expressed understanding and agreed to proceed.  I discussed the assessment and treatment plan with the patient. The patient was provided an opportunity to ask questions and all were answered. The patient agreed with the plan and demonstrated an understanding of the instructions.   The patient was advised to call back or seek an in-person evaluation if the symptoms worsen or if the condition fails to improve as anticipated.  Pt was provided 240 minutes of non-face-to-face time during this encounter.   Lorin Glass, LCSW    [facility] PHP THERAPIST PROGRESS NOTE  [name] [id number]  Session Time: 9:00 - 10:00  Participation Level: Active  Behavioral Response: CasualAlertDepressed  Type of Therapy: Group Therapy  Treatment Goals addressed: Coping  Interventions: CBT, DBT, Supportive, and Reframing  Summary: Clinician led check-in regarding current stressors and situation. Clinician utilized active listening and empathetic response and validated patient emotions. Clinician facilitated processing group on pertinent issues.   Therapist Response: [name] is a 45 y.o. female who presents with depression symptoms. Patient arrived within time allowed and reports that she is feeling "okay." Patient rates her mood at a 6 on a scale of 1-10 with 10 being great. Pt reports Tuesday afternoon was bad and she had an issue with her daughter which triggered SI. Pt states she took a cool bath and got out of the house to manage. Pt states since that episode she has been able to  "push" herself more each day. Pt reports continued issues with feeling groggy and irregular sleep patterns. Pt able to process. Pt engaged in discussion.         Session Time: 10:00 - 11:00   Participation Level: Active   Behavioral Response: CasualAlertDepressed   Type of Therapy: Group Therapy   Treatment Goals addressed: Coping   Interventions: CBT, DBT, Supportive and Reframing   Summary: Cln led discussion on the 5 stages of grief and the way loss affects [place]. Cln encouraged pt to consider grief as a journey to accepting a new future. Cln created space for pt to process grief concerns and validated pt's experiences.      Therapist Response: Pt engaged in discussion and was able to identify areas of loss and process.           Session Time: 11:00- 12:00   Participation Level: Active   Behavioral Response: CasualAlertDepressed   Type of Therapy: Group Therapy, OT   Treatment Goals addressed: Coping   Interventions: Psychosocial skills training, Supportive   Summary: Occupational Therapy group   Therapist Response: Patient engaged in group. See OT note.           Session Time: 12:00 -1:00   Participation Level: Active   Behavioral Response: CasualAlertDepressed   Type of Therapy: Group therapy   Treatment Goals addressed: Coping   Interventions: CBT; Solution focused; Supportive; Reframing   Summary: 12:00 - 12:50: Cln continued topic of boundaries. Cln discussed emotional and intellectual boundaries including they way they present and difficulties with both. Group members discussed the ways in which emotional and intellectual boundaries is a struggle for them.  12:50 -1:00 Clinician led check-out. Clinician assessed for immediate needs, medication compliance and efficacy, and safety concerns   Therapist Response: 12:50 - 1:00: Pt engaged in discussion and is able to identify ways in which emotional and intellectual boundaries affect them. 12:50 - 1:00: At  check-out, patient rates her mood at a 6 on a scale of 1-10 with 10 being great. Pt reports afternoon plans of chores. Pt demonstrates some progress as evidenced by use of multiple coping skills to manage moods. Patient denies SI/HI at the end of group.    Suicidal/Homicidal: Nowithout intent/plan  Plan: Pt will continue in PHP while working to decrease depression symptoms, increase emotional regulation, and increase ability to manage symptoms in a healthy manner.   Diagnosis: Severe episode of recurrent major depressive disorder, without psychotic features ([place]) [F33.2]    1. Severe episode of recurrent major depressive disorder, without psychotic features ([place])       Lorin Glass, LCSW 01/23/2021

## 2021-02-05 NOTE — Psych (Signed)
Virtual Visit via Video Note  I connected with [name] on 12/19/20 at  9:00 AM EDT by a video enabled telemedicine application and verified that I am speaking with the correct person using two identifiers.  Location: Patient: patient home Provider: clinical home office   I discussed the limitations of evaluation and management by telemedicine and the availability of in person appointments. The patient expressed understanding and agreed to proceed.  I discussed the assessment and treatment plan with the patient. The patient was provided an opportunity to ask questions and all were answered. The patient agreed with the plan and demonstrated an understanding of the instructions.   The patient was advised to call back or seek an in-person evaluation if the symptoms worsen or if the condition fails to improve as anticipated.  Pt was provided 240 minutes of non-face-to-face time during this encounter.   Lorin Glass, LCSW    [facility] PHP THERAPIST PROGRESS NOTE  [name] [id number]  Session Time: 9:00 - 10:00  Participation Level: Active  Behavioral Response: CasualAlertDepressed  Type of Therapy: Group Therapy  Treatment Goals addressed: Coping  Interventions: CBT, DBT, Supportive, and Reframing  Summary: Clinician led check-in regarding current stressors and situation. Clinician utilized active listening and empathetic response and validated patient emotions. Clinician facilitated processing group on pertinent issues.   Therapist Response: [name] is a 45 y.o. female who presents with depression symptoms. Patient arrived within time allowed and reports that she is feeling "okay mentally." Patient rates her mood at a 5.5 on a scale of 1-10 with 10 being great. Pt states she is ill and there is a virus circulating around her house. Pt reports it is affecting her mood, sleep, and energy level. Pt reports some passive SI "in the background" and that she has been  able to manage it. Pt able to process. Pt engaged in discussion.          Session Time: 10:00 - 11:00   Participation Level: Active   Behavioral Response: CasualAlertDepressed   Type of Therapy: Group Therapy   Treatment Goals addressed: Coping   Interventions: CBT, DBT, Supportive and Reframing   Summary: Cln led discussion on anxious behaviors. Group members shared patterns of anxiety they experience. Cln encouraged pt's to increase insight into the roots of their anxious behaviors to be able to address triggers.      Therapist Response: Pt engaged in discussion and reports increased insight into triggers.          Session Time: 11:00- 12:00   Participation Level: Active   Behavioral Response: CasualAlertDepressed   Type of Therapy: Group Therapy, OT   Treatment Goals addressed: Coping   Interventions: Psychosocial skills training, Supportive   Summary: Occupational Therapy group   Therapist Response: Patient engaged in group. See OT note.           Session Time: 12:00 -1:00   Participation Level: Active   Behavioral Response: CasualAlertDepressed   Type of Therapy: Group therapy   Treatment Goals addressed: Coping   Interventions: CBT; Solution focused; Supportive; Reframing   Summary: 12:00 - 12:50: Cln continued topic of boundaries and introduced how to set and maintain healthy boundaries. Cln utilized handout "how to set boundaries" and group members worked through examples to practice setting appropriate boundaries.  12:50 -1:00 Clinician led check-out. Clinician assessed for immediate needs, medication compliance and efficacy, and safety concerns   Therapist Response: 12:50 - 1:00: Pt engaged in discussion and reports struggle  with maintaining boundaries.  12:50 - 1:00: At check-out, patient rates her mood at a 4 on a scale of 1-10 with 10 being great. Pt reports afternoon plans of resting. Pt demonstrates some progress as evidenced by socializing  over the weekend. Patient denies SI/HI at the end of group.    Suicidal/Homicidal: Nowithout intent/plan  Plan: Pt will continue in PHP while working to decrease depression symptoms, increase emotional regulation, and increase ability to manage symptoms in a healthy manner.   Diagnosis: Severe episode of recurrent major depressive disorder, without psychotic features ([place]) [F33.2]    1. Severe episode of recurrent major depressive disorder, without psychotic features ([place])       Lorin Glass, LCSW 02/05/2021
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
## 2021-02-08 NOTE — Psych (Signed)
Virtual Visit via Video Note  I connected with [name] on 12/22/20 at  9:00 AM EDT by a video enabled telemedicine application and verified that I am speaking with the correct person using two identifiers.  Location: Patient: patient home Provider: clinical home office   I discussed the limitations of evaluation and management by telemedicine and the availability of in person appointments. The patient expressed understanding and agreed to proceed.  I discussed the assessment and treatment plan with the patient. The patient was provided an opportunity to ask questions and all were answered. The patient agreed with the plan and demonstrated an understanding of the instructions.   The patient was advised to call back or seek an in-person evaluation if the symptoms worsen or if the condition fails to improve as anticipated.  Pt was provided 240 minutes of non-face-to-face time during this encounter.   Lorin Glass, LCSW    [facility] PHP THERAPIST PROGRESS NOTE  [name] [id number]  Session Time: 9:00 - 10:00  Participation Level: Active  Behavioral Response: CasualAlertDepressed  Type of Therapy: Group Therapy  Treatment Goals addressed: Coping  Interventions: CBT, DBT, Supportive, and Reframing  Summary: Clinician led check-in regarding current stressors and situation. Clinician utilized active listening and empathetic response and validated patient emotions. Clinician facilitated processing group on pertinent issues.   Therapist Response: [name] is a 45 y.o. female who presents with depression symptoms. Patient arrived within time allowed and reports that she is feeling "okay." Patient rates her mood at a 5 on a scale of 1-10 with 10 being great. Pt reports "yesterday ended up pretty good" and she was able to declutter around the house. Pt states she picked up her new prescriptions yesterday and began the new medication last night. Pt continues to struggle  with practical issues. Pt reports difficulty with ruminating affecting her sleep negatively. Pt able to process. Pt engaged in discussion. Pt sounds congested.          Session Time: 10:00 - 11:00   Participation Level: Active   Behavioral Response: CasualAlertDepressed   Type of Therapy: Group Therapy   Treatment Goals addressed: Coping   Interventions: CBT, DBT, Supportive and Reframing   Summary: Cln led discussion on how to handle big feelings. Cln encouraged pt's to use the intensity of the feeling to guide how to proceed: if it is a lower intensity problem solving and thought challenging can be useful, for a larger intensity distress tolerance skills are the most effective strategy.      Therapist Response: Pt engaged in discussion and is able to share ways in which re-thinking action when they are emotional may benefit.           Session Time: 11:00- 12:00   Participation Level: Active   Behavioral Response: CasualAlertDepressed   Type of Therapy: Group Therapy, OT   Treatment Goals addressed: Coping   Interventions: Psychosocial skills training, Supportive   Summary: Occupational Therapy group   Therapist Response: Patient engaged in group. See OT note.           Session Time: 12:00 -1:00   Participation Level: Active   Behavioral Response: CasualAlertDepressed   Type of Therapy: Group therapy   Treatment Goals addressed: Coping   Interventions: CBT; Solution focused; Supportive; Reframing   Summary: 12:00 - 12:50: Cln led activity on finding what makes you happy. Cln tasked group members to consider times in which they have been happy in the past, moments in which  they have felt less distressed recently, and things that give them any rumblings of joy. Cln worked with pt's to process barriers and to consider one thing for now, not the bigger picture.  12:50 -1:00 Clinician led check-out. Clinician assessed for immediate needs, medication compliance and  efficacy, and safety concerns   Therapist Response: 12:50 - 1:00: Pt engaged in discussion. Pt able to process and come up with one thing thing to do to make herself happy.  12:50 - 1:00: At check-out, patient rates her mood at a 5 on a scale of 1-10 with 10 being great. Pt reports afternoon plans of going to the grocery. Pt demonstrates some progress as evidenced by increased ability to reframe. Patient denies SI/HI at the end of group.    Suicidal/Homicidal: Nowithout intent/plan  Plan: Pt will continue in PHP while working to decrease depression symptoms, increase emotional regulation, and increase ability to manage symptoms in a healthy manner.   Diagnosis: Severe episode of recurrent major depressive disorder, without psychotic features ([place]) [F33.2]    1. Severe episode of recurrent major depressive disorder, without psychotic features ([place])       Lorin Glass, LCSW 02/08/2021

## 2021-02-15 NOTE — Progress Notes (Signed)
Virtual Visit via Video Note   I connected with [name] on 12/27/20 at  9:00 AM EDT by a video enabled telemedicine application and verified that I am speaking with the correct person using two identifiers.   Location: Patient: patient home Provider: clinical home office   I discussed the limitations of evaluation and management by telemedicine and the availability of in person appointments. The patient expressed understanding and agreed to proceed.   I discussed the assessment and treatment plan with the patient. The patient was provided an opportunity to ask questions and all were answered. The patient agreed with the plan and demonstrated an understanding of the instructions.   The patient was advised to call back or seek an in-person evaluation if the symptoms worsen or if the condition fails to improve as anticipated.   Pt was provided 120 minutes of non-face-to-face time during this encounter.     Lorin Glass, LCSW       [facility] PHP THERAPIST PROGRESS NOTE   [name] [id number]   Session Time: 9:00 - 10:00   Participation Level: Active   Behavioral Response: CasualAlertDepressed   Type of Therapy: Group Therapy   Treatment Goals addressed: Coping   Interventions: CBT, DBT, Supportive, and Reframing   Summary: Clinician led check-in regarding current stressors and situation. Clinician utilized active listening and empathetic response and validated patient emotions. Clinician facilitated processing group on pertinent issues.   Therapist Response: [name] is a 45 y.o. female who presents with depression symptoms. Patient arrived within time allowed and reports that she is feeling "rough." Patient rates her mood at a 3.5 on a scale of 1-10 with 10 being great. Pt reports she had a colonoscopy yesterday and it went okay and there was nothing notable found. Pt reports spending the majority of the day sleeping after her appointment. Pt reports feeling  disappointed because the ride she had organized fell through. Pt reports poor sleep. Pt able to process. Pt engaged in discussion.          Session Time: 10:00 - 11:00   Participation Level: Active   Behavioral Response: CasualAlertDepressed   Type of Therapy: Group Therapy   Treatment Goals addressed: Coping   Interventions: CBT, DBT, Supportive and Reframing   Summary: Cln led discussion on personal standards and they way in which it impacts the way we view ourselves and our abilties. Group members discussed judgment, struggles, and barriers they experience in terms of personal standards. Cln brought in topics of balance, grace, and kindness. Cln proposed the "best friend test" as a way to calibrate whether we are vieweing our situation with kindness or harshness.      Therapist Response: Pt engaged in discussion and is able to process.    *Pt chose to leave group at 11 due to having a medical appointment.     Suicidal/Homicidal: Nowithout intent/plan   Plan: Pt will continue in PHP while working to decrease depression symptoms, increase emotional regulation, and increase ability to manage symptoms in a healthy manner.    Diagnosis:      Severe episode of recurrent major depressive disorder, without psychotic features ([place]) [F33.2]                          1. Severe episode of recurrent major depressive disorder, without psychotic features ([place])           Lorin Glass, LCSW 02/15/2021

## 2021-02-16 NOTE — Psych (Signed)
Virtual Visit via Video Note  I connected with [name] on 12/28/20 at  9:00 AM EDT by a video enabled telemedicine application and verified that I am speaking with the correct person using two identifiers.  Location: Patient: patient home Provider: clinical home office   I discussed the limitations of evaluation and management by telemedicine and the availability of in person appointments. The patient expressed understanding and agreed to proceed.  I discussed the assessment and treatment plan with the patient. The patient was provided an opportunity to ask questions and all were answered. The patient agreed with the plan and demonstrated an understanding of the instructions.   The patient was advised to call back or seek an in-person evaluation if the symptoms worsen or if the condition fails to improve as anticipated.  Pt was provided 240 minutes of non-face-to-face time during this encounter.   Pamela Glass, LCSW    [facility] PHP THERAPIST PROGRESS NOTE  [name] [id number]  Session Time: 9:00 - 10:00  Participation Level: Active  Behavioral Response: CasualAlertDepressed  Type of Therapy: Group Therapy  Treatment Goals addressed: Coping  Interventions: CBT, DBT, Supportive, and Reframing  Summary: Clinician led check-in regarding current stressors and situation. Clinician utilized active listening and empathetic response and validated patient emotions. Clinician facilitated processing group on pertinent issues.   Therapist Response: [name] is a 45 y.o. female who presents with depression symptoms. Patient arrived within time allowed and reports that she is feeling "depressed and anxious." Patient rates her mood at a 3 on a scale of 1-10 with 10 being great. Pt reports today is her father's birthday and it is typically a difficult day for her since his death. Pt appears agitated. Pt reports poor sleep this week and inconsistent patterns of taking sleep  medication. Pt reports struggle with control. Pt able to process. Pt engaged in discussion.          Session Time: 10:00 - 11:00   Participation Level: Active   Behavioral Response: CasualAlertDepressed   Type of Therapy: Group Therapy   Treatment Goals addressed: Coping   Interventions: CBT, DBT, Supportive and Reframing   Summary: Cln led discussion on anxious behaviors. Group members shared patterns of anxiety they experience. Cln encouraged pt's to increase insight into the roots of their anxious behaviors to be able to address triggers.      Therapist Response: Pt engaged in discussion and reports increased insight into triggers.          Session Time: 11:00- 12:00   Participation Level: Active   Behavioral Response: CasualAlertDepressed   Type of Therapy: Group Therapy, OT   Treatment Goals addressed: Coping   Interventions: Psychosocial skills training, Supportive   Summary: Occupational Therapy group   Therapist Response: Patient engaged in group. See OT note.           Session Time: 12:00 -1:00   Participation Level: Active   Behavioral Response: CasualAlertDepressed   Type of Therapy: Group therapy   Treatment Goals addressed: Coping   Interventions: CBT; Solution focused; Supportive; Reframing   Summary: 12:00 - 12:50:Cln continued topic of DBT distress tolerance skills. Cln introduced Self-Soothe skills. Group discussed ways they can utilize the five senses to soothe themselves when struggling.  12:50 -1:00 Clinician led check-out. Clinician assessed for immediate needs, medication compliance and efficacy, and safety concerns   Therapist Response: 12:50 - 1:00:  Pt engaged in discussion and reports most likely to use touch in self soothing.  12:50 - 1:00: At check-out, patient rates her mood at a 3 on a scale of 1-10 with 10 being great. Pt reports afternoon plans of talking to her son. Pt demonstrates some progress as evidenced by reaching out  to support. Patient denies SI/HI at the end of group. Pt reports being unsure if she wants to continue in treatment stating she needs to be back in her daily routine to "get better" and that she has too much down time. Pt reports she wants to continue to move forward for now.    Suicidal/Homicidal: Nowithout intent/plan  Plan: Pt will continue in PHP while working to decrease depression symptoms, increase emotional regulation, and increase ability to manage symptoms in a healthy manner.   Diagnosis: MDD (major depressive disorder), recurrent severe, without psychosis ([place]) [F33.2]    1. MDD (major depressive disorder), recurrent severe, without psychosis ([place])       Pamela Glass, LCSW 02/16/2021

## 2021-05-15 ENCOUNTER — Telehealth (HOSPITAL_COMMUNITY): Payer: Self-pay | Admitting: Licensed Clinical Social Worker

## 2021-05-15 NOTE — Telephone Encounter (Signed)
Pt granted [place] permission to handle STD and speak to who is necessary upon admission to [facility]. Case manager for claim, April, left message requesting the specific dates in which pt was in attendance for PHP and asked whether our services were virtual or in-person when pt was admitted. Cln left message on April's confidential VM stating dates of attendance for pt in [place] were 12/01/20, 6/27, 6/28, 6/29, 6/30, 7/1, 7/5, 7/7, 7/8, 7/12, 7/14, 7/15, 7/20, and 12/28/20. Cln stated program is M-F and that time period also included closure for the Fourth of July. Cln stated program was virtual when pt attended and was conducted through live video chat. Cln left call back number of [phone] should there be other questions.

## 2021-05-15 NOTE — Telephone Encounter (Signed)
Cln returned call from [employer] re: pt's STD claim from this summer when in [facility]. Pt granted [place] permission to handle STD and speak to who is necessary upon admission to [facility] Case manager for claim, April, left message requesting confirmation of dates of service pt was in [place], if pt went to IOP and those dates of service, and if pt is still in our services.  Cln left message on April's confidential VM stating dates of service for pt in PHP were 12/01/20 - 01/01/21. Cln states pt did not engage in IOP although it was part of the expected treatment plan at one point. Treatment plan changed partly due to COVID illness in pt's household. Cln stated that pt is not currently and has not been in our services since discharge from [facility]. Cln left call back number of [phone] should there be other questions.

## 2021-12-16 ENCOUNTER — Encounter: Payer: Self-pay | Admitting: Emergency Medicine

## 2021-12-16 ENCOUNTER — Ambulatory Visit
Admission: EM | Admit: 2021-12-16 | Discharge: 2021-12-16 | Disposition: A | Discharge status: Home / Self Care | Payer: Commercial Managed Care - PPO | Attending: Urgent Care | Admitting: Urgent Care

## 2021-12-16 DIAGNOSIS — K645 Perianal venous thrombosis: Secondary | ICD-10-CM

## 2021-12-16 MED ORDER — HYDROCODONE-ACETAMINOPHEN 5-325 MG PO TABS: 1.0000 | ORAL_TABLET | Freq: Four times a day (QID) | ORAL | 0 refills | Status: AC | PRN

## 2021-12-16 NOTE — ED Provider Notes (Signed)
[place]   MRN: [id number] DOB: Oct 09, 1975  Subjective:   [name] is a 46 y.o. female presenting for 3-day history of acute onset severe 10 out of 10 pain over the perianal area.  Patient notes that she has palpated a mass.  Has been using Preparation H, lidocaine ointment, Epsom salts and nothing is helping.  Does not suffer from constipation, however she does take stool softener daily.  No current facility-administered medications for this encounter.  Current Outpatient Medications:    acetaminophen (TYLENOL) 650 MG CR tablet, Take 1,300 mg by mouth every 8 (eight) hours as needed for pain., Disp: , Rfl:    Cholecalciferol (VITAMIN D) 50 MCG (2000 UT) tablet, Take 2,000 Units by mouth every Sunday., Disp: , Rfl:    hydrOXYzine (VISTARIL) 25 MG capsule, Take 1 capsule (25 mg total) by mouth 3 (three) times daily as needed., Disp: 30 capsule, Rfl: 0   ondansetron (ZOFRAN ODT) 8 MG disintegrating tablet, Take 1 tablet (8 mg total) by mouth every 8 (eight) hours as needed for nausea or vomiting., Disp: 20 tablet, Rfl: 0   promethazine-dextromethorphan (PROMETHAZINE-DM) 6.25-15 MG/5ML syrup, Take 5 mLs by mouth 4 (four) times daily as needed for cough., Disp: 118 mL, Rfl: 0   sertraline (ZOLOFT) 25 MG tablet, Take 3 tablets (75 mg total) by mouth daily., Disp: 90 tablet, Rfl: 2   traZODone (DESYREL) 50 MG tablet, Take 1 tablet (50 mg total) by mouth at bedtime as needed for sleep., Disp: 60 tablet, Rfl: 0   Allergies  Allergen Reactions   Iodinated Contrast Media Other (See Comments) and Nausea And Vomiting    Pt became hypotensive,lightheaded,near syncopal the last 2 times she had contrast( prior to 2007)////////NEW UPDATE, COUGHING, TROUBLE BREATHING AND HIVES PER PT//A.[place]   Morphine And Related Itching    Past Medical History:  Diagnosis Date   Cervical dysplasia 06/11/1991   Depression    Migraine    Obesity    Sleep apnea    Uses CPAP      Past Surgical History:  Procedure Laterality Date   CESAREAN SECTION     x 4   COLONOSCOPY N/A 12/26/2020   Procedure: COLONOSCOPY;  Surgeon: Wilford Corner, MD;  Location: [facility];  Service: Endoscopy;  Laterality: N/A;   COLPOSCOPY     TUBAL LIGATION Bilateral 9518   UMBILICAL HERNIA REPAIR  2007    Family History  Problem Relation Age of Onset   Ovarian cancer Mother 43   Heart failure Mother    Depression Mother    Cancer Mother        uterine cancer    Heart failure Father 8       MI   Diabetes Father        type 2   Hypertension Father    Heart disease Sister     Social History   Tobacco Use   Smoking status: Every Day    Packs/day: 0.50    Types: Cigarettes   Smokeless tobacco: Never  Substance Use Topics   Alcohol use: No    Alcohol/week: 0.0 standard drinks of alcohol   Drug use: No    ROS   Objective:   Vitals: BP 128/87 (BP Location: Left Arm)   Pulse 90   Temp 98.5 F (36.9 C) (Oral)   Resp 16   SpO2 97%   Physical Exam Exam conducted with a chaperone present Roselyn Reef).  Constitutional:  General: She is not in acute distress.    Appearance: Normal appearance. She is well-developed. She is not ill-appearing, toxic-appearing or diaphoretic.  HENT:     Head: Normocephalic and atraumatic.     Nose: Nose normal.     Mouth/Throat:     Mouth: Mucous membranes are moist.  Eyes:     General: No scleral icterus.       Right eye: No discharge.        Left eye: No discharge.     Extraocular Movements: Extraocular movements intact.  Cardiovascular:     Rate and Rhythm: Normal rate.  Pulmonary:     Effort: Pulmonary effort is normal.  Genitourinary:   Skin:    General: Skin is warm and dry.  Neurological:     General: No focal deficit present.     Mental Status: She is alert and oriented to person, place, and time.  Psychiatric:        Mood and Affect: Mood normal.        Behavior: Behavior normal.     Assessment and  Plan :   PDMP not reviewed this encounter.  1. External hemorrhoid, thrombosed    Severe large external hemorrhoid.  Unfortunately I am not capable of performing this type of hemorrhoidectomy.  Recommended control of her pain with hydrocodone, continue using Epsom salts and topical therapy.  Follow-up with [facility] surgery as soon as possible tomorrow. Counseled patient on potential for adverse effects with medications prescribed/recommended today, ER and return-to-clinic precautions discussed, patient verbalized understanding.    Jaynee Eagles, [place] 12/16/21 6945

## 2021-12-16 NOTE — Discharge Instructions (Addendum)
Please make sure that you follow up with [facility] as soon as possible tomorrow. They need to help you with your severe large external hemorrhoid. In the meantime, you can use hydrocodone for your severe pain.  Please continue to use your stool softener twice daily.  Make sure you are eating foods high in fiber and hydrate very well.  Continue doing the Epsom soaks.  Your hemorrhoid may rupture.  And if this happens, please make sure that you rolled up a thick wad of gauze and apply it directly to the area, secure it wearing very tight underwear.  If you continue to have major bleeding then get to the emergency room.

## 2021-12-16 NOTE — ED Triage Notes (Signed)
Rectal pain x 3 days, treating with hemorrhoid medication. Last night pain woke her up from sleep with a stabbing rectal pain. Soaked in epsom salt, tried lidocaine ointment. States she feels like something is spreading from rectal area into vagina, very swollen and sore.

## 2022-10-02 IMAGING — MG DIGITAL SCREENING BILAT W/ TOMO W/ CAD
6 of 12 series · 6 of 36 positions shown · non-contrast
Comparison: Previous exam(s).

ACR Breast Density Category a: The breast tissue is almost entirely
fatty.

CLINICAL DATA: Screening.

EXAM:
DIGITAL SCREENING BILATERAL MAMMOGRAM WITH TOMO AND CAD

[L CC synth-2D (1 of 2)]
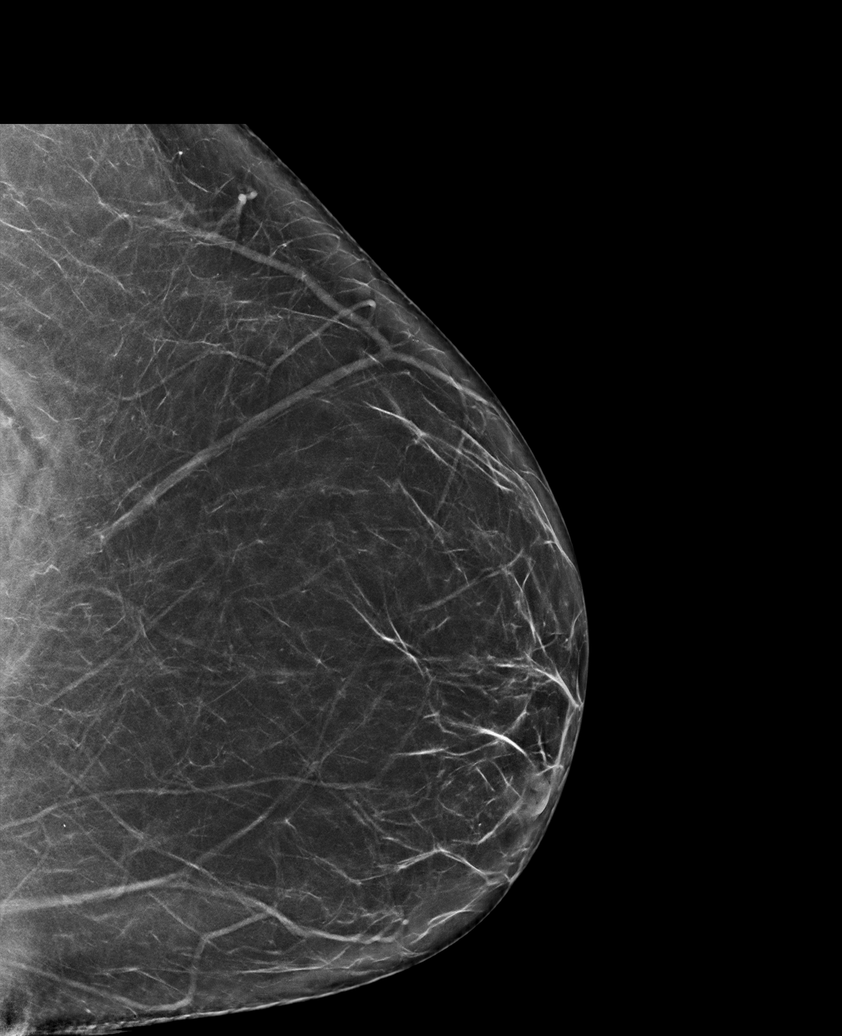

[R CC synth-2D]
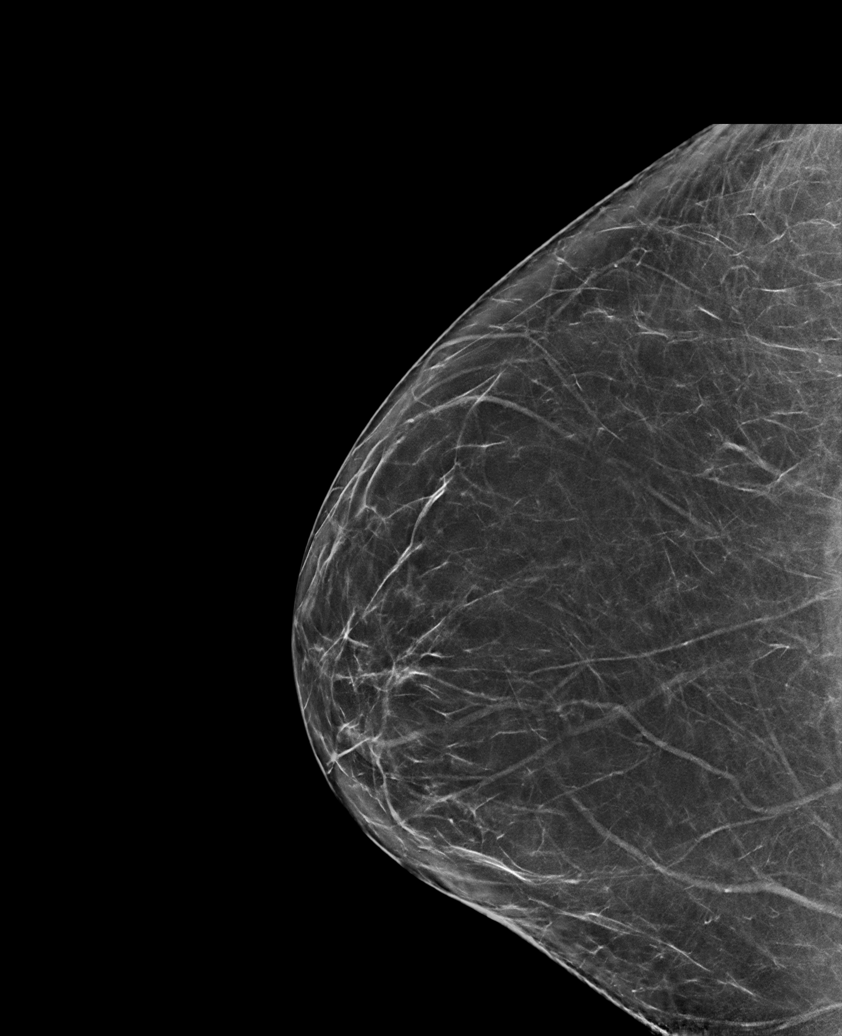

[L CC synth-2D (2 of 2)]
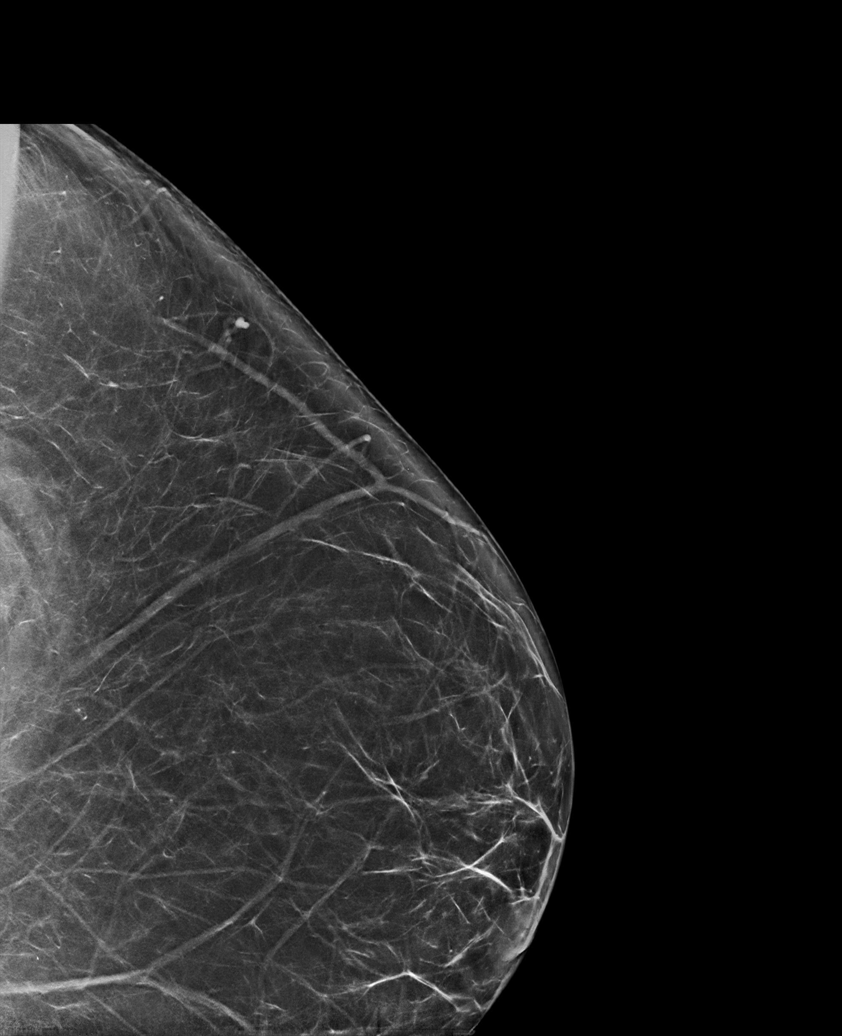

[R MLO synth-2D]
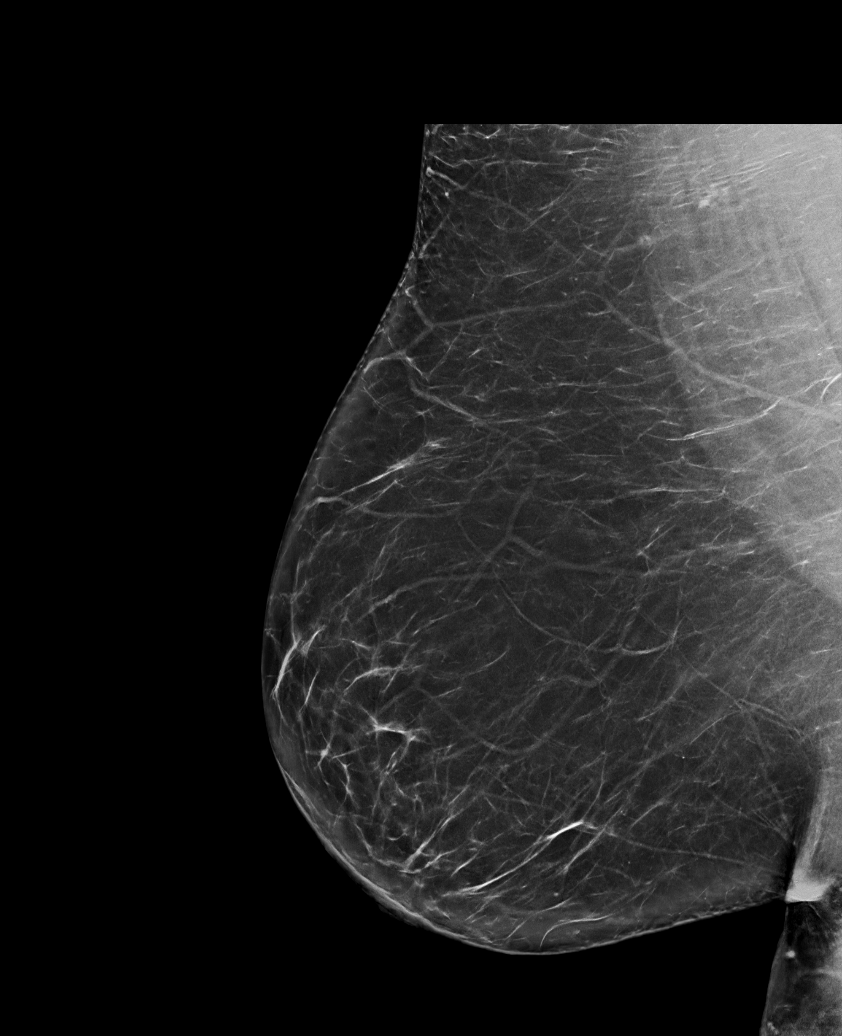

[L MLO synth-2D]
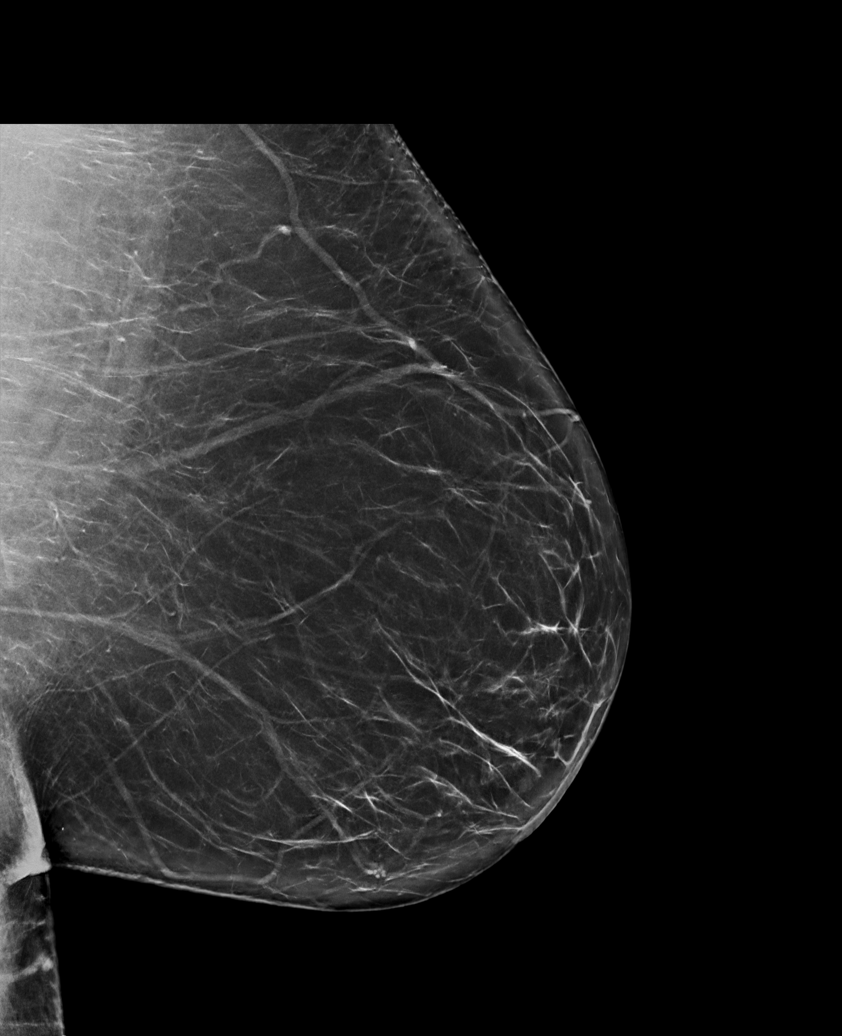

[R CV synth-2D]
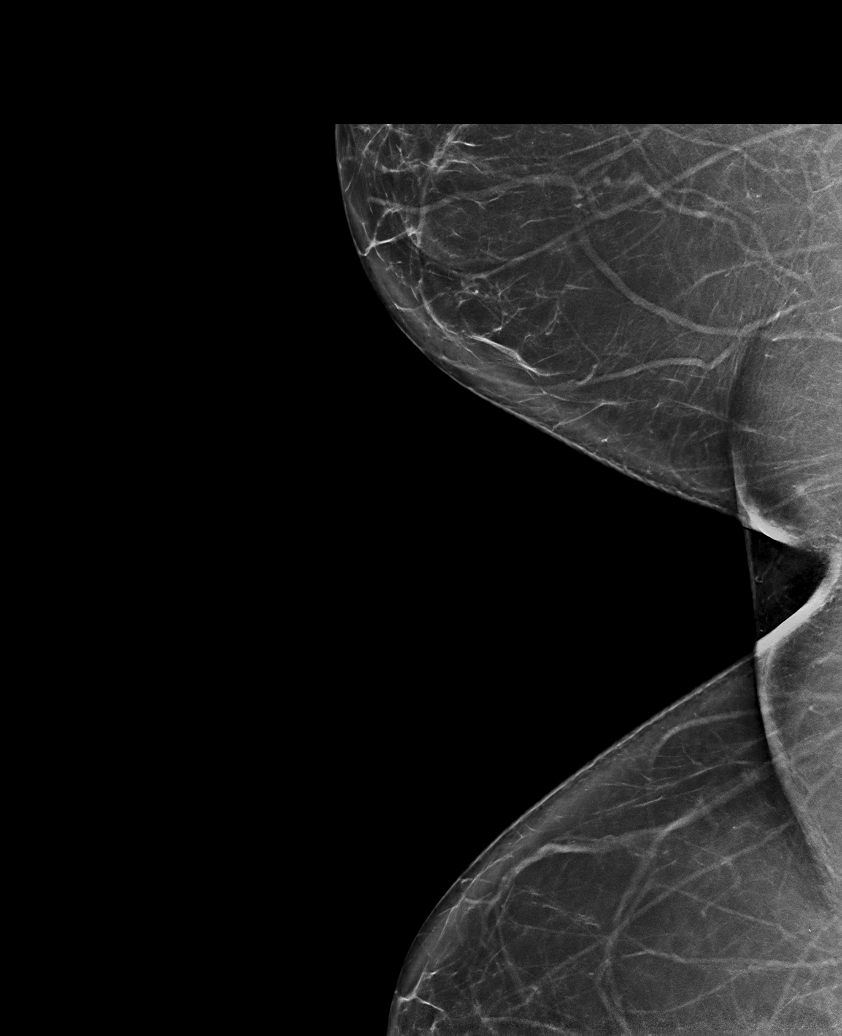

[6 of 36 positions shown; findings below may reference images not displayed]

FINDINGS: There are no findings suspicious for malignancy. Images were
processed with CAD.
IMPRESSION: No mammographic evidence of malignancy. A result letter of this
screening mammogram will be mailed directly to the patient.

RECOMMENDATION:
Screening mammogram in one year. (Code:[id number])

BI-RADS CATEGORY  1: Negative.

## 2023-02-11 ENCOUNTER — Ambulatory Visit
Admission: RE | Admit: 2023-02-11 | Discharge: 2023-02-11 | Disposition: A | Discharge status: Home / Self Care | Payer: Commercial Managed Care - PPO | Source: Ambulatory Visit | Attending: Internal Medicine | Admitting: Internal Medicine

## 2023-02-11 ENCOUNTER — Other Ambulatory Visit: Payer: Self-pay

## 2023-02-11 VITALS — BP 130/86 | HR 94 | Temp 97.9°F | Resp 20

## 2023-02-11 DIAGNOSIS — J069 Acute upper respiratory infection, unspecified: Secondary | ICD-10-CM | POA: Diagnosis present

## 2023-02-11 DIAGNOSIS — Z20822 Contact with and (suspected) exposure to covid-19: Secondary | ICD-10-CM | POA: Diagnosis not present

## 2023-02-11 DIAGNOSIS — R053 Chronic cough: Secondary | ICD-10-CM | POA: Diagnosis not present

## 2023-02-11 MED ORDER — BENZONATATE 100 MG PO CAPS
100.0000 mg | ORAL_CAPSULE | Freq: Three times a day (TID) | ORAL | 0 refills | Status: AC | PRN
Start: 2023-02-11 — End: ?

## 2023-02-11 MED ORDER — AMOXICILLIN-POT CLAVULANATE 875-125 MG PO TABS: 1.0000 | ORAL_TABLET | Freq: Two times a day (BID) | ORAL | 0 refills | Status: DC

## 2023-02-11 NOTE — ED Triage Notes (Signed)
Pt sts URI sx x 10 days with cough and congestion that is now worse; OTC meds not helping

## 2023-02-11 NOTE — Discharge Instructions (Signed)
I have sent you an antibiotic to treat upper respiratory infection as well as a cough medication.  COVID test is pending.

## 2023-02-11 NOTE — ED Provider Notes (Signed)
[name]    [name]: [id number] Arrival date & time: 02/11/23  1003      History   Chief Complaint Chief Complaint  Patient presents with   Letter for School/Work    Have been having flu/ Covid symptoms for last 8 days and been taking otc medicine doesn't seem to be helping - Entered by patient   Cough    HPI [name] is a 47 y.o. female.   Patient presents with 10-day history of cough and nasal congestion.  Reports that she has been taking Tylenol Cold and flu with no improvement of symptoms.  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  Denies history of asthma or COPD.   Cough   Past Medical History:  Diagnosis Date   Cervical dysplasia 06/11/1991   Depression    Migraine    Obesity    Sleep apnea    Uses CPAP    Patient Active Problem List   Diagnosis Date Noted   Personal history of colonic polyps 12/26/2020   Exercise-induced asthma 07/21/2018   Exertional dyspnea 12/15/2017   Elevated troponin    Mild obstructive sleep apnea 02/14/2015   Snoring 11/14/2014   Inguinal adenopathy 10/31/2014   Positive D dimer 10/17/2014   Pedal edema 10/14/2014   Fatigue 10/14/2014   Morbid obesity (HCC) 08/07/2006   TOBACCO DEPENDENCE 08/07/2006   MIGRAINE, UNSPEC., W/O INTRACTABLE MIGRAINE 08/07/2006   RHINITIS, ALLERGIC 08/07/2006    Past Surgical History:  Procedure Laterality Date   CESAREAN SECTION     x 4   COLONOSCOPY N/A 12/26/2020   Procedure: COLONOSCOPY;  Surgeon: Charlott Rakes, MD;  Location: [facility];  Service: Endoscopy;  Laterality: N/A;   COLPOSCOPY     TUBAL LIGATION Bilateral 2006   UMBILICAL HERNIA REPAIR  2007    OB History     Gravida  6   Para  4   Term  4   Preterm      AB  2   Living  4      SAB  1   IAB  1   Ectopic      Multiple      Live Births  4            Home Medications    Prior to  Admission medications   Medication Sig Start Date End Date Taking? Authorizing Provider  amoxicillin-clavulanate (AUGMENTIN) 875-125 MG tablet Take 1 tablet by mouth every 12 (twelve) hours. 02/11/23  Yes , Rolly Salter E, FNP  benzonatate (TESSALON) 100 MG capsule Take 1 capsule (100 mg total) by mouth every 8 (eight) hours as needed for cough. 02/11/23  Yes , Rolly Salter E, FNP  acetaminophen (TYLENOL) 650 MG CR tablet Take 1,300 mg by mouth every 8 (eight) hours as needed for pain.    [provider]  Cholecalciferol (VITAMIN D) 50 MCG (2000 UT) tablet Take 2,000 Units by mouth every Sunday.    [provider]  HYDROcodone-acetaminophen (NORCO/VICODIN) 5-325 MG tablet Take 1 tablet by mouth every 6 (six) hours as needed for severe pain. 12/16/21   Wallis Bamberg, PA-C  hydrOXYzine (VISTARIL) 25 MG capsule Take 1 capsule (25 mg total) by mouth 3 (three) times daily as needed. 12/29/20   Oneta Rack, NP  ondansetron (ZOFRAN ODT) 8 MG disintegrating tablet Take 1 tablet (8 mg total) by mouth every 8 (eight) hours  as needed for nausea or vomiting. 01/02/21   Rhys Martini, PA-C  promethazine-dextromethorphan (PROMETHAZINE-DM) 6.25-15 MG/5ML syrup Take 5 mLs by mouth 4 (four) times daily as needed for cough. 01/02/21   Rhys Martini, PA-C  sertraline (ZOLOFT) 25 MG tablet Take 3 tablets (75 mg total) by mouth daily. 12/27/20 12/27/21  Oneta Rack, NP  traZODone (DESYREL) 50 MG tablet Take 1 tablet (50 mg total) by mouth at bedtime as needed for sleep. 12/29/20   Oneta Rack, NP    Family History Family History  Problem Relation Age of Onset   Ovarian cancer Mother 32   Heart failure Mother    Depression Mother    Cancer Mother        uterine cancer    Heart failure Father 44       MI   Diabetes Father        type 2   Hypertension Father    Heart disease Sister     Social History Social History   Tobacco Use   Smoking status: Every Day    Current packs/day: 0.50     Types: Cigarettes   Smokeless tobacco: Never  Substance Use Topics   Alcohol use: No    Alcohol/week: 0.0 standard drinks of alcohol   Drug use: No     Allergies   Iodinated contrast media and Morphine and codeine   Review of Systems Review of Systems Per HPI  Physical Exam Triage Vital Signs ED Triage Vitals  Encounter Vitals Group     BP 02/11/23 1017 130/86     Systolic BP Percentile --      Diastolic BP Percentile --      Pulse Rate 02/11/23 1017 94     Resp 02/11/23 1017 20     Temp 02/11/23 1017 97.9 F (36.6 C)     Temp Source 02/11/23 1017 Oral     SpO2 02/11/23 1017 99 %     Weight --      Height --      Head Circumference --      Peak Flow --      Pain Score 02/11/23 1028 5     Pain Loc --      Pain Education --      Exclude from Growth Chart --    No data found.  Updated Vital Signs BP 130/86 (BP Location: Left Arm)   Pulse 94   Temp 97.9 F (36.6 C) (Oral)   Resp 20   SpO2 99%   Visual Acuity Right Eye Distance:   Left Eye Distance:   Bilateral Distance:    Right Eye Near:   Left Eye Near:    Bilateral Near:     Physical Exam Constitutional:      General: She is not in acute distress.    Appearance: Normal appearance. She is not toxic-appearing or diaphoretic.  HENT:     Head: Normocephalic and atraumatic.     Right Ear: Tympanic membrane and ear canal normal.     Left Ear: Tympanic membrane and ear canal normal.     Nose: Congestion present.     Mouth/Throat:     Mouth: Mucous membranes are moist.     Pharynx: No posterior oropharyngeal erythema.  Eyes:     Extraocular Movements: Extraocular movements intact.     Conjunctiva/sclera: Conjunctivae normal.     Pupils: Pupils are equal, round, and reactive to light.  Cardiovascular:     Rate  and Rhythm: Normal rate and regular rhythm.     Pulses: Normal pulses.     Heart sounds: Normal heart sounds.  Pulmonary:     Effort: Pulmonary effort is normal. No respiratory distress.      Breath sounds: Normal breath sounds. No stridor. No wheezing, rhonchi or rales.  Abdominal:     General: Abdomen is flat. Bowel sounds are normal.     Palpations: Abdomen is soft.  Musculoskeletal:        General: Normal range of motion.     Cervical back: Normal range of motion.  Skin:    General: Skin is warm and dry.  Neurological:     General: No focal deficit present.     Mental Status: She is alert and oriented to person, place, and time. Mental status is at baseline.  Psychiatric:        Mood and Affect: Mood normal.        Behavior: Behavior normal.      UC Treatments / Results  Labs (all labs ordered are listed, but only abnormal results are displayed) Labs Reviewed  SARS CORONAVIRUS 2 (TAT 6-24 HRS)    EKG   Radiology No results found.  Procedures Procedures (including critical care time)  Medications Ordered in UC Medications - No data to display  Initial Impression / Assessment and Plan / UC Course  I have reviewed the triage vital signs and the nursing notes.  Pertinent labs & imaging results that were available during my care of the patient were reviewed by me and considered in my medical decision making (see chart for details).     Given duration of symptoms, will opt to treat with antibiotics today to treat secondary bacterial infection versus sinus infection.  Augmentin prescribed for patient.  Also prescribed benzonatate to take as needed for cough.  There are no adventitious lung sounds on exam so do not think that chest imaging is necessary.  Patient requesting COVID testing.  Discussed that COVID testing would not change treatment given duration of symptoms but patient wants COVID testing today so this is pending.  Advised strict follow precautions and supportive care.  Patient verbalized understanding and was agreeable with plan. Final Clinical Impressions(s) / UC Diagnoses   Final diagnoses:  Acute upper respiratory infection  Persistent cough   Encounter for laboratory testing for COVID-19 virus     Discharge Instructions      I have sent you an antibiotic to treat upper respiratory infection as well as a cough medication.  COVID test is pending.    ED Prescriptions     Medication Sig Dispense Auth. Provider   amoxicillin-clavulanate (AUGMENTIN) 875-125 MG tablet Take 1 tablet by mouth every 12 (twelve) hours. 14 tablet [place], [place], [place]   benzonatate (TESSALON) 100 MG capsule Take 1 capsule (100 mg total) by mouth every 8 (eight) hours as needed for cough. 21 capsule [place], Acie Fredrickson, [place]      PDMP not reviewed this encounter.   Gustavus Bryant, [place] 02/11/23 1135

## 2023-02-12 LAB — SARS CORONAVIRUS 2 (TAT 6-24 HRS): SARS Coronavirus 2: NEGATIVE

## 2023-05-29 ENCOUNTER — Other Ambulatory Visit: Payer: Self-pay

## 2023-05-29 ENCOUNTER — Ambulatory Visit: Payer: Commercial Managed Care - PPO | Attending: Orthopedic Surgery

## 2023-05-29 DIAGNOSIS — R252 Cramp and spasm: Secondary | ICD-10-CM | POA: Diagnosis present

## 2023-05-29 DIAGNOSIS — M6281 Muscle weakness (generalized): Secondary | ICD-10-CM | POA: Diagnosis present

## 2023-05-29 DIAGNOSIS — M79604 Pain in right leg: Secondary | ICD-10-CM | POA: Insufficient documentation

## 2023-05-29 DIAGNOSIS — R262 Difficulty in walking, not elsewhere classified: Secondary | ICD-10-CM | POA: Diagnosis present

## 2023-05-29 NOTE — Therapy (Signed)
OUTPATIENT PHYSICAL THERAPY LOWER EXTREMITY EVALUATION   Patient Name: [name] MRN: [id number] DOB:[birth year], 47 y.o., female Today's Date: 05/29/2023  END OF SESSION:  PT End of Session - 05/29/23 1212     Visit Number 1    Date for PT Re-Evaluation 07/24/23    Authorization Type [employer] and Norm Salt    PT Start Time 1211    PT Stop Time 1235    PT Time Calculation (min) 24 min             Past Medical History:  Diagnosis Date   Cervical dysplasia 06/11/1991   Depression    Migraine    Obesity    Sleep apnea    Uses CPAP   Past Surgical History:  Procedure Laterality Date   CESAREAN SECTION     x 4   COLONOSCOPY N/A 12/26/2020   Procedure: COLONOSCOPY;  Surgeon: Charlott Rakes, MD;  Location: [facility];  Service: Endoscopy;  Laterality: N/A;   COLPOSCOPY     TUBAL LIGATION Bilateral 2006   UMBILICAL HERNIA REPAIR  2007   Patient Active Problem List   Diagnosis Date Noted   History of colonic polyps 12/26/2020   Exercise-induced asthma 07/21/2018   Exertional dyspnea 12/15/2017   Elevated troponin    Mild obstructive sleep apnea 02/14/2015   Snoring 11/14/2014   Inguinal adenopathy 10/31/2014   Positive D dimer 10/17/2014   Pedal edema 10/14/2014   Fatigue 10/14/2014   Morbid obesity (HCC) 08/07/2006   TOBACCO DEPENDENCE 08/07/2006   MIGRAINE, UNSPEC., W/O INTRACTABLE MIGRAINE 08/07/2006   RHINITIS, ALLERGIC 08/07/2006    PCP: Verlee Rossetti, PA-C   REFERRING PROVIDER: Dannielle Huh, MD  REFERRING DIAG: [id number] (ICD-10-CM) - Right leg pain  THERAPY DIAG:  Pain in right leg  Difficulty in walking, not elsewhere classified  Muscle weakness (generalized)  Cramp and spasm  Rationale for Evaluation and Treatment: Rehabilitation  ONSET DATE: 05/28/2023  SUBJECTIVE:   SUBJECTIVE STATEMENT: (Patient was 20 min late for appt)  Patient has experienced right leg pain for about 6 months but this has worsened to the point of her  falling 3 times in the past month.  She drives a city bus.  She has not been able to sleep well as she can't get into a comfortable position.  She states she only getting about 3 hours of sleep per night.  She has difficulty with steps and favors her left side when navigating steps.  When sitting, she shifts to the left to reduce the pain.    PERTINENT HISTORY: na  PAIN:  Are you having pain? Yes: NPRS scale: 6-7/10 Pain location: right hip and leg Pain description: aching Aggravating factors: trying to walk or trying to get to sleep Relieving factors: heating pad  PRECAUTIONS: None  RED FLAGS: None   WEIGHT BEARING RESTRICTIONS: No  FALLS:  Has patient fallen in last 6 months? Yes. 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  I sit and drive all day.    NEXT MD  VISIT: prn  OBJECTIVE:  Note: Objective measures were completed at Evaluation unless otherwise noted.  DIAGNOSTIC FINDINGS: none  PATIENT SURVEYS:  LEFS 13  COGNITION: Overall cognitive status: Within functional limits for tasks assessed     SENSATION: Patient reports leg pain and that her leg "falls asleep" but sensation to light touch is WNL   MUSCLE LENGTH: Hamstrings: Right 50 deg; Left 60 deg Thomas test: Right pos; Left pos  POSTURE: increased lumbar lordosis   LUMBAR ROM: Flexion: fingertips to mid shin Extension : WNL with pain in right low back Side bending right:  fingertips to just above joint line Side bending left: fingertips to joint line Rotation left and right: WNL  LOWER EXTREMITY ROM:  WFL  LOWER EXTREMITY MMT:  Right LE painful on resistive testing and generally 4-/5 Left LE 5/5   FUNCTIONAL TESTS:  5 times sit to stand: 16.78 sec Timed up and go (TUG): 11.66 sec  GAIT: Distance walked: 30 feet Assistive device utilized: None Level of assistance: Complete Independence Comments: antalgic   TODAY'S TREATMENT:                                                                                                                              DATE: 05/29/23 Initial eval completed and initiated HEP    PATIENT EDUCATION:  Education details: Initiated HEP Person educated: Patient Education method: Programmer, multimedia, Facilities manager, Verbal cues, and Handouts Education comprehension: verbalized understanding, returned demonstration, and verbal cues required  HOME EXERCISE PROGRAM: Will issue next visit.  Limited time with patient due to 20 min late for appt  ASSESSMENT:  CLINICAL IMPRESSION: Patient is a 47 y.o. female who was seen today for physical therapy evaluation and treatment for right leg pain. Her symptoms are consistent with probable lumbar stenosis or disc pathology.  We did some diagnostic prone progression but patient seemed to have some increase in leg pain.  However, this was more in the hip flexor area and since she is a bus driver doing 13-08 hour shifts, this would be likely due to hip flexor tightness.  Her LEFS score is very low but she has fair lumbar ROM with no sharp or severe pain when assessing this.  Our time was limited today.  She would benefit from skilled PT for LE flexibility, core strengthening, and manual techniques including dry needling to address below impairments.   OBJECTIVE IMPAIRMENTS: decreased ROM, decreased strength, hypomobility, increased fascial restrictions, increased muscle spasms, impaired  flexibility, impaired sensation, improper body mechanics, postural dysfunction, obesity, and pain.   ACTIVITY LIMITATIONS: carrying, lifting, bending, sitting, standing, squatting, sleeping, stairs, transfers, bed mobility, bathing, dressing, reach over head, hygiene/grooming, and caring for others  PARTICIPATION LIMITATIONS: meal prep, cleaning, laundry, driving, shopping, community activity, occupation, yard work, and church  PERSONAL FACTORS: Fitness, Profession, and 1-2 comorbidities: post weight loss sx, depression  are also affecting patient's functional outcome.   REHAB POTENTIAL: Good  CLINICAL DECISION MAKING: Stable/uncomplicated  EVALUATION COMPLEXITY: Low   GOALS: Goals reviewed with patient? Yes  SHORT TERM GOALS: Target date: 06/26/2023  Pain report to be no greater than 4/10  Baseline: Goal status: INITIAL  2.  Patient will be independent with initial HEP  Baseline:  Goal status: INITIAL   LONG TERM GOALS: Target date: 07/24/2023  Patient to report pain no greater than 2/10  Baseline:  Goal status: INITIAL  2.  Patient to be independent with advanced HEP  Baseline:  Goal status: INITIAL  3.  Patient to be able to sleep through the night  Baseline:  Goal status: INITIAL  4.  Patient to be able to resume work Baseline:  Goal status: INITIAL  5.  Patient to be able to bend, stoop and squat with pain no greater than 2/10  Baseline:  Goal status: INITIAL  6.  Patient to report 85% improvement in overall symptoms  Baseline:  Goal status: INITIAL   PLAN:  PT FREQUENCY: 1-2x/week  PT DURATION: 8 weeks  PLANNED INTERVENTIONS: 97110-Therapeutic exercises, 97530- Therapeutic activity, [id number]- Neuromuscular re-education, 97535- Self Care, 16109- Manual therapy, [id number]- Gait training, [phone]- Aquatic Therapy, [phone]- Splinting, 97014- Electrical stimulation (unattended), 97016- Vasopneumatic device, [id number]- Ultrasound, [id number]- Traction (mechanical), [id number]-  Ionotophoresis 4mg /ml Dexamethasone, Patient/Family education, Stair training, Taping, Dry Needling, Joint mobilization, Scar mobilization, Vestibular training, Visual/preceptual remediation/compensation, DME instructions, Cryotherapy, and Moist heat  PLAN FOR NEXT SESSION: Review HEP, Nustep, begin LE resistive training   Vernell Barrier, PT 05/29/2023, 4:51 PM

## 2023-06-12 ENCOUNTER — Ambulatory Visit: Payer: Commercial Managed Care - PPO

## 2023-06-12 ENCOUNTER — Telehealth: Payer: Commercial Managed Care - PPO | Admitting: Physician Assistant

## 2023-06-12 DIAGNOSIS — B9689 Other specified bacterial agents as the cause of diseases classified elsewhere: Secondary | ICD-10-CM | POA: Diagnosis not present

## 2023-06-12 DIAGNOSIS — J019 Acute sinusitis, unspecified: Secondary | ICD-10-CM

## 2023-06-12 MED ORDER — AMOXICILLIN-POT CLAVULANATE 875-125 MG PO TABS: 1.0000 | ORAL_TABLET | Freq: Two times a day (BID) | ORAL | 0 refills | Status: AC

## 2023-06-12 MED ORDER — IPRATROPIUM BROMIDE 0.03 % NA SOLN: 2.0000 | Freq: Two times a day (BID) | NASAL | 0 refills | Status: AC

## 2023-06-12 NOTE — Progress Notes (Signed)
 Virtual Visit Consent   [name], you are scheduled for a virtual visit with a [facility] provider today. Just as with appointments in the office, your consent must be obtained to participate. Your consent will be active for this visit and any virtual visit you may have with one of our providers in the next 365 days. If you have a MyChart account, a copy of this consent can be sent to you electronically.  As this is a virtual visit, video technology does not allow for your provider to perform a traditional examination. This may limit your provider's ability to fully assess your condition. If your provider identifies any concerns that need to be evaluated in person or the need to arrange testing (such as labs, EKG, etc.), we will make arrangements to do so. Although advances in technology are sophisticated, we cannot ensure that it will always work on either your end or our end. If the connection with a video visit is poor, the visit may have to be switched to a telephone visit. With either a video or telephone visit, we are not always able to ensure that we have a secure connection.  By engaging in this virtual visit, you consent to the provision of healthcare and authorize for your insurance to be billed (if applicable) for the services provided during this visit. Depending on your insurance coverage, you may receive a charge related to this service.  I need to obtain your verbal consent now. Are you willing to proceed with your visit today? Chalsey ALLIX BLOMQUIST has provided verbal consent on 06/12/2023 for a virtual visit (video or telephone). Delon CHRISTELLA Dickinson, PA-C  Date: 06/12/2023 3:06 PM  Virtual Visit via Video Note   I, Delon CHRISTELLA Dickinson, connected with  LEITHA HYPPOLITE  ([id number], 07-14-1975) on 06/12/23 at  3:00 PM EST by a video-enabled telemedicine application and verified that I am speaking with the correct person using two identifiers.  Location: Patient: Virtual Visit Location  Patient: Home Provider: Virtual Visit Location Provider: Home Office   I discussed the limitations of evaluation and management by telemedicine and the availability of in person appointments. The patient expressed understanding and agreed to proceed.    History of Present Illness: [name] is a 48 y.o. who identifies as a female who was assigned female at birth, and is being seen today for flu-like symptoms.  HPI: URI  This is a new problem. The current episode started 1 to 4 weeks ago (Started over a week ago on Christmas; had felt she was getting better, but then it worsened again last night). The problem has been gradually worsening. Maximum temperature: initial fevers, highest was 102 but improved with Tylenol  but now resolved. The fever has been present for 1 to 2 days. Associated symptoms include congestion, coughing, headaches, rhinorrhea (and post nasal drainage) and sinus pain. Pertinent negatives include no diarrhea, ear pain, nausea, plugged ear sensation, sore throat, swollen glands, vomiting or wheezing. She has tried acetaminophen  (Dayquil, nyquil; vicks in steam) for the symptoms. The treatment provided mild relief.     Problems:  Patient Active Problem List   Diagnosis Date Noted   History of colonic polyps 12/26/2020   Exercise-induced asthma 07/21/2018   Exertional dyspnea 12/15/2017   Elevated troponin    Mild obstructive sleep apnea 02/14/2015   Snoring 11/14/2014   Inguinal adenopathy 10/31/2014   Positive D dimer 10/17/2014   Pedal edema 10/14/2014   Fatigue 10/14/2014   Morbid obesity (HCC)  08/07/2006   TOBACCO DEPENDENCE 08/07/2006   MIGRAINE, UNSPEC., W/O INTRACTABLE MIGRAINE 08/07/2006   RHINITIS, ALLERGIC 08/07/2006    Allergies:  Allergies  Allergen Reactions   Iodinated Contrast Media Other (See Comments) and Nausea And Vomiting    Pt became hypotensive,lightheaded,near syncopal the last 2 times she had contrast( prior to 2007)////////NEW UPDATE,  COUGHING, TROUBLE BREATHING AND HIVES PER PT//A.CALHOUN   Morphine And Codeine Itching   Medications:  Current Outpatient Medications:    amoxicillin -clavulanate (AUGMENTIN ) 875-125 MG tablet, Take 1 tablet by mouth 2 (two) times daily., Disp: 14 tablet, Rfl: 0   ipratropium (ATROVENT ) 0.03 % nasal spray, Place 2 sprays into both nostrils every 12 (twelve) hours., Disp: 30 mL, Rfl: 0   acetaminophen  (TYLENOL ) 650 MG CR tablet, Take 1,300 mg by mouth every 8 (eight) hours as needed for pain., Disp: , Rfl:    benzonatate  (TESSALON ) 100 MG capsule, Take 1 capsule (100 mg total) by mouth every 8 (eight) hours as needed for cough., Disp: 21 capsule, Rfl: 0   Cholecalciferol (VITAMIN D ) 50 MCG (2000 UT) tablet, Take 2,000 Units by mouth every Sunday., Disp: , Rfl:    HYDROcodone -acetaminophen  (NORCO/VICODIN) 5-325 MG tablet, Take 1 tablet by mouth every 6 (six) hours as needed for severe pain., Disp: 15 tablet, Rfl: 0   hydrOXYzine  (VISTARIL ) 25 MG capsule, Take 1 capsule (25 mg total) by mouth 3 (three) times daily as needed., Disp: 30 capsule, Rfl: 0   ondansetron  (ZOFRAN  ODT) 8 MG disintegrating tablet, Take 1 tablet (8 mg total) by mouth every 8 (eight) hours as needed for nausea or vomiting., Disp: 20 tablet, Rfl: 0   promethazine -dextromethorphan (PROMETHAZINE -DM) 6.25-15 MG/5ML syrup, Take 5 mLs by mouth 4 (four) times daily as needed for cough., Disp: 118 mL, Rfl: 0   sertraline  (ZOLOFT ) 25 MG tablet, Take 3 tablets (75 mg total) by mouth daily., Disp: 90 tablet, Rfl: 2   traZODone  (DESYREL ) 50 MG tablet, Take 1 tablet (50 mg total) by mouth at bedtime as needed for sleep., Disp: 60 tablet, Rfl: 0  Observations/Objective: Patient is well-developed, well-nourished in no acute distress.  Resting comfortably at home.  Head is normocephalic, atraumatic.  No labored breathing.  Speech is clear and coherent with logical content.  Patient is alert and oriented at baseline.    Assessment and  Plan: 1. Acute bacterial sinusitis (Primary) - amoxicillin -clavulanate (AUGMENTIN ) 875-125 MG tablet; Take 1 tablet by mouth 2 (two) times daily.  Dispense: 14 tablet; Refill: 0 - ipratropium (ATROVENT ) 0.03 % nasal spray; Place 2 sprays into both nostrils every 12 (twelve) hours.  Dispense: 30 mL; Refill: 0  - Worsening symptoms that have not responded to OTC medications.  - Will give Augmentin  - Ipratropium bromide  nasal spray for drainage - Continue allergy medications.  - Steam and humidifier can help - Stay well hydrated and get plenty of rest.  - Seek in person evaluation if no symptom improvement or if symptoms worsen   Follow Up Instructions: I discussed the assessment and treatment plan with the patient. The patient was provided an opportunity to ask questions and all were answered. The patient agreed with the plan and demonstrated an understanding of the instructions.  A copy of instructions were sent to the patient via MyChart unless otherwise noted below.    The patient was advised to call back or seek an in-person evaluation if the symptoms worsen or if the condition fails to improve as anticipated.    Delon CHRISTELLA Dickinson, PA-C

## 2023-06-12 NOTE — Patient Instructions (Signed)
 [name], thank you for joining Delon CHRISTELLA Dickinson, PA-C for today's virtual visit.  While this provider is not your primary care provider (PCP), if your PCP is located in our provider database this encounter information will be shared with them immediately following your visit.   A [facility] MyChart account gives you access to today's visit and all your visits, tests, and labs performed at [facility]  click here if you don't have a [facility] MyChart account or go to [email or web address]  Consent: (Patient) [name] provided verbal consent for this virtual visit at the beginning of the encounter.  Current Medications:  Current Outpatient Medications:    amoxicillin -clavulanate (AUGMENTIN ) 875-125 MG tablet, Take 1 tablet by mouth 2 (two) times daily., Disp: 14 tablet, Rfl: 0   ipratropium (ATROVENT ) 0.03 % nasal spray, Place 2 sprays into both nostrils every 12 (twelve) hours., Disp: 30 mL, Rfl: 0   acetaminophen  (TYLENOL ) 650 MG CR tablet, Take 1,300 mg by mouth every 8 (eight) hours as needed for pain., Disp: , Rfl:    benzonatate  (TESSALON ) 100 MG capsule, Take 1 capsule (100 mg total) by mouth every 8 (eight) hours as needed for cough., Disp: 21 capsule, Rfl: 0   Cholecalciferol (VITAMIN D ) 50 MCG (2000 UT) tablet, Take 2,000 Units by mouth every Sunday., Disp: , Rfl:    HYDROcodone -acetaminophen  (NORCO/VICODIN) 5-325 MG tablet, Take 1 tablet by mouth every 6 (six) hours as needed for severe pain., Disp: 15 tablet, Rfl: 0   hydrOXYzine  (VISTARIL ) 25 MG capsule, Take 1 capsule (25 mg total) by mouth 3 (three) times daily as needed., Disp: 30 capsule, Rfl: 0   ondansetron  (ZOFRAN  ODT) 8 MG disintegrating tablet, Take 1 tablet (8 mg total) by mouth every 8 (eight) hours as needed for nausea or vomiting., Disp: 20 tablet, Rfl: 0   promethazine -dextromethorphan (PROMETHAZINE -DM) 6.25-15 MG/5ML syrup, Take 5 mLs by mouth 4 (four) times daily as needed for cough.,  Disp: 118 mL, Rfl: 0   sertraline  (ZOLOFT ) 25 MG tablet, Take 3 tablets (75 mg total) by mouth daily., Disp: 90 tablet, Rfl: 2   traZODone  (DESYREL ) 50 MG tablet, Take 1 tablet (50 mg total) by mouth at bedtime as needed for sleep., Disp: 60 tablet, Rfl: 0   Medications ordered in this encounter:  Meds ordered this encounter  Medications   amoxicillin -clavulanate (AUGMENTIN ) 875-125 MG tablet    Sig: Take 1 tablet by mouth 2 (two) times daily.    Dispense:  14 tablet    Refill:  0    Supervising Provider:   LAMPTEY, PHILIP O [[id number]]   ipratropium (ATROVENT ) 0.03 % nasal spray    Sig: Place 2 sprays into both nostrils every 12 (twelve) hours.    Dispense:  30 mL    Refill:  0    Supervising Provider:   BLAISE ALEENE KIDD [[id number]]     *If you need refills on other medications prior to your next appointment, please contact your pharmacy*  Follow-Up: Call back or seek an in-person evaluation if the symptoms worsen or if the condition fails to improve as anticipated.  [facility] [phone]  Other Instructions Sinus Infection, Adult A sinus infection, also called sinusitis, is inflammation of your sinuses. Sinuses are hollow spaces in the bones around your face. Your sinuses are located: Around your eyes. In the middle of your forehead. Behind your nose. In your cheekbones. Mucus normally drains out of your sinuses. When your nasal tissues become  inflamed or swollen, mucus can become trapped or blocked. This allows bacteria, viruses, and fungi to grow, which leads to infection. Most infections of the sinuses are caused by a virus. A sinus infection can develop quickly. It can last for up to 4 weeks (acute) or for more than 12 weeks (chronic). A sinus infection often develops after a cold. What are the causes? This condition is caused by anything that creates swelling in the sinuses or stops mucus from draining. This includes: Allergies. Asthma. Infection from  bacteria or viruses. Deformities or blockages in your nose or sinuses. Abnormal growths in the nose (nasal polyps). Pollutants, such as chemicals or irritants in the air. Infection from fungi. This is rare. What increases the risk? You are more likely to develop this condition if you: Have a weak body defense system (immune system). Do a lot of swimming or diving. Overuse nasal sprays. Smoke. What are the signs or symptoms? The main symptoms of this condition are pain and a feeling of pressure around the affected sinuses. Other symptoms include: Stuffy nose or congestion that makes it difficult to breathe through your nose. Thick yellow or greenish drainage from your nose. Tenderness, swelling, and warmth over the affected sinuses. A cough that may get worse at night. Decreased sense of smell and taste. Extra mucus that collects in the throat or the back of the nose (postnasal drip) causing a sore throat or bad breath. Tiredness (fatigue). Fever. How is this diagnosed? This condition is diagnosed based on: Your symptoms. Your medical history. A physical exam. Tests to find out if your condition is acute or chronic. This may include: Checking your nose for nasal polyps. Viewing your sinuses using a device that has a light (endoscope). Testing for allergies or bacteria. Imaging tests, such as an MRI or CT scan. In rare cases, a bone biopsy may be done to rule out more serious types of fungal sinus disease. How is this treated? Treatment for a sinus infection depends on the cause and whether your condition is chronic or acute. If caused by a virus, your symptoms should go away on their own within 10 days. You may be given medicines to relieve symptoms. They include: Medicines that shrink swollen nasal passages (decongestants). A spray that eases inflammation of the nostrils (topical intranasal corticosteroids). Rinses that help get rid of thick mucus in your nose (nasal saline  washes). Medicines that treat allergies (antihistamines). Over-the-counter pain relievers. If caused by bacteria, your health care provider may recommend waiting to see if your symptoms improve. Most bacterial infections will get better without antibiotic medicine. You may be given antibiotics if you have: A severe infection. A weak immune system. If caused by narrow nasal passages or nasal polyps, surgery may be needed. Follow these instructions at home: Medicines Take, use, or apply over-the-counter and prescription medicines only as told by your health care provider. These may include nasal sprays. If you were prescribed an antibiotic medicine, take it as told by your health care provider. Do not stop taking the antibiotic even if you start to feel better. Hydrate and humidify  Drink enough fluid to keep your urine pale yellow. Staying hydrated will help to thin your mucus. Use a cool mist humidifier to keep the humidity level in your home above 50%. Inhale steam for 10-15 minutes, 3-4 times a day, or as told by your health care provider. You can do this in the bathroom while a hot shower is running. Limit your exposure to cool  or dry air. Rest Rest as much as possible. Sleep with your head raised (elevated). Make sure you get enough sleep each night. General instructions  Apply a warm, moist washcloth to your face 3-4 times a day or as told by your health care provider. This will help with discomfort. Use nasal saline washes as often as told by your health care provider. Wash your hands often with soap and water to reduce your exposure to germs. If soap and water are not available, use hand sanitizer. Do not smoke. Avoid being around people who are smoking (secondhand smoke). Keep all follow-up visits. This is important. Contact a health care provider if: You have a fever. Your symptoms get worse. Your symptoms do not improve within 10 days. Get help right away if: You have a  severe headache. You have persistent vomiting. You have severe pain or swelling around your face or eyes. You have vision problems. You develop confusion. Your neck is stiff. You have trouble breathing. These symptoms may be an emergency. Get help right away. Call 911. Do not wait to see if the symptoms will go away. Do not drive yourself to the hospital. Summary A sinus infection is soreness and inflammation of your sinuses. Sinuses are hollow spaces in the bones around your face. This condition is caused by nasal tissues that become inflamed or swollen. The swelling traps or blocks the flow of mucus. This allows bacteria, viruses, and fungi to grow, which leads to infection. If you were prescribed an antibiotic medicine, take it as told by your health care provider. Do not stop taking the antibiotic even if you start to feel better. Keep all follow-up visits. This is important. This information is not intended to replace advice given to you by your health care provider. Make sure you discuss any questions you have with your health care provider. Document Revised: 05/01/2021 Document Reviewed: 05/01/2021 Elsevier Patient Education  2024 Elsevier Inc.    If you have been instructed to have an in-person evaluation today at a local Urgent Care facility, please use the link below. It will take you to a list of all of our available [facility], including address, phone number and hours of operation. Please do not delay care.  [facility]  If you or a family member do not have a primary care provider, use the link below to schedule a visit and establish care. When you choose a [facility] primary care physician or advanced practice provider, you gain a long-term partner in health. Find a Primary Care Provider  Learn more about [facility]'s in-office and virtual care options: [facility] - Get Care Now

## 2023-06-18 ENCOUNTER — Ambulatory Visit: Payer: Commercial Managed Care - PPO | Attending: Orthopedic Surgery

## 2023-06-18 DIAGNOSIS — R252 Cramp and spasm: Secondary | ICD-10-CM | POA: Diagnosis present

## 2023-06-18 DIAGNOSIS — R262 Difficulty in walking, not elsewhere classified: Secondary | ICD-10-CM | POA: Diagnosis present

## 2023-06-18 DIAGNOSIS — M79604 Pain in right leg: Secondary | ICD-10-CM

## 2023-06-18 DIAGNOSIS — R293 Abnormal posture: Secondary | ICD-10-CM | POA: Diagnosis present

## 2023-06-18 DIAGNOSIS — M6281 Muscle weakness (generalized): Secondary | ICD-10-CM

## 2023-06-18 NOTE — Therapy (Signed)
 OUTPATIENT PHYSICAL THERAPY LOWER EXTREMITY TREATMENT   Patient Name: [name] MRN: [id number] DOB:[birth year], 48 y.o., female Today's Date: 06/18/2023  END OF SESSION:  PT End of Session - 06/18/23 1244     Visit Number 2    Date for PT Re-Evaluation 07/24/23    Authorization Type [employer] and Donzella Dux    PT Start Time 1240    PT Stop Time 1318    PT Time Calculation (min) 38 min    Activity Tolerance Patient limited by pain    Behavior During Therapy [facility] for tasks assessed/performed             Past Medical History:  Diagnosis Date   Cervical dysplasia 06/11/1991   Depression    Migraine    Obesity    Sleep apnea    Uses CPAP   Past Surgical History:  Procedure Laterality Date   CESAREAN SECTION     x 4   COLONOSCOPY N/A 12/26/2020   Procedure: COLONOSCOPY;  Surgeon: Dianna Specking, MD;  Location: [facility] ENDOSCOPY;  Service: Endoscopy;  Laterality: N/A;   COLPOSCOPY     TUBAL LIGATION Bilateral 2006   UMBILICAL HERNIA REPAIR  2007   Patient Active Problem List   Diagnosis Date Noted   History of colonic polyps 12/26/2020   Exercise-induced asthma 07/21/2018   Exertional dyspnea 12/15/2017   Elevated troponin    Mild obstructive sleep apnea 02/14/2015   Snoring 11/14/2014   Inguinal adenopathy 10/31/2014   Positive D dimer 10/17/2014   Pedal edema 10/14/2014   Fatigue 10/14/2014   Morbid obesity (HCC) 08/07/2006   TOBACCO DEPENDENCE 08/07/2006   MIGRAINE, UNSPEC., W/O INTRACTABLE MIGRAINE 08/07/2006   RHINITIS, ALLERGIC 08/07/2006    PCP: Loris Elsie PARAS, PA-C   REFERRING PROVIDER: Rubie Kemps, MD  REFERRING DIAG: [phone] (ICD-10-CM) - Right leg pain  THERAPY DIAG:  Pain in right leg  Difficulty in walking, not elsewhere classified  Muscle weakness (generalized)  Cramp and spasm  Abnormal posture  Rationale for Evaluation and Treatment: Rehabilitation  ONSET DATE: 05/28/2023  SUBJECTIVE:   SUBJECTIVE STATEMENT: (Patient  was 10 min late for appt)  Patient reports she was sick and in bed for several days since her last visit.    She states she is a little better but just unable to do much since she was sick.  She tried to keep her hips loosened up at least.    PERTINENT HISTORY: na  PAIN:  06/18/23 Are you having pain? Yes: NPRS scale: 5.5/10 Pain location: right hip and leg Pain description: aching Aggravating factors: trying to walk or trying to get to sleep Relieving factors: heating pad  PRECAUTIONS: None  RED FLAGS: None   WEIGHT BEARING RESTRICTIONS: No  FALLS:  Has patient fallen in last 6 months? Yes. 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  I sit and drive all day.    NEXT MD VISIT: prn  OBJECTIVE:  Note: Objective measures were completed at Evaluation unless otherwise noted.  DIAGNOSTIC FINDINGS: none  PATIENT SURVEYS:  LEFS 13  COGNITION: Overall cognitive status: Within functional limits for tasks assessed     SENSATION: Patient reports leg pain and that her leg falls asleep but sensation to light touch is WNL   MUSCLE LENGTH: Hamstrings: Right 50 deg; Left 60 deg Thomas test: Right pos; Left pos  POSTURE: increased lumbar lordosis   LUMBAR ROM: Flexion: fingertips to mid shin Extension : WNL with pain in right low back Side bending right: fingertips to just above joint line Side bending left: fingertips to joint line Rotation left  and right: WNL  LOWER EXTREMITY ROM:  WFL  LOWER EXTREMITY MMT:  Right LE painful on resistive testing and generally 4-/5 Left LE 5/5   FUNCTIONAL TESTS:  5 times sit to stand: 16.78 sec Timed up and go (TUG): 11.66 sec  GAIT: Distance walked: 30 feet Assistive device utilized: None Level of assistance: Complete Independence Comments: antalgic   TODAY'S TREATMENT:                                                                                                                              DATE: 06/18/23 Nustep x 5 min level 5 Seated piriformis stretch 3 x 30 sec each LE Supine IT band stretch 3 x 30 sec right only Supine SLR 2 x 10 both Side lying hip abduction x 10 both Prone hip extension 2 x 10 both Hook lying clam 2 x 10 with yellow loop Side lying clam (no resistance) 2 x 10 both   DATE: 05/29/23 Initial eval completed and initiated HEP    PATIENT EDUCATION:  Education details: Initiated HEP Person educated: Patient Education method: Programmer, Multimedia, Facilities Manager, Verbal cues, and Handouts Education comprehension: verbalized understanding, returned demonstration, and verbal cues required  HOME EXERCISE PROGRAM: Will issue next visit.  Limited time with patient due to 20 min late for appt  ASSESSMENT:  CLINICAL IMPRESSION: Patient demonstrates extreme hip weakness throughout session today.  She needed frequent rest breaks with all exercises and reported pain with right hip abduction and SLR.  Dry needling was discussed.  Patient would like to hold on this and see how she does with the strengthening.  She would benefit from continuing skilled PT for focus on hip strengthening, LE flexibility, core strengthening, and manual techniques including dry needling, if indicated and patient agrees, to address below impairments.   OBJECTIVE IMPAIRMENTS: decreased ROM, decreased strength, hypomobility, increased fascial restrictions, increased muscle spasms, impaired  flexibility, impaired sensation, improper body mechanics, postural dysfunction, obesity, and pain.   ACTIVITY LIMITATIONS: carrying, lifting, bending, sitting, standing, squatting, sleeping, stairs, transfers, bed mobility, bathing, dressing, reach over head, hygiene/grooming, and caring for others  PARTICIPATION LIMITATIONS: meal prep, cleaning, laundry, driving, shopping, community activity, occupation, yard work, and church  PERSONAL FACTORS: Fitness, Profession, and 1-2 comorbidities: post weight loss sx, depression  are also affecting patient's functional outcome.   REHAB POTENTIAL: Good  CLINICAL DECISION MAKING: Stable/uncomplicated  EVALUATION COMPLEXITY:  Low   GOALS: Goals reviewed with patient? Yes  SHORT TERM GOALS: Target date: 06/26/2023  Pain report to be no greater than 4/10  Baseline: Goal status: INITIAL  2.  Patient will be independent with initial HEP  Baseline:  Goal status: INITIAL   LONG TERM GOALS: Target date: 07/24/2023  Patient to report pain no greater than 2/10  Baseline:  Goal status: INITIAL  2.  Patient to be independent with advanced HEP  Baseline:  Goal status: INITIAL  3.  Patient to be able to sleep through the night  Baseline:  Goal status: INITIAL  4.  Patient to be able to resume work Baseline:  Goal status: INITIAL  5.  Patient to be able to bend, stoop and squat with pain no greater than 2/10  Baseline:  Goal status: INITIAL  6.  Patient to report 85% improvement in overall symptoms  Baseline:  Goal status: INITIAL   PLAN:  PT FREQUENCY: 1-2x/week  PT DURATION: 8 weeks  PLANNED INTERVENTIONS: 97110-Therapeutic exercises, 97530- Therapeutic activity, [id number]- Neuromuscular re-education, 97535- Self Care, [id number]- Manual therapy, [id number]- Gait training, [phone]- Aquatic Therapy, [phone]- Splinting, 97014- Electrical stimulation (unattended), 97016- Vasopneumatic device, [id number]- Ultrasound, [id number]- Traction (mechanical), [id number]-  Ionotophoresis 4mg /ml Dexamethasone , Patient/Family education, Stair training, Taping, Dry Needling, Joint mobilization, Scar mobilization, Vestibular training, Visual/preceptual remediation/compensation, DME instructions, Cryotherapy, and Moist heat  PLAN FOR NEXT SESSION:  Nustep, focus on hip strength,  LE resistive training   Normand Damron B. Paelyn Smick, PT 06/18/23 8:48 PM [facility] [address] [place], [place] [address] Phone # [phone] Fax [phone]

## 2023-06-20 ENCOUNTER — Ambulatory Visit: Payer: Commercial Managed Care - PPO

## 2023-06-20 DIAGNOSIS — M6281 Muscle weakness (generalized): Secondary | ICD-10-CM

## 2023-06-20 DIAGNOSIS — R252 Cramp and spasm: Secondary | ICD-10-CM

## 2023-06-20 DIAGNOSIS — R293 Abnormal posture: Secondary | ICD-10-CM

## 2023-06-20 DIAGNOSIS — R262 Difficulty in walking, not elsewhere classified: Secondary | ICD-10-CM

## 2023-06-20 DIAGNOSIS — M79604 Pain in right leg: Secondary | ICD-10-CM

## 2023-06-20 NOTE — Therapy (Signed)
 OUTPATIENT PHYSICAL THERAPY LOWER EXTREMITY TREATMENT   Patient Name: [name] MRN: [id number] DOB:06/02/1976, 48 y.o., female Today's Date: 06/20/2023  END OF SESSION:  PT End of Session - 06/20/23 1111     Visit Number 3    Date for PT Re-Evaluation 07/24/23    Authorization Type [employer] and Donzella Dux    PT Start Time 1028    PT Stop Time 1110    PT Time Calculation (min) 42 min    Activity Tolerance Patient tolerated treatment well    Behavior During Therapy [facility] for tasks assessed/performed             Past Medical History:  Diagnosis Date   Cervical dysplasia 06/11/1991   Depression    Migraine    Obesity    Sleep apnea    Uses CPAP   Past Surgical History:  Procedure Laterality Date   CESAREAN SECTION     x 4   COLONOSCOPY N/A 12/26/2020   Procedure: COLONOSCOPY;  Surgeon: Dianna Specking, MD;  Location: [facility];  Service: Endoscopy;  Laterality: N/A;   COLPOSCOPY     TUBAL LIGATION Bilateral 2006   UMBILICAL HERNIA REPAIR  2007   Patient Active Problem List   Diagnosis Date Noted   History of colonic polyps 12/26/2020   Exercise-induced asthma 07/21/2018   Exertional dyspnea 12/15/2017   Elevated troponin    Mild obstructive sleep apnea 02/14/2015   Snoring 11/14/2014   Inguinal adenopathy 10/31/2014   Positive D dimer 10/17/2014   Pedal edema 10/14/2014   Fatigue 10/14/2014   Morbid obesity (HCC) 08/07/2006   TOBACCO DEPENDENCE 08/07/2006   MIGRAINE, UNSPEC., W/O INTRACTABLE MIGRAINE 08/07/2006   RHINITIS, ALLERGIC 08/07/2006    PCP: Loris Elsie PARAS, PA-C   REFERRING PROVIDER: Rubie Kemps, MD  REFERRING DIAG: [phone] (ICD-10-CM) - Right leg pain  THERAPY DIAG:  Pain in right leg  Difficulty in walking, not elsewhere classified  Muscle weakness (generalized)  Cramp and spasm  Abnormal posture  Rationale for Evaluation and Treatment: Rehabilitation  ONSET DATE: 05/28/2023  SUBJECTIVE:   SUBJECTIVE  STATEMENT: (Patient was 13 min late for appt)  Patient reports she is doing fairly well.  She explained that she had written her appts down for 10:20 am and apologized for being late.    PERTINENT HISTORY: na  PAIN:  06/20/23 Are you having pain? Yes: NPRS scale: 5/10 Pain location: right hip and leg Pain description: aching Aggravating factors: trying to walk or trying to get to sleep Relieving factors: heating pad  PRECAUTIONS: None  RED FLAGS: None   WEIGHT BEARING RESTRICTIONS: No  FALLS:  Has patient fallen in last 6 months? Yes. 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  I sit and drive all day.    NEXT MD VISIT: prn  OBJECTIVE:  Note: Objective measures were completed at Evaluation unless otherwise noted.  DIAGNOSTIC FINDINGS: none  PATIENT SURVEYS:  LEFS 13  COGNITION: Overall cognitive status: Within functional limits for tasks assessed  SENSATION: Patient reports leg pain and that her leg falls asleep but sensation to light touch is WNL   MUSCLE LENGTH: Hamstrings: Right 50 deg; Left 60 deg Thomas test: Right pos; Left pos  POSTURE: increased lumbar lordosis   LUMBAR ROM: Flexion: fingertips to mid shin Extension : WNL with pain in right low back Side bending right: fingertips to just above joint line Side bending left: fingertips to joint line Rotation left and right: WNL  LOWER EXTREMITY ROM:  WFL  LOWER  EXTREMITY MMT:  Right LE painful on resistive testing and generally 4-/5 Left LE 5/5   FUNCTIONAL TESTS:  5 times sit to stand: 16.78 sec Timed up and go (TUG): 11.66 sec  GAIT: Distance walked: 30 feet Assistive device utilized: None Level of assistance: Complete Independence Comments: antalgic   TODAY'S TREATMENT:                                                                                                                              DATE: 06/20/23 Nustep x 5 min level 5 Seated piriformis stretch 3 x 30 sec each LE Supine IT band stretch 3 x 30 sec bilateral today Supine SLR 2 x 10 both Side lying hip abduction x 10 both Prone hip extension 2 x 10 both Hook lying clam 2 x 10 with yellow loop Prone lying x 2 min  DATE: 06/18/23 Nustep x 5 min level 5 Seated piriformis stretch 3 x 30 sec each LE Supine IT band stretch 3 x 30 sec right only Supine SLR 2 x 10 both Side lying hip abduction x 10 both Prone hip extension 2 x 10 both Hook lying clam 2 x 10 with yellow loop Side lying clam (no resistance) 2 x 10 both   DATE: 05/29/23 Initial eval completed and initiated HEP    PATIENT EDUCATION:  Education details: Initiated HEP Person educated: Patient Education method: Programmer, Multimedia, Facilities Manager, Verbal cues, and Handouts Education comprehension: verbalized understanding, returned demonstration, and verbal cues required  HOME EXERCISE PROGRAM: Will issue next visit.  Limited time with patient due to 20 min late for appt  ASSESSMENT:  CLINICAL IMPRESSION: Patient is progressing appropriately.  She seems to have gotten her appt times written down wrong in her calendar.  She was able to complete all tasks today with minimal pain but did report some tingling in the toes.  We placed her in prone and this seems to have resolved.  She would benefit from continuing skilled PT for focus on hip strengthening, LE flexibility, core strengthening, and manual techniques including  dry needling, if indicated and patient agrees, to address below impairments.   OBJECTIVE IMPAIRMENTS: decreased ROM, decreased strength, hypomobility, increased fascial restrictions, increased muscle spasms, impaired flexibility, impaired sensation, improper body mechanics, postural dysfunction, obesity, and pain.   ACTIVITY LIMITATIONS: carrying, lifting, bending, sitting, standing, squatting, sleeping, stairs, transfers, bed mobility, bathing, dressing, reach over head, hygiene/grooming, and caring for others  PARTICIPATION LIMITATIONS:  meal prep, cleaning, laundry, driving, shopping, community activity, occupation, yard work, and church  PERSONAL FACTORS: Fitness, Profession, and 1-2 comorbidities: post weight loss sx, depression  are also affecting patient's functional outcome.   REHAB POTENTIAL: Good  CLINICAL DECISION MAKING: Stable/uncomplicated  EVALUATION COMPLEXITY: Low   GOALS: Goals reviewed with patient? Yes  SHORT TERM GOALS: Target date: 06/26/2023  Pain report to be no greater than 4/10  Baseline: Goal status: INITIAL  2.  Patient will be independent with initial HEP  Baseline:  Goal status: INITIAL   LONG TERM GOALS: Target date: 07/24/2023  Patient to report pain no greater than 2/10  Baseline:  Goal status: INITIAL  2.  Patient to be independent with advanced HEP  Baseline:  Goal status: INITIAL  3.  Patient to be able to sleep through the night  Baseline:  Goal status: INITIAL  4.  Patient to be able to resume work Baseline:  Goal status: INITIAL  5.  Patient to be able to bend, stoop and squat with pain no greater than 2/10  Baseline:  Goal status: INITIAL  6.  Patient to report 85% improvement in overall symptoms  Baseline:  Goal status: INITIAL   PLAN:  PT FREQUENCY: 1-2x/week  PT DURATION: 8 weeks  PLANNED INTERVENTIONS: 97110-Therapeutic exercises, 97530- Therapeutic activity, [id number]- Neuromuscular re-education, 97535- Self Care,  97140- Manual therapy, [id number]- Gait training, 02886- Aquatic Therapy, [phone]- Splinting, 02985- Electrical stimulation (unattended), 97016- Vasopneumatic device, [id number]- Ultrasound, [id number]- Traction (mechanical), [id number]- Ionotophoresis 4mg /ml Dexamethasone , Patient/Family education, Stair training, Taping, Dry Needling, Joint mobilization, Scar mobilization, Vestibular training, Visual/preceptual remediation/compensation, DME instructions, Cryotherapy, and Moist heat  PLAN FOR NEXT SESSION:  Nustep, focus on hip strength,  LE resistive training   Sarena Jezek B. Issabelle Mcraney, PT 06/20/23 11:16 AM [facility] [address], [address] [place], [place] [address] Phone # [phone] Fax [phone]

## 2023-06-24 ENCOUNTER — Ambulatory Visit: Payer: Commercial Managed Care - PPO

## 2023-06-24 DIAGNOSIS — M79604 Pain in right leg: Secondary | ICD-10-CM | POA: Diagnosis not present

## 2023-06-24 DIAGNOSIS — R293 Abnormal posture: Secondary | ICD-10-CM

## 2023-06-24 DIAGNOSIS — R262 Difficulty in walking, not elsewhere classified: Secondary | ICD-10-CM

## 2023-06-24 DIAGNOSIS — R252 Cramp and spasm: Secondary | ICD-10-CM

## 2023-06-24 DIAGNOSIS — M6281 Muscle weakness (generalized): Secondary | ICD-10-CM

## 2023-06-24 NOTE — Therapy (Signed)
 OUTPATIENT PHYSICAL THERAPY LOWER EXTREMITY TREATMENT   Patient Name: [name] MRN: [id number] DOB:[birth year], 48 y.o., female Today's Date: 06/24/2023  END OF SESSION:  PT End of Session - 06/24/23 1159     Visit Number 4    Date for PT Re-Evaluation 07/24/23    Authorization Type [employer] and [employer]ella Dux    PT Start Time 1150    PT Stop Time 1232    PT Time Calculation (min) 42 min    Activity Tolerance Patient tolerated treatment well    Behavior During Therapy [facility] for tasks assessed/performed             Past Medical History:  Diagnosis Date   Cervical dysplasia 06/11/1991   Depression    Migraine    Obesity    Sleep apnea    Uses CPAP   Past Surgical History:  Procedure Laterality Date   CESAREAN SECTION     x 4   COLONOSCOPY N/A 12/26/2020   Procedure: COLONOSCOPY;  Surgeon: Dianna Specking, MD;  Location: [facility];  Service: Endoscopy;  Laterality: N/A;   COLPOSCOPY     TUBAL LIGATION Bilateral 2006   UMBILICAL HERNIA REPAIR  2007   Patient Active Problem List   Diagnosis Date Noted   History of colonic polyps 12/26/2020   Exercise-induced asthma 07/21/2018   Exertional dyspnea 12/15/2017   Elevated troponin    Mild obstructive sleep apnea 02/14/2015   Snoring 11/14/2014   Inguinal adenopathy 10/31/2014   Positive D dimer 10/17/2014   Pedal edema 10/14/2014   Fatigue 10/14/2014   Morbid obesity (HCC) 08/07/2006   TOBACCO DEPENDENCE 08/07/2006   MIGRAINE, UNSPEC., W/O INTRACTABLE MIGRAINE 08/07/2006   RHINITIS, ALLERGIC 08/07/2006    PCP: Loris Elsie PARAS, PA-C   REFERRING PROVIDER: Rubie Kemps, MD  REFERRING DIAG: [phone] (ICD-10-CM) - Right leg pain  THERAPY DIAG:  Pain in right leg  Difficulty in walking, not elsewhere classified  Muscle weakness (generalized)  Cramp and spasm  Abnormal posture  Rationale for Evaluation and Treatment: Rehabilitation  ONSET DATE: 05/28/2023  SUBJECTIVE:   SUBJECTIVE  STATEMENT: Patient reports she is doing well today.  Her pain level is much improved.  She continues to feel a lot of weakness in her hips.  She especially feels weak in her right hip.    PERTINENT HISTORY: na  PAIN:  06/24/23 Are you having pain? Yes: NPRS scale: 0/10 Pain location: right hip and leg Pain description: aching Aggravating factors: trying to walk or trying to get to sleep Relieving factors: heating pad  PRECAUTIONS: None  RED FLAGS: None   WEIGHT BEARING RESTRICTIONS: No  FALLS:  Has patient fallen in last 6 months? Yes. 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  I sit and drive all day.    NEXT MD VISIT: prn  OBJECTIVE:  Note: Objective measures were completed at Evaluation unless otherwise noted.  DIAGNOSTIC FINDINGS: none  PATIENT SURVEYS:  LEFS 13  COGNITION: Overall cognitive status: Within functional limits for tasks assessed  SENSATION: Patient reports leg pain and that her leg falls asleep but sensation to light touch is WNL   MUSCLE LENGTH: Hamstrings: Right 50 deg; Left 60 deg Thomas test: Right pos; Left pos  POSTURE: increased lumbar lordosis   LUMBAR ROM: Flexion: fingertips to mid shin Extension : WNL with pain in right low back Side bending right: fingertips to just above joint line Side bending left: fingertips to joint line Rotation left and right: WNL  LOWER EXTREMITY ROM:  WFL  LOWER  EXTREMITY MMT:  Right LE painful on resistive testing and generally 4-/5 Left LE 5/5   FUNCTIONAL TESTS:  5 times sit to stand: 16.78 sec Timed up and go (TUG): 11.66 sec  GAIT: Distance walked: 30 feet Assistive device utilized: None Level of assistance: Complete Independence Comments: antalgic   TODAY'S TREATMENT:                                                                                                                              DATE: 06/24/23 Nustep x 5 min level 5 Supine hamstring stretch 2 x 30 sec each LE Supine IT band stretch 3 x 30 sec bilateral today Side lying hip abduction x 10 both Side lying clam without resistance Supine clam with red loop 2 x 10 Hip matrix : abduction and extension 2 x 10 each with 25 lbs  DATE: 06/20/23 Nustep x 5 min level 5 Seated piriformis stretch 3 x 30 sec each LE Supine IT band stretch 3 x 30 sec bilateral today Supine SLR 2 x 10 both Side lying hip abduction x 10 both Prone hip extension 2 x 10 both Hook lying clam 2 x 10 with yellow loop Prone lying x 2 min  DATE: 06/18/23 Nustep x 5 min level 5 Seated piriformis stretch 3 x 30 sec each LE Supine IT band stretch 3 x 30 sec right only Supine SLR 2 x 10 both Side lying hip abduction x 10 both Prone hip extension 2 x 10 both Hook lying clam 2 x 10 with yellow loop Side lying clam (no resistance) 2 x 10 both   DATE: 05/29/23 Initial eval completed and initiated HEP    PATIENT EDUCATION:  Education details: Initiated HEP Person educated: Patient Education method: Programmer, Multimedia, Facilities Manager, Verbal cues, and Handouts Education comprehension: verbalized understanding, returned demonstration, and verbal cues required  HOME EXERCISE PROGRAM: Access Code: [email or web address] URL: [email or web address] Date: 06/24/2023 Prepared by: Delon Haddock  Exercises - Clamshell  - 1 x daily - 7 x weekly - 2 sets - 10 reps - Hooklying Clamshell with Resistance  - 1 x  daily - 7 x weekly - 2 sets - 10 reps - Sidelying Hip Abduction  - 1 x daily - 7 x weekly - 2 sets - 10 reps - Supine Hamstring Stretch with Strap  - 1 x daily - 7 x weekly - 1 sets - 3 reps - Supine ITB Stretch with Strap  -  1 x daily - 7 x weekly - 1 sets - 3 reps  ASSESSMENT:  CLINICAL IMPRESSION: Patient continues to demonstrate extreme weakness in both hips but her pain level is much improved.  She reported 0/10 pain today.  New exercises added today and printouts provided.  Forgot to issue theraband.  We will issue next visit.  So she won't be able to do those exercises.   She would benefit from continuing skilled PT for focus on hip strengthening, LE flexibility, core strengthening, and manual techniques including dry needling, if indicated and patient agrees, to address below impairments.   OBJECTIVE IMPAIRMENTS: decreased ROM, decreased strength, hypomobility, increased fascial restrictions, increased muscle spasms, impaired flexibility, impaired sensation, improper body mechanics, postural dysfunction, obesity, and pain.   ACTIVITY LIMITATIONS: carrying, lifting, bending, sitting, standing, squatting, sleeping, stairs, transfers, bed mobility, bathing, dressing, reach over head, hygiene/grooming, and caring for others  PARTICIPATION LIMITATIONS: meal prep, cleaning, laundry, driving, shopping, community activity, occupation, yard work, and church  PERSONAL FACTORS: Fitness, Profession, and 1-2 comorbidities: post weight loss sx, depression  are also affecting patient's functional outcome.   REHAB POTENTIAL: Good  CLINICAL DECISION MAKING: Stable/uncomplicated  EVALUATION COMPLEXITY: Low   GOALS: Goals reviewed with patient? Yes  SHORT TERM GOALS: Target date: 06/26/2023  Pain report to be no greater than 4/10  Baseline: Goal status: INITIAL  2.  Patient will be independent with initial HEP  Baseline:  Goal status: INITIAL   LONG TERM GOALS: Target date:  07/24/2023  Patient to report pain no greater than 2/10  Baseline:  Goal status: INITIAL  2.  Patient to be independent with advanced HEP  Baseline:  Goal status: INITIAL  3.  Patient to be able to sleep through the night  Baseline:  Goal status: INITIAL  4.  Patient to be able to resume work Baseline:  Goal status: INITIAL  5.  Patient to be able to bend, stoop and squat with pain no greater than 2/10  Baseline:  Goal status: INITIAL  6.  Patient to report 85% improvement in overall symptoms  Baseline:  Goal status: INITIAL   PLAN:  PT FREQUENCY: 1-2x/week  PT DURATION: 8 weeks  PLANNED INTERVENTIONS: 97110-Therapeutic exercises, 97530- Therapeutic activity, [id number]- Neuromuscular re-education, 97535- Self Care, 02859- Manual therapy, [id number]- Gait training, [phone]- Aquatic Therapy, [phone]- Splinting, 97014- Electrical stimulation (unattended), 97016- Vasopneumatic device, [id number]- Ultrasound, [id number]- Traction (mechanical), [id number]- Ionotophoresis 4mg /ml Dexamethasone , Patient/Family education, Stair training, Taping, Dry Needling, Joint mobilization, Scar mobilization, Vestibular training, Visual/preceptual remediation/compensation, DME instructions, Cryotherapy, and Moist heat  PLAN FOR NEXT SESSION:  Nustep, focus on hip strength,  LE resistive training   Ebon Ketchum B. Alexiya Franqui, PT 06/24/23 1:39 PM [facility] [address] [place], [place] [address] Phone # [phone] Fax [phone]

## 2023-06-27 ENCOUNTER — Telehealth: Payer: Self-pay

## 2023-06-27 ENCOUNTER — Ambulatory Visit: Payer: Commercial Managed Care - PPO

## 2023-06-27 NOTE — Telephone Encounter (Signed)
Patient did not show for appt.  Phone call placed to patient.  No answer but left voice mail.  Explained we were calling to check in with her to be sure she was ok and to remind her of her missed appt.  Explained our cancellation and no show policy and requested that patient call to confirm that she will be here for her next appt.  Provided date and time of next appt.

## 2023-07-01 ENCOUNTER — Telehealth: Payer: Self-pay

## 2023-07-01 ENCOUNTER — Ambulatory Visit: Payer: Commercial Managed Care - PPO

## 2023-07-01 NOTE — Telephone Encounter (Signed)
Patient did not show for 2nd time.  Phone call had been placed last no show and cancellation / no show policy was explained.  Left message to let patient know we wanted to be sure she was doing ok and to call and let us know if she plans on returning for therapy.  Explained that we will go ahead and remove the remaining visits and she will need to call and schedule one appt at a time.  Provided phone number to call us back.

## 2023-08-04 ENCOUNTER — Other Ambulatory Visit: Payer: Self-pay | Admitting: Obstetrics and Gynecology

## 2023-08-04 DIAGNOSIS — N6310 Unspecified lump in the right breast, unspecified quadrant: Secondary | ICD-10-CM

## 2023-08-06 ENCOUNTER — Ambulatory Visit: Payer: Commercial Managed Care - PPO

## 2023-08-12 ENCOUNTER — Ambulatory Visit
Admission: RE | Admit: 2023-08-12 | Discharge: 2023-08-12 | Disposition: A | Discharge status: Home / Self Care | Payer: Commercial Managed Care - PPO | Source: Ambulatory Visit | Attending: Obstetrics and Gynecology | Admitting: Obstetrics and Gynecology

## 2023-08-12 DIAGNOSIS — N6310 Unspecified lump in the right breast, unspecified quadrant: Secondary | ICD-10-CM

## 2023-12-26 ENCOUNTER — Encounter: Payer: Self-pay | Admitting: Advanced Practice Midwife

## 2024-02-11 ENCOUNTER — Ambulatory Visit (INDEPENDENT_AMBULATORY_CARE_PROVIDER_SITE_OTHER): Admitting: Podiatry

## 2024-02-11 DIAGNOSIS — Z91199 Patient's noncompliance with other medical treatment and regimen due to unspecified reason: Secondary | ICD-10-CM

## 2024-02-11 NOTE — Progress Notes (Signed)
 No show
